# Patient Record
Sex: Female | Born: 1955 | Race: Black or African American | Hispanic: No | Smoking: Current every day smoker
Health system: Southern US, Community
[De-identification: ages and names within clinical notes are randomized; demographics above are authoritative.]

## PROBLEM LIST (undated history)

## (undated) ENCOUNTER — Emergency Department (HOSPITAL_COMMUNITY): Admission: EM | Discharge status: Absent without leave | Payer: No Typology Code available for payment source

## (undated) DIAGNOSIS — I639 Cerebral infarction, unspecified: Secondary | ICD-10-CM

## (undated) DIAGNOSIS — I509 Heart failure, unspecified: Secondary | ICD-10-CM

## (undated) DIAGNOSIS — I219 Acute myocardial infarction, unspecified: Secondary | ICD-10-CM

## (undated) DIAGNOSIS — R011 Cardiac murmur, unspecified: Secondary | ICD-10-CM

## (undated) DIAGNOSIS — I10 Essential (primary) hypertension: Secondary | ICD-10-CM

## (undated) DIAGNOSIS — E785 Hyperlipidemia, unspecified: Secondary | ICD-10-CM

## (undated) HISTORY — DX: Essential (primary) hypertension: I10

## (undated) HISTORY — DX: Acute myocardial infarction, unspecified: I21.9

## (undated) HISTORY — DX: Hyperlipidemia, unspecified: E78.5

## (undated) HISTORY — PX: ABDOMINAL HYSTERECTOMY: SHX81

## (undated) HISTORY — PX: OTHER SURGICAL HISTORY: SHX169

---

## 1995-09-16 HISTORY — PX: OTHER SURGICAL HISTORY: SHX169

## 1998-05-09 ENCOUNTER — Encounter: Admission: RE | Admit: 1998-05-09 | Discharge: 1998-05-09 | Payer: Self-pay | Admitting: Family Medicine

## 1999-04-10 ENCOUNTER — Encounter: Admission: RE | Admit: 1999-04-10 | Discharge: 1999-04-10 | Payer: Self-pay | Admitting: Family Medicine

## 1999-07-17 ENCOUNTER — Encounter: Admission: RE | Admit: 1999-07-17 | Discharge: 1999-07-17 | Payer: Self-pay | Admitting: Family Medicine

## 2000-01-30 ENCOUNTER — Encounter: Admission: RE | Admit: 2000-01-30 | Discharge: 2000-01-30 | Payer: Self-pay | Admitting: Family Medicine

## 2000-06-05 ENCOUNTER — Encounter: Admission: RE | Admit: 2000-06-05 | Discharge: 2000-06-05 | Payer: Self-pay | Admitting: Family Medicine

## 2000-10-23 ENCOUNTER — Encounter: Admission: RE | Admit: 2000-10-23 | Discharge: 2000-10-23 | Payer: Self-pay | Admitting: Family Medicine

## 2001-01-26 ENCOUNTER — Encounter: Admission: RE | Admit: 2001-01-26 | Discharge: 2001-01-26 | Payer: Self-pay | Admitting: Family Medicine

## 2002-05-04 ENCOUNTER — Encounter: Admission: RE | Admit: 2002-05-04 | Discharge: 2002-05-04 | Payer: Self-pay | Admitting: Family Medicine

## 2003-07-07 ENCOUNTER — Encounter: Admission: RE | Admit: 2003-07-07 | Discharge: 2003-07-07 | Payer: Self-pay | Admitting: Family Medicine

## 2003-07-14 ENCOUNTER — Encounter: Admission: RE | Admit: 2003-07-14 | Discharge: 2003-07-14 | Payer: Self-pay | Admitting: Family Medicine

## 2004-01-23 ENCOUNTER — Encounter: Admission: RE | Admit: 2004-01-23 | Discharge: 2004-01-23 | Payer: Self-pay | Admitting: Sports Medicine

## 2004-02-08 ENCOUNTER — Encounter: Admission: RE | Admit: 2004-02-08 | Discharge: 2004-02-08 | Payer: Self-pay | Admitting: Sports Medicine

## 2004-02-15 ENCOUNTER — Encounter: Admission: RE | Admit: 2004-02-15 | Discharge: 2004-02-15 | Payer: Self-pay | Admitting: Sports Medicine

## 2005-09-26 ENCOUNTER — Emergency Department (HOSPITAL_COMMUNITY): Admission: EM | Admit: 2005-09-26 | Discharge: 2005-09-26 | Payer: Self-pay | Admitting: Family Medicine

## 2005-11-12 ENCOUNTER — Ambulatory Visit: Payer: Self-pay | Admitting: Family Medicine

## 2006-11-12 DIAGNOSIS — Z8673 Personal history of transient ischemic attack (TIA), and cerebral infarction without residual deficits: Secondary | ICD-10-CM | POA: Insufficient documentation

## 2006-11-12 DIAGNOSIS — F17200 Nicotine dependence, unspecified, uncomplicated: Secondary | ICD-10-CM

## 2006-11-12 DIAGNOSIS — E785 Hyperlipidemia, unspecified: Secondary | ICD-10-CM

## 2006-11-12 DIAGNOSIS — I6789 Other cerebrovascular disease: Secondary | ICD-10-CM | POA: Insufficient documentation

## 2006-11-12 DIAGNOSIS — F329 Major depressive disorder, single episode, unspecified: Secondary | ICD-10-CM

## 2006-11-19 ENCOUNTER — Telehealth (INDEPENDENT_AMBULATORY_CARE_PROVIDER_SITE_OTHER): Payer: Self-pay | Admitting: *Deleted

## 2006-11-20 ENCOUNTER — Ambulatory Visit: Payer: Self-pay | Admitting: Family Medicine

## 2006-11-20 DIAGNOSIS — I10 Essential (primary) hypertension: Secondary | ICD-10-CM | POA: Insufficient documentation

## 2007-01-14 ENCOUNTER — Encounter: Payer: Self-pay | Admitting: Family Medicine

## 2007-01-14 ENCOUNTER — Ambulatory Visit: Payer: Self-pay | Admitting: Family Medicine

## 2007-01-14 LAB — CONVERTED CEMR LAB
BUN: 11 mg/dL (ref 6–23)
CO2: 25 meq/L (ref 19–32)
Direct LDL: 170 mg/dL — ABNORMAL HIGH
Glucose, Bld: 153 mg/dL — ABNORMAL HIGH (ref 70–99)
Potassium: 3.3 meq/L — ABNORMAL LOW (ref 3.5–5.3)
Sodium: 142 meq/L (ref 135–145)

## 2007-01-15 ENCOUNTER — Encounter: Payer: Self-pay | Admitting: Family Medicine

## 2007-07-20 ENCOUNTER — Ambulatory Visit: Payer: Self-pay | Admitting: Family Medicine

## 2007-07-29 ENCOUNTER — Telehealth (INDEPENDENT_AMBULATORY_CARE_PROVIDER_SITE_OTHER): Payer: Self-pay | Admitting: Family Medicine

## 2008-06-02 ENCOUNTER — Emergency Department (HOSPITAL_COMMUNITY): Admission: EM | Admit: 2008-06-02 | Discharge: 2008-06-02 | Payer: Self-pay | Admitting: Family Medicine

## 2008-06-15 ENCOUNTER — Ambulatory Visit: Payer: Self-pay | Admitting: Family Medicine

## 2008-06-15 DIAGNOSIS — E663 Overweight: Secondary | ICD-10-CM | POA: Insufficient documentation

## 2008-06-19 ENCOUNTER — Telehealth (INDEPENDENT_AMBULATORY_CARE_PROVIDER_SITE_OTHER): Payer: Self-pay | Admitting: *Deleted

## 2008-12-21 ENCOUNTER — Ambulatory Visit: Payer: Self-pay | Admitting: Family Medicine

## 2008-12-21 ENCOUNTER — Encounter (INDEPENDENT_AMBULATORY_CARE_PROVIDER_SITE_OTHER): Payer: Self-pay | Admitting: Family Medicine

## 2008-12-21 LAB — CONVERTED CEMR LAB
Albumin: 4.2 g/dL (ref 3.5–5.2)
BUN: 14 mg/dL (ref 6–23)
CO2: 28 meq/L (ref 19–32)
Calcium: 9.5 mg/dL (ref 8.4–10.5)
Chloride: 102 meq/L (ref 96–112)
Cholesterol: 191 mg/dL (ref 0–200)
Creatinine, Ser: 1.02 mg/dL (ref 0.40–1.20)
Glucose, Bld: 109 mg/dL — ABNORMAL HIGH (ref 70–99)
HDL: 29 mg/dL — ABNORMAL LOW (ref >39)
Potassium: 3.3 meq/L — ABNORMAL LOW (ref 3.5–5.3)
Total CHOL/HDL Ratio: 6.6
Triglycerides: 174 mg/dL — ABNORMAL HIGH (ref <150)

## 2008-12-27 ENCOUNTER — Encounter (INDEPENDENT_AMBULATORY_CARE_PROVIDER_SITE_OTHER): Payer: Self-pay | Admitting: Family Medicine

## 2009-03-26 ENCOUNTER — Encounter: Payer: Self-pay | Admitting: Family Medicine

## 2009-04-30 ENCOUNTER — Telehealth: Payer: Self-pay | Admitting: Family Medicine

## 2009-05-11 ENCOUNTER — Ambulatory Visit: Payer: Self-pay | Admitting: Family Medicine

## 2009-05-11 DIAGNOSIS — E119 Type 2 diabetes mellitus without complications: Secondary | ICD-10-CM

## 2009-05-11 DIAGNOSIS — E1165 Type 2 diabetes mellitus with hyperglycemia: Secondary | ICD-10-CM | POA: Insufficient documentation

## 2009-05-11 LAB — CONVERTED CEMR LAB: Hgb A1c MFr Bld: 7.4 %

## 2009-05-15 ENCOUNTER — Telehealth: Payer: Self-pay | Admitting: *Deleted

## 2009-06-07 ENCOUNTER — Encounter: Payer: Self-pay | Admitting: Family Medicine

## 2009-09-05 ENCOUNTER — Telehealth: Payer: Self-pay | Admitting: *Deleted

## 2009-09-24 ENCOUNTER — Encounter: Payer: Self-pay | Admitting: Family Medicine

## 2009-10-10 ENCOUNTER — Inpatient Hospital Stay (HOSPITAL_COMMUNITY): Admission: EM | Admit: 2009-10-10 | Discharge: 2009-10-14 | Payer: Self-pay | Admitting: Family Medicine

## 2009-10-10 ENCOUNTER — Ambulatory Visit: Payer: Self-pay | Admitting: Family Medicine

## 2009-10-10 ENCOUNTER — Encounter: Payer: Self-pay | Admitting: Family Medicine

## 2009-10-10 ENCOUNTER — Encounter: Payer: Self-pay | Admitting: Emergency Medicine

## 2009-10-12 ENCOUNTER — Encounter: Payer: Self-pay | Admitting: Family Medicine

## 2009-10-16 ENCOUNTER — Telehealth: Payer: Self-pay | Admitting: Family Medicine

## 2009-10-17 ENCOUNTER — Encounter: Payer: Self-pay | Admitting: Family Medicine

## 2009-10-17 ENCOUNTER — Ambulatory Visit: Payer: Self-pay | Admitting: Family Medicine

## 2009-10-26 ENCOUNTER — Encounter: Payer: Self-pay | Admitting: Family Medicine

## 2009-10-30 ENCOUNTER — Ambulatory Visit: Payer: Self-pay | Admitting: Psychology

## 2009-11-01 ENCOUNTER — Telehealth: Payer: Self-pay | Admitting: *Deleted

## 2009-11-06 ENCOUNTER — Ambulatory Visit: Payer: Self-pay | Admitting: Family Medicine

## 2009-11-06 ENCOUNTER — Telehealth: Payer: Self-pay | Admitting: *Deleted

## 2009-11-19 ENCOUNTER — Encounter: Admission: RE | Admit: 2009-11-19 | Discharge: 2009-11-19 | Payer: Self-pay | Admitting: Family Medicine

## 2010-01-29 ENCOUNTER — Telehealth: Payer: Self-pay | Admitting: Family Medicine

## 2010-04-18 ENCOUNTER — Ambulatory Visit: Payer: Self-pay | Admitting: Family Medicine

## 2010-04-18 ENCOUNTER — Encounter: Payer: Self-pay | Admitting: Family Medicine

## 2010-04-18 LAB — CONVERTED CEMR LAB: Hgb A1c MFr Bld: 6.9 %

## 2010-04-20 LAB — CONVERTED CEMR LAB
BUN: 14 mg/dL (ref 6–23)
CO2: 30 meq/L (ref 19–32)
Calcium: 9.4 mg/dL (ref 8.4–10.5)
Chloride: 103 meq/L (ref 96–112)
Creatinine, Ser: 1.21 mg/dL — ABNORMAL HIGH (ref 0.40–1.20)
Glucose, Bld: 130 mg/dL — ABNORMAL HIGH (ref 70–99)
Potassium: 3.4 meq/L — ABNORMAL LOW (ref 3.5–5.3)
Sodium: 142 meq/L (ref 135–145)

## 2010-04-24 ENCOUNTER — Encounter: Payer: Self-pay | Admitting: Family Medicine

## 2010-04-24 ENCOUNTER — Ambulatory Visit: Payer: Self-pay | Admitting: Family Medicine

## 2010-04-24 ENCOUNTER — Telehealth: Payer: Self-pay | Admitting: Family Medicine

## 2010-04-24 LAB — CONVERTED CEMR LAB
BUN: 13 mg/dL (ref 6–23)
CO2: 26 meq/L (ref 19–32)
Calcium: 8.7 mg/dL (ref 8.4–10.5)
Chloride: 109 meq/L (ref 96–112)
Cholesterol: 91 mg/dL (ref 0–200)
Creatinine, Ser: 1.07 mg/dL (ref 0.40–1.20)
Glucose, Bld: 96 mg/dL (ref 70–99)
HDL: 26 mg/dL — ABNORMAL LOW (ref >39)
LDL Cholesterol: 53 mg/dL (ref 0–99)
Potassium: 3.4 meq/L — ABNORMAL LOW (ref 3.5–5.3)
Sodium: 143 meq/L (ref 135–145)
Total CHOL/HDL Ratio: 3.5
Triglycerides: 58 mg/dL (ref <150)
VLDL: 12 mg/dL (ref 0–40)

## 2010-04-28 ENCOUNTER — Encounter: Payer: Self-pay | Admitting: Family Medicine

## 2010-06-18 ENCOUNTER — Encounter: Payer: Self-pay | Admitting: Family Medicine

## 2010-10-09 ENCOUNTER — Telehealth: Payer: Self-pay | Admitting: Family Medicine

## 2010-10-17 NOTE — Assessment & Plan Note (Signed)
Summary: f/up,tcb   Vital Signs:  Patient profile:   55 year old female Weight:      180.1 pounds Temp:     97.9 degrees F oral Pulse rate:   55 / minute BP sitting:   121 / 84  (left arm)  Vitals Entered By: Alphia Kava (May 11, 2009 11:35 AM) CC: f/u BP Is Patient Diabetic? No   Primary Care Provider:  Helane Rima DO  CC:  f/u BP.  History of Present Illness: 55yo with h/o HTN, HLD, and h/o stroke.  1. HTN: Rx lisinopril, diltiazem, and maxzide. BP at goal. Denies CP, SOB, or leg swelling.    2.  H/O CVA: Rx ASA.  3.  HLD: states that she was told to take medication, but no Rx sent to pharmacy.   4.  Overweight: active, but does not dedicate time to exercise.  Daughter tries to encourage her.  5.  Tobacco abuse: 1ppd, not interested in quitting.  6. Health Maintenance: due for mammogram. previous BMPs with elevated glucose, will check A1c.  Habits & Providers  Alcohol-Tobacco-Diet     Tobacco Status: current     Cigarette Packs/Day: 1.0  Current Medications (verified): 1)  Maxzide 75-50 Mg Tabs (Triamterene-Hctz) .... Take 1 Tablet By Mouth Once A Day- Make Appointment With Your Doctor Before Any More Refills 2)  Taztia Xt 240 Mg  Cp24 (Diltiazem Hcl Er Beads) .Marland Kitchen.. 1 Tablet By Mouth Daily 3)  Lisinopril 10 Mg  Tabs (Lisinopril) .Marland Kitchen.. 1 Tab By Mouth Daily.  Please Schedule Appointment With Doctor For Blood Pressure Check. 4)  Anacin 81 Mg Tbec (Aspirin) .Marland Kitchen.. 1 Tablet By Mouth Daily 5)  Lovastatin 20 Mg Tabs (Lovastatin) .... Take 1 Tab By Mouth Daily  Allergies (verified): No Known Drug Allergies  Review of Systems General:  Denies chills, fever, loss of appetite, and malaise. CV:  Complains of fatigue; denies chest pain or discomfort, lightheadness, palpitations, shortness of breath with exertion, and swelling of feet. Resp:  Denies cough, shortness of breath, sputum productive, and wheezing.  Physical Exam  General:  Well-developed,well-nourished,in no  acute distress; alert,appropriate and cooperative throughout examination. Vital signs reviewed. Lungs:  Normal respiratory effort, chest expands symmetrically. Lungs are clear to auscultation, no crackles or wheezes. Heart:  Normal rate and regular rhythm. S1 and S2 normal without gallop, murmur, click, rub or other extra sounds. Pulses:  2 + dp Extremities:  no edema Psych:  Oriented X3, memory intact for recent and remote, normally interactive, and good eye contact.     Impression & Recommendations:  Problem # 1:  HYPERTENSION, BENIGN ESSENTIAL (ICD-401.1) Assessment Unchanged  At goal. Continue current treament. Her updated medication list for this problem includes:    Maxzide 75-50 Mg Tabs (Triamterene-hctz) .Marland Kitchen... Take 1 tablet by mouth once a day- make appointment with your doctor before any more refills    Taztia Xt 240 Mg Cp24 (Diltiazem hcl er beads) .Marland Kitchen... 1 tablet by mouth daily    Lisinopril 10 Mg Tabs (Lisinopril) .Marland Kitchen... 1 tab by mouth daily.  please schedule appointment with doctor for blood pressure check.  Orders: [facility]- Est  Level 4 ([address])  Problem # 2:  HYPERLIPIDEMIA (ICD-272.4) Assessment: Deteriorated  Restart Lovastatin. Recheck 6 months (patient refuses to come back in 3 months). Her updated medication list for this problem includes:    Lovastatin 20 Mg Tabs (Lovastatin) .Marland Kitchen... Take 1 tab by mouth daily  Orders: [facility]- Est  Level 4 ([address])  Problem # 3:  HYPERGLYCEMIA (ICD-790.29) Assessment: New Check A1c. Orders: A1C-[facility] ([address]) [facility]- Est  Level 4 ([address])  Problem # 4:  OVERWEIGHT (ICD-278.02) Assessment: Unchanged  Discussed benefit of exercise, healthy diet, and weight loss in controlling her chronic diseases. Encourage her to try Wii Fit with her family since they already use it at home.   Orders: [facility]- Est  Level 4 ([address])  Problem # 5:  CVA (ICD-436) Assessment: Unchanged  Continue ASA.  Orders: [facility]- Est  Level 4 ([address])  Problem # 6:   Preventive Health Care (ICD-V70.0) Assessment: Unchanged Encouraged Mammogram.  Problem # 7:  TOBACCO DEPENDENCE (ICD-305.1) Assessment: Unchanged Counseled to quit.  Precontemplative. Orders: [facility]- Est  Level 4 ([address])  Complete Medication List: 1)  Maxzide 75-50 Mg Tabs (Triamterene-hctz) .... Take 1 tablet by mouth once a day- make appointment with your doctor before any more refills 2)  Taztia Xt 240 Mg Cp24 (Diltiazem hcl er beads) .Marland Kitchen.. 1 tablet by mouth daily 3)  Lisinopril 10 Mg Tabs (Lisinopril) .Marland Kitchen.. 1 tab by mouth daily.  please schedule appointment with doctor for blood pressure check. 4)  Anacin 81 Mg Tbec (Aspirin) .Marland Kitchen.. 1 tablet by mouth daily 5)  Lovastatin 20 Mg Tabs (Lovastatin) .... Take 1 tab by mouth daily  Patient Instructions: 1)  It was very nice to meet you today! 2)  I will let you know if your A1c is abnormal. 3)  Please start the cholesterol medication that I have sent to your pharmacy. 4)  Get your mammogram! 5)  Follow up in 6 months. We will check your labs again at that time, so please come fasting (make your appoinment early in the morning and do no at breakfast).  Prescriptions: LOVASTATIN 20 MG TABS (LOVASTATIN) Take 1 tab by mouth daily  #90 x 3   Entered and Authorized by:   Helane Rima MD   Signed by:   Helane Rima MD on 05/11/2009   Method used:   Electronically to        [facility]  [phone]* (retail)       [address]       [place], [place]  [address]       Ph: [phone] or [phone]       Fax: [phone]   RxID:   [id number]    Prevention & Chronic Care Immunizations   Influenza vaccine: Fluvax Non-MCR  (06/15/2008)   Influenza vaccine due: Refused  (06/15/2008)    Tetanus booster: 01/18/2004: Done.   Tetanus booster due: 01/17/2014    Pneumococcal vaccine: Not documented  Colorectal Screening   Hemoccult: Not documented    Colonoscopy: Not documented   Colonoscopy due:  Refused  (06/15/2008)  Other Screening   Pap smear: hysterectomy for fibroids  (06/15/2008)   Pap smear due: Not Indicated    Mammogram: Done.  (01/18/2004)   Mammogram due: 01/17/2005   Smoking status: current  (05/11/2009)   Smoking cessation counseling: yes  (11/20/2006)  Lipids   Total Cholesterol: 191  (12/21/2008)   LDL: 127  (12/21/2008)   LDL Direct: 170  (01/14/2007)   HDL: 29  (12/21/2008)   Triglycerides: 174  (12/21/2008)    SGOT (AST): 18  (12/21/2008)   SGPT (ALT): 20  (12/21/2008)   Alkaline phosphatase: 73  (12/21/2008)   Total bilirubin: 0.5  (12/21/2008)    Lipid flowsheet reviewed?: Yes   Progress toward LDL goal: Improved  Hypertension   Last Blood Pressure:  121 / 84  (05/11/2009)   Serum creatinine: 1.02  (12/21/2008)   Serum potassium 3.3  (12/21/2008)    Hypertension flowsheet reviewed?: Yes   Progress toward BP goal: At goal  Self-Management Support :    Patient will work on the following items until the next clinic visit to reach self-care goals:     Eating: drink diet soda or water instead of juice or soda, eat more vegetables, use fresh or frozen vegetables, eat foods that are low in salt, eat baked foods instead of fried foods, eat fruit for snacks and desserts  (05/11/2009)     Activity: take a 30 minute walk every day, take the stairs instead of the elevator, park at the far end of the parking lot  (05/11/2009)    Hypertension self-management support: Written self-care plan  (05/11/2009)   Hypertension self-care plan printed.    Lipid self-management support: Written self-care plan  (05/11/2009)   Lipid self-care plan printed.   Laboratory Results   Blood Tests   Date/Time Received: May 11, 2009 12:02 PM  Date/Time Reported: May 11, 2009 12:18 PM   HGBA1C: 7.4%   (Normal Range: Non-Diabetic - 3-6%   Control Diabetic - 6-8%)  Comments: ...............test performed by......Marland KitchenBonnie A. [place], [place] (ASCP)      Appended  Document: f/up,tcb HgbA1c resulting in new diabetes diagnosis. Patient asked to schedule an office visit to discuss this new diagnosis.

## 2010-10-17 NOTE — Progress Notes (Signed)
Summary: re: meds refil/ts  Phone Note Call from Patient Call back at Home Phone [phone]   Caller: Patient Summary of Call: Pt says she recieved message from  office stating that Dr. Earlene Plater had called in some medication for her.  Pt not aware of anything that she was needing. Initial call taken by: Clydell Hakim,  November 01, 2009 9:54 AM  Follow-up for Phone Call        called pt back and informed, that dr.wallace sent refills to her pharmacy. Follow-up by: Arlyss Repress CMA,,  November 01, 2009 1:55 PM

## 2010-10-17 NOTE — Assessment & Plan Note (Signed)
Summary: Cath Report   [name], Suarez              ACCOUNT NO.:  [id number]      MEDICAL RECORD NO.:  [id number]          PATIENT TYPE:  INP      LOCATION:  2927                         FACILITY:  [facility]      PHYSICIAN:  Nicki Guadalajara, M.D.     DATE OF BIRTH:  Aug 16, 1956      DATE OF PROCEDURE:  10/11/2009   DATE OF DISCHARGE:                               CARDIAC CATHETERIZATION      PROCEDURE:  Cardiac catheterization.      OPERATOR:  Nicki Guadalajara, MD      INDICATIONS:  [name] is a 55 year old African American   female who has a history of hypertension and hyperlipidemia.  The   patient had a remote history of a CVA in 1997 and also history of brain   aneurysm status post repair in 1990.  She has history of hypertension,   tobacco use, and in August 2010 was diagnosed with diabetes mellitus.   She presented to [facility] yesterday with chest pain which had   awakened her from sleep the morning previously.  She was admitted to   [facility].  She was seen for initial consultation cardiac-wise   by Dr. Clarene Duke.  She subsequently has ruled in for non-STEMI with T-wave   inversion inferolaterally and a CPK at 367, MB 38.4, and troponin 6.2.   She was significantly hypokalemic and has required potassium repletion.   She is now referred for cardiac catheterization and possible   intervention.      DESCRIPTION OF PROCEDURE:  After premedication with Valium 1 mg   initially since the patient was sedated from the oral Valium, right   femoral artery was punctured anteriorly and a 5-French sheath was   inserted.  Diagnostic catheterization was done utilizing 5-French   Judkins 4 left and right coronary catheters.  A 5-French pigtail   catheter was used for biplane cine left ventriculography.  With her risk   factors, diabetes, and hypertension, distal aortography was also   performed.      With the demonstration of a totally occluded RCA in the region of  the   crux with very faint visualization distally, a decision was made to   attempt intervention.  It was felt that this was potentially a small   vessel and in light of her history of cerebral aneurysm as well as CVA,   she may not be a candidate for long-term Plavix therapy.  The arterial   sheath was upgraded to a 6-French system.  She was given only 300 mg of   Plavix because of the CVA history and was started on bivalirudin.   Initially, an FR-4 with side hole 6-French catheter was inserted but due   to the anterior takeoff of the RCA, this was changed to a 3DR 6-French   catheter with side holes.  After documentation of therapeutic   anticoagulation, a Prowater wire was advanced down the RCA.  The patient   was also started on IV nitroglycerin due to hypertension.  Initially,   the wire was unable to cross the stenosis with an occlusion on its own.   Consequently, a 2.0 x 12 mm apex balloon was inserted.  This did provide   some support and ultimately the wire was able to cross the total   occlusion and was advanced down a smaller PDA vessel.  Multiple   dilatations were made with this 2-0 balloon.  This did open up the flow   down the PDA, but it was apparent that the RCA was still occluded just   after the PDA takeoff.  The wire was then pulled back into this area and   actually the wire initially seemed to go into a distal continuation   branch.  The 2-0 balloon was then advanced to this site of total   occlusion but this was not able to cross the total occlusion.  Low level   inflations were made just proximal to the occlusion and then it became   apparent that there was a third larger branch in between the PDA and the   continuation branch which was a larger size PDA segment.  The wire was   ultimately able to cross this occluded segment into this larger PDA or   inferior LV branch and dilatation was made at the subtotal stenosis in   this segment.  A second wire was then  advanced and ultimately placed   down the initial PDA vessel.  The 2-0 balloon was then removed and a 2.5   x 20 mm Voyager balloon was inserted with dilatation proximal to the   bifurcation of the distal RCA to cover the long areas of narrowing that   were high grade.  Several long inflations were made at 3, 4, and 5   atmospheres.  It was then apparent that there was still the occluded   segment in the distal RCA.  A third wire was then inserted, this being a   whisper wire which ultimately was able to cross the total occlusion.  A   2-0 balloon was then reinserted but again this was unable to cross the   total occlusion.  This balloon was then removed and a 1.5 x 12 mm   balloon was inserted with dilatation at this site.  This did result in   some antegrade flow down the smaller continuation branch, PLA like   system.  Again, there was some narrowing in the acute margin crux   proximal to the now apparent trifurcation and the 2.5 balloon was then   reinserted for long inflations.  Due to the very long lesion length,   smaller vessel size, and the concern for long-term Plavix in this   patient with prior cerebral aneurysm and CVA, the decision was made not   to stent this segment but just to balloon angioplasty.  On completion of   the procedure, the RCA was opened and the tandem 100% occlusions in the   crux were reduced to less than 40%.  The PDA 100% occlusion was reduced   to 10-20%.  There was very distal narrowing in very small distal portion   of this PDA which was too small to intervene upon.  The larger second   PDA or the inferiorly branch had 99-100% occlusion being reduced to 0%.   The continuation branch site of occlusion still remained narrowed and   was small-caliber but with some antegrade flow now and had residual   narrowing of approximately 80% at the  site of occlusion.  There was   careful attention made not to dissect the vessel.  At the completion of   the  procedure, the patient was pain-free and had stable hemodynamics.   During the procedure, she received numerous doses of intracoronary   nitroglycerin and IV nitroglycerin was titrated up to 40 mcg.      HEMODYNAMIC DATA:  Central aortic pressure was 166/89.  Left ventricular   pressure 166/10.      ANGIOGRAPHIC DATA:  Left main coronary artery was angiographically   normal and bifurcated into an LAD and left circumflex system.      The LAD was lumpy bumpy proximally in its mid segment with narrowing of   30%.  The vessels reached the LV apex and supplied the distal portion of   the inferior wall.      The circumflex was somewhat tortuous and had proximal 30% narrowing   before the OM-1 vessel.  There was 30% narrowing in the proximal   circumflex.      The right coronary artery was totally occluded after an acute marginal   branch.      Biplane cine left ventriculography revealed preserved global   contractility with only a small focal region of mid inferior   hypocontractility on the RAO projection and low-to-mid posterolateral   hypocontractility on the LAO projection.      Distal aortography revealed patent renal arteries without evidence for   aortic aneurysm.  There did appear to be mild dilatation of the iliacs   bilaterally.      Following long complex 3 wire PTCA with ultimate opening of this   diffusely diseased distal RCA into 3 branches, the subtotal and   subsequent total portion in the acute margin and beyond was reduced to   40%.  The vessel then gave rise to a first PDA which was initially 100%   occluded and was reduced to 10%.  The  second larger PDA where it was   100% occluded reduced to 10% and a small continuation branch which was   100% occluded and opened partially with residual narrowing in the small   segment of at least 80%.      IMPRESSION:   1. Preserved global contractility with focal mid inferior and low-to-       mid posterolateral  hypocontractility in this patient status post       non-ST elevation myocardial infarction and inferolateral wall       myocardial infarction.   2. Proximal-to-mid irregularity of the left anterior descending of 30%       and 30% mid left anterior descending stenosis.   3. A 30-40% proximal circumflex and obtuse marginal 1 stenosis.   4. 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 in the       region of the acute margin treated with ultimate percutaneous       transluminal coronary angioplasty alone with simultaneous       ultimately 3 wires inserted and 99-100% occlusions in the right       coronary artery in the region of the acute margin and proximal to       the distal trifurcation from 99-100% to 30-40%, the 100% posterior       descending artery occlusion to 10%, the 100% posterior descending       artery 2 or larger vessel occlusion being reduced from 100% to 10%,       and the continuation  branch 100% occlusion still with residual       narrowing of 80% in a small-caliber vessel.      RECOMMENDATIONS:  Medical therapy with aggressive statin therapy, risk   factor modification, and smoking cessation.                  ______________________________   Nicki Guadalajara, M.D.            TK/MEDQ  D:  10/11/2009  T:  10/12/2009  Job:  [id number]

## 2010-10-17 NOTE — Progress Notes (Signed)
Summary: Medication Change       New/Updated Medications: BUDEPRION XL 300 MG XR24H-TAB (BUPROPION HCL) one by mouth each am Prescriptions: BUDEPRION XL 300 MG XR24H-TAB (BUPROPION HCL) one by mouth each am  #30 x 3   Entered and Authorized by:   Helane Rima DO   Signed by:   Helane Rima DO on 04/24/2010   Method used:   Electronically to        [facility] [address] (retail)       [phone]       [place], [place]  [address]       Ph: [phone]       Fax: [phone]   RxID:   [id number]

## 2010-10-17 NOTE — Letter (Signed)
Summary: Generic Letter  [facility]  [address]   [place], [place] [address]   Phone: [phone]  Fax: [phone]    04/28/2010  LAYTOYA ION [address] [place], [place]  [address]  Dear Ms. Dwiggins,  I am happy to tell you that your recent labs were all normal, with the exception of a low HDL cholesterol. This is your "good" cholesterol and we want that number to be high for heart protection. To raise your good cholesterol, I recommend that you stop smoking. Let me know if you have any questions or concerns.   Sincerely,   Helane Rima DO  Appended Document: Generic Letter mailed

## 2010-10-17 NOTE — Assessment & Plan Note (Signed)
Summary: f/up,tcb   Vital Signs:  Patient profile:   55 year old female Height:      69.5 inches Weight:      175.6 pounds BMI:     25.65 Pulse rate:   70 / minute BP sitting:   140 / 83  (left arm) Cuff size:   regular  Vitals Entered By: Arlyss Repress CMA, (April 18, 2010 10:00 AM) CC: refill all meds, HTN, HLD, DM, tobacco Is Patient Diabetic? No Pain Assessment Patient in pain? no        Primary Care Provider:  Helane Rima DO  CC:  refill all meds, HTN, HLD, DM, and tobacco.  History of Present Illness: 55 year old AAF:  1. HTN: Rx Lisinopril, Triamterene. Denies CP, SOB, N/V/D, LE edema, HA, dizziness.  2. HLD: Rx Crestor. Denies myalgias.  3. DM: New Dx. A1c = 7.4 to 6.9 today. Rx Metformin. Denies GI c/o.  4. Tobacco Abuse: Still smoking 1/2 ppd. Working on quitting with Smoking Cessation Class. Now on Wellbutrin.  5. CAD: recent NSTEMI.  Angioplasty was performed by cardiology. The patient's medications were reviewed and changed by cardiology. The patient is followed by [facility] - Dr. Clarene Duke.  Habits & Providers  Alcohol-Tobacco-Diet     Tobacco Status: current     Tobacco Counseling: to quit use of tobacco products     Cigarette Packs/Day: 0.5  Comments: on meds, but not working  Current Medications (verified): 1)  Lisinopril 20 Mg Tabs (Lisinopril) .Marland Kitchen.. 1 Tablet By Mouth Daily 2)  Aspirin 325 Mg Tabs (Aspirin) .... Take 1 Tab By Mouth Every Day 3)  Plavix 75 Mg Tabs (Clopidogrel Bisulfate) .... Take 1 Tab By Mouth Daily 4)  Nitrostat 0.4 Mg Subl (Nitroglycerin) .... One Sl At First Sign of Chest Pain, May Repeat Every Five Minutes X 2 5)  Crestor 20 Mg Tabs (Rosuvastatin Calcium) .Marland Kitchen.. 1 Tablet By Mouth Daily 6)  Dyrenium 50 Mg Caps (Triamterene) .... One By Mouth Daily 7)  Glucophage Xr 500 Mg Xr24h-Tab (Metformin Hcl) .... 2 By Mouth Daily 8)  Bupropion Hcl 150 Mg Xr12h-Tab (Bupropion Hcl) .... Take 1 Tablet Daily For 3  Days, Then Start Taking 1 Tablet Twice Daily.  Allergies (verified): No Known Drug Allergies  Past History:  Past Medical History: Last updated: 10/17/2009  1. CVA in 1997.   2. History of brain aneurysm status post repair in 1990.   3. Hypertension.   4. Hyperlipidemia.   5. New diagnosis of diabetes mellitus type 2 in August of 2010 with hemoglobin A1c of 7.4.   6. Tobacco abuse.   Social History: Packs/Day:  0.5  Review of Systems      See HPI  Physical Exam  General:  Well-developed,well-nourished,in no acute distress; alert,appropriate and cooperative throughout examination. Vitals reviewed. Lungs:  Normal respiratory effort, chest expands symmetrically. Lungs are clear to auscultation, no crackles or wheezes. Heart:  RRR 2/6 SM. Pulses:  2+ DP. Extremities:  No LE edema or cyanosis.  Psych:  Oriented X3, memory intact for recent and remote, and normally interactive.     Impression & Recommendations:  Problem # 1:  HYPERTENSION, BENIGN ESSENTIAL (ICD-401.1) Assessment Unchanged Amlodipine recently DC's by Cardiology. Will check K, Cr. Her updated medication list for this problem includes:    Lisinopril 20 Mg Tabs (Lisinopril) .Marland Kitchen... 1 tablet by mouth daily    Dyrenium 50 Mg Caps (Triamterene) ..... One by mouth daily  Orders: Basic Met-[facility] [phone])  [facility]- Est  Level 4 ([address])Future Orders: Lipid-[facility] ([id number]) ... 04/25/2011  Problem # 2:  HYPERLIPIDEMIA (ICD-272.4) Assessment: Unchanged  Her updated medication list for this problem includes:    Crestor 20 Mg Tabs (Rosuvastatin calcium) .Marland Kitchen... 1 tablet by mouth daily  Orders: [facility]- Est  Level 4 ([address])Future Orders: Lipid-[facility] ([id number]) ... 04/25/2011  Problem # 3:  DIABETES MELLITUS, TYPE II (ICD-250.00) Assessment: Improved  Her updated medication list for this problem includes:    Lisinopril 20 Mg Tabs (Lisinopril) .Marland Kitchen... 1 tablet by mouth daily    Aspirin 325 Mg Tabs (Aspirin) .Marland Kitchen... Take 1  tab by mouth every day    Glucophage Xr 500 Mg Xr24h-tab (Metformin hcl) .Marland Kitchen... 2 by mouth daily  Orders: A1C-[facility] ([address]) [facility]- Est  Level 4 ([address]86)  Problem # 4:  TOBACCO DEPENDENCE (ICD-305.1) Assessment: Unchanged  Advised to quit.  Orders: [facility]- Est  Level 4 ([address])  Complete Medication List: 1)  Lisinopril 20 Mg Tabs (Lisinopril) .Marland Kitchen.. 1 tablet by mouth daily 2)  Aspirin 325 Mg Tabs (Aspirin) .... Take 1 tab by mouth every day 3)  Plavix 75 Mg Tabs (Clopidogrel bisulfate) .... Take 1 tab by mouth daily 4)  Nitrostat 0.4 Mg Subl (Nitroglycerin) .... One sl at first sign of chest pain, may repeat every five minutes x 2 5)  Crestor 20 Mg Tabs (Rosuvastatin calcium) .Marland Kitchen.. 1 tablet by mouth daily 6)  Dyrenium 50 Mg Caps (Triamterene) .... One by mouth daily 7)  Glucophage Xr 500 Mg Xr24h-tab (Metformin hcl) .... 2 by mouth daily 8)  Bupropion Hcl 150 Mg Xr12h-tab (Bupropion hcl) .... Take 1 tablet daily for 3 days, then start taking 1 tablet twice daily.  Patient Instructions: 1)  It was nice to see today! Prescriptions: BUPROPION HCL 150 MG XR12H-TAB (BUPROPION HCL) Take 1 tablet daily for 3 days, then start taking 1 tablet twice daily.  #60 x 3   Entered and Authorized by:   Helane Rima DO   Signed by:   Helane Rima DO on 04/18/2010   Method used:   Electronically to        [facility] [address] (retail)       [phone]       [place], [place]  [address]       Ph: [phone]       Fax: [phone]   RxID:   [id number] NITROSTAT 0.4 MG SUBL (NITROGLYCERIN) one SL at first sign of chest pain, may repeat every five minutes x 2  #20 x 0   Entered and Authorized by:   Helane Rima DO   Signed by:   Helane Rima DO on 04/18/2010   Method used:   Electronically to        [facility] [address] (retail)       [phone]       [place], [place]  [address]       Ph: [phone]       Fax:  [phone]   RxID:   [id number] PLAVIX 75 MG TABS (CLOPIDOGREL BISULFATE) Take 1 tab by mouth daily  #30 x 1   Entered and Authorized by:   Helane Rima DO   Signed by:   Helane Rima DO on 04/18/2010   Method used:   Electronically to        [facility] [address] (retail)  [address]       [place], [place]  [address]       Ph: [phone]       Fax: [phone]   RxID:   [id number] LISINOPRIL 20 MG TABS (LISINOPRIL) 1 tablet by mouth daily  #30 x 1   Entered and Authorized by:   Helane Rima DO   Signed by:   Helane Rima DO on 04/18/2010   Method used:   Electronically to        [facility] [address] (retail)       [phone]       [place], [place]  [address]       Ph: [phone]       Fax: [phone]   RxID:   [id number] GLUCOPHAGE XR 500 MG XR24H-TAB (METFORMIN HCL) 2 by mouth daily  #60 x 3   Entered and Authorized by:   Helane Rima DO   Signed by:   Helane Rima DO on 04/18/2010   Method used:   Electronically to        [facility] [address] (retail)       [phone]       [place], [place]  [address]       Ph: [phone]       Fax: [phone]   RxID:   [id number] CRESTOR 20 MG TABS (ROSUVASTATIN CALCIUM) 1 tablet by mouth daily  #90 x 3   Entered and Authorized by:   Helane Rima DO   Signed by:   Helane Rima DO on 04/18/2010   Method used:   Electronically to        [facility] [address] (retail)       [phone]       [place], [place]  [address]       Ph: [phone]       Fax: [phone]   RxID:   [id number] DYRENIUM 50 MG CAPS (TRIAMTERENE) one by mouth daily  #30 x 3   Entered and Authorized by:   Helane Rima DO   Signed by:   Helane Rima DO on 04/18/2010   Method used:   Electronically to        [facility] [address] (retail)       [phone]       [place], [place]  [address]       Ph: [phone]       Fax: [phone]   RxID:   [id number]   Laboratory Results   Blood Tests   Date/Time Received: April 18, 2010 10:43 AM  Date/Time Reported: April 18, 2010 11:35 AM   HGBA1C: 6.9%   (Normal Range: Non-Diabetic - 3-6%   Control Diabetic - 6-8%)  Comments: ...............test performed by......Marland KitchenBonnie A. [place], MLS (ASCP)cm      Prevention & Chronic Care Immunizations   Influenza vaccine: Fluvax Non-MCR  (06/15/2008)   Influenza vaccine deferral: Not indicated  (10/28/2009)   Influenza vaccine due: 05/17/2011    Tetanus booster: 01/18/2004: Done.   Tetanus booster due: 01/17/2014    Pneumococcal vaccine: Not documented  Colorectal Screening   Hemoccult: Not documented    Colonoscopy: Not documented   Colonoscopy due: Refused  (06/15/2008)  Other Screening  Pap smear: hysterectomy for fibroids  (06/15/2008)   Pap smear due: Not Indicated    Mammogram: Done.  (01/18/2004)   Mammogram due: 01/17/2005   Smoking status: current  (04/18/2010)   Smoking cessation counseling: yes  (11/20/2006)   Target quit date: 11/14/2009  (11/06/2009)  Diabetes Mellitus   HgbA1C: 6.9  (04/18/2010)    Eye exam: Not documented    Foot exam: Not documented   High risk foot: Not documented   Foot care education: Not documented    Urine microalbumin/creatinine ratio: Not documented    Diabetes flowsheet reviewed?: Yes   Progress toward A1C goal: At goal  Lipids   Total Cholesterol: 191  (12/21/2008)   LDL: 127  (12/21/2008)   LDL Direct: 170  (01/14/2007)   HDL: 29  (12/21/2008)   Triglycerides: 174  (12/21/2008)    SGOT (AST): 18  (12/21/2008)   SGPT (ALT): 20  (12/21/2008)   Alkaline phosphatase: 73  (12/21/2008)   Total bilirubin: 0.5  (12/21/2008)    Lipid flowsheet reviewed?: Yes   Progress toward LDL goal: Unchanged  Hypertension   Last Blood Pressure: 140 /  83  (04/18/2010)   Serum creatinine: 1.02  (12/21/2008)   Serum potassium 3.3  (12/21/2008)    Hypertension flowsheet reviewed?: Yes   Progress toward BP goal: At goal  Self-Management Support :   Personal Goals (by the next clinic visit) :     Personal A1C goal: 6  (10/28/2009)     Personal blood pressure goal: 130/80  (10/28/2009)     Personal LDL goal: 70  (10/28/2009)    Patient will work on the following items until the next clinic visit to reach self-care goals:     Medications and monitoring: take my medicines every day, bring all of my medications to every visit  (04/18/2010)     Eating: drink diet soda or water instead of juice or soda, eat more vegetables, use fresh or frozen vegetables, eat foods that are low in salt, eat baked foods instead of fried foods, eat fruit for snacks and desserts, limit or avoid alcohol  (04/18/2010)     Activity: take a 30 minute walk every day, take the stairs instead of the elevator, park at the far end of the parking lot  (04/18/2010)    Diabetes self-management support: Written self-care plan  (04/18/2010)   Diabetes care plan printed    Hypertension self-management support: Written self-care plan  (04/18/2010)   Hypertension self-care plan printed.    Lipid self-management support: Written self-care plan  (04/18/2010)   Lipid self-care plan printed.

## 2010-10-17 NOTE — Progress Notes (Signed)
Summary: meds prob  Phone Note Call from Patient   Caller: Patient Summary of Call: states she was released from hosp on Sunday and they drugstore is trying to contact us about one of the meds - has a question about it.  [facility] [address]  Initial call taken by: De Nurse,  October 16, 2009 10:38 AM  Follow-up for Phone Call        lm Follow-up by: Golden Circle RN,  October 16, 2009 10:54 AM  Additional Follow-up for Phone Call Additional follow up Details #1::        states she did not get one of her rx because the mg were wrong.  Triamterene. to pcp to re-send in correct dose. Additional Follow-up by: Golden Circle RN,  October 17, 2009 3:21 PM    Additional Follow-up for Phone Call Additional follow up Details #2::    will d/w patient at visit today Follow-up by: Helane Rima DO,  October 21, 2009 9:46 AM

## 2010-10-17 NOTE — Assessment & Plan Note (Signed)
Summary: hospital follow-up   Vital Signs:  Patient profile:   55 year old female Height:      69.5 inches Weight:      178 pounds BMI:     26.00 Temp:     98.3 degrees F oral Pulse rate:   71 / minute BP sitting:   149 / 94  (right arm) Cuff size:   regular  Vitals Entered By: Tessie Fass CMA (October 17, 2009 4:30 PM) CC: Hospital Follow-Up Is Patient Diabetic? Yes Pain Assessment Patient in pain? no        Primary Care Provider:  Helane Rima DO  CC:  Hospital Follow-Up.  History of Present Illness: 55 year old AAF with recent NSTEMI.  Angioplasty was performed by cardiology. The patient's medications were reviewed and changed by cardiology. The patient will be followed by [facility].  1. HTN: Rx Lisinopril, Amlodipine.  2. HLD: Rx Crestor.  3. DM: New Dx. A1c = 7.4. Not on medication.  4. Tobacco Abuse: Still smoking, though down from 1 ppd to 3-4 cigs/day. Working on quitting.  Habits & Providers  Alcohol-Tobacco-Diet     Tobacco Status: current     Cigarette Packs/Day: <0.25  Current Medications (verified): 1)  Lisinopril 20 Mg Tabs (Lisinopril) .Marland Kitchen.. 1 Tablet By Mouth Daily 2)  Aspirin 325 Mg Tabs (Aspirin) .... Take 1 Tab By Mouth Every Day 3)  Amlodipine Besylate 5 Mg  Tabs (Amlodipine Besylate) .Marland Kitchen.. 1 By Mouth Every Day 4)  Plavix 75 Mg Tabs (Clopidogrel Bisulfate) .... Take 1 Tab By Mouth Daily 5)  Nitrostat 0.4 Mg Subl (Nitroglycerin) .... One Sl At First Sign of Chest Pain, May Repeat Every Five Minutes X 2 6)  Crestor 20 Mg Tabs (Rosuvastatin Calcium) .... One By Mouth Daily 7)  Dyrenium 50 Mg Caps (Triamterene) .... One By Mouth Daily - Prescribed By Cardiology  Allergies (verified): No Known Drug Allergies  Past History:  Past medical, surgical, family and social histories (including risk factors) reviewed for relevance to current acute and chronic problems.  Past Medical History:  1. CVA in 1997.   2. History of  brain aneurysm status post repair in 1990.   3. Hypertension.   4. Hyperlipidemia.   5. New diagnosis of diabetes mellitus type 2 in August of 2010 with hemoglobin A1c of 7.4.   6. Tobacco abuse.   Past Surgical History: Hysterectomy 2/2 Dysfunctional Uterine Bleeding 1993 Brain Aneurysm Repair 1990  Heart Catheterization 2010  Family History: Reviewed history from 10/10/2009 and no changes required. Mother - DM2, HTN Father - unknown  Sister - HTN 2 Brothers in good health No known h/o early MI in first degree relatives   Social History: Reviewed history from 10/10/2009 and no changes required. 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  Lives with 30yo daughter and granddaughter.  Divorced. Does not drink alcohol.  No h/o illicit drugs.  Does not exercise regularly - walks occasionally   Review of Systems General:  Denies chills, fever, and malaise. CV:  Denies chest pain or discomfort, palpitations, and swelling of feet. Resp:  Denies shortness of breath. GI:  Denies change in bowel habits. Neuro:  Denies headaches.  Physical Exam  General:  Well-developed,well-nourished,in no acute distress; alert,appropriate and cooperative throughout examination. Vitals reviewed. Lungs:  Normal respiratory effort, chest expands symmetrically. Lungs are clear to auscultation, no crackles or wheezes. Heart:  RRR 2/6 SM. Extremities:  No LE  edema or cyanosis.  Psych:  Oriented X3, memory intact for recent and remote, and normally interactive.     Impression & Recommendations:  Problem # 1:  HYPERTENSION, BENIGN ESSENTIAL (ICD-401.1) Assessment Unchanged  The following medications were removed from the medication list:    Maxzide 75-50 Mg Tabs (Triamterene-hctz) .Marland Kitchen... Take 1 tablet by mouth once a day- make appointment with your doctor before any more refills    Taztia Xt 240 Mg Cp24 (Diltiazem hcl er beads) .Marland Kitchen... 1 tablet by mouth daily Her updated  medication list for this problem includes:    Lisinopril 20 Mg Tabs (Lisinopril) .Marland Kitchen... 1 tablet by mouth daily    Amlodipine Besylate 5 Mg Tabs (Amlodipine besylate) .Marland Kitchen... 1 by mouth every day    Dyrenium 50 Mg Caps (Triamterene) ..... One by mouth daily - prescribed by cardiology  Orders: [facility]- Est  Level 4 ([address])  Problem # 2:  HYPERLIPIDEMIA (ICD-272.4) Assessment: Unchanged  The following medications were removed from the medication list:    Lovastatin 20 Mg Tabs (Lovastatin) .Marland Kitchen... Take 1 tab by mouth daily Her updated medication list for this problem includes:    Crestor 20 Mg Tabs (Rosuvastatin calcium) ..... One by mouth daily  Orders: [facility]- Est  Level 4 ([address])  Problem # 3:  DIABETES MELLITUS, TYPE II (ICD-250.00) Assessment: Unchanged  Her updated medication list for this problem includes:    Lisinopril 20 Mg Tabs (Lisinopril) .Marland Kitchen... 1 tablet by mouth daily    Aspirin 325 Mg Tabs (Aspirin) .Marland Kitchen... Take 1 tab by mouth every day    Glucophage Xr 500 Mg Xr24h-tab (Metformin hcl) ..... One by mouth daily x 2 weeks, then increase to 2 by mouth daily  Orders: [facility]- Est  Level 4 ([address])  Problem # 4:  TOBACCO DEPENDENCE (ICD-305.1) Assessment: Unchanged  Orders: [facility]- Est  Level 4 ([address]214)  Complete Medication List: 1)  Lisinopril 20 Mg Tabs (Lisinopril) .Marland Kitchen.. 1 tablet by mouth daily 2)  Aspirin 325 Mg Tabs (Aspirin) .... Take 1 tab by mouth every day 3)  Amlodipine Besylate 5 Mg Tabs (Amlodipine besylate) .Marland Kitchen.. 1 by mouth every day 4)  Plavix 75 Mg Tabs (Clopidogrel bisulfate) .... Take 1 tab by mouth daily 5)  Nitrostat 0.4 Mg Subl (Nitroglycerin) .... One sl at first sign of chest pain, may repeat every five minutes x 2 6)  Crestor 20 Mg Tabs (Rosuvastatin calcium) .... One by mouth daily 7)  Dyrenium 50 Mg Caps (Triamterene) .... One by mouth daily - prescribed by cardiology 8)  Glucophage Xr 500 Mg Xr24h-tab (Metformin hcl) .... One by mouth daily x 2 weeks, then increase  to 2 by mouth daily  Patient Instructions: 1)  It was nice to see today! Prescriptions: GLUCOPHAGE XR 500 MG XR24H-TAB (METFORMIN HCL) one by mouth daily x 2 weeks, then increase to 2 by mouth daily  #60 x 0   Entered and Authorized by:   Helane Rima DO   Signed by:   Helane Rima DO on 10/28/2009   Method used:   Electronically to        [facility]  [phone]* (retail)       [address]       [place], [place]  [address]       Ph: [phone] or [phone]       Fax: [phone]   RxID:   [id number] CRESTOR 20 MG TABS (ROSUVASTATIN CALCIUM) one by mouth daily  #  90 x 3   Entered and Authorized by:   Helane Rima DO   Signed by:   Helane Rima DO on 10/25/2009   Method used:   Electronically to        [facility]  [phone]* (retail)       [address]       [place], [place]  [address]       Ph: [phone] or [phone]       Fax: [phone]   RxID:   [id number] NITROSTAT 0.4 MG SUBL (NITROGLYCERIN) one SL at first sign of chest pain, may repeat every five minutes x 2  #20 x 0   Entered and Authorized by:   Helane Rima DO   Signed by:   Helane Rima DO on 10/25/2009   Method used:   Electronically to        [facility]  [phone]* (retail)       [address]       [place], [place]  [address]       Ph: [phone] or [phone]       Fax: [phone]   RxID:   [id number] LISINOPRIL 20 MG TABS (LISINOPRIL) 1 tablet by mouth daily  #90 x 3   Entered and Authorized by:   Helane Rima DO   Signed by:   Helane Rima DO on 10/25/2009   Method used:   Electronically to        [facility]  [phone]* (retail)       [address]       [place], [place]  [address]       Ph: [phone] or [phone]       Fax: [phone]   RxID:   [id number] AMLODIPINE BESYLATE 5 MG  TABS (AMLODIPINE BESYLATE) 1 by  mouth every day  #90 x 3   Entered and Authorized by:   Helane Rima DO   Signed by:   Helane Rima DO on 10/25/2009   Method used:   Electronically to        [facility]  [phone]* (retail)       [address]       [place], [place]  [address]       Ph: [phone] or [phone]       Fax: [phone]   RxID:   [id number]   Appended Document: hospital follow-up Please call this patient and let her know that I have her medication list now. I am adding a medication for her diabetes and have already sent it to her pharmacy.  Appended Document: hospital follow-up     Allergies: No Known Drug Allergies   Complete Medication List: 1)  Lisinopril 20 Mg Tabs (Lisinopril) .Marland Kitchen.. 1 tablet by mouth daily 2)  Aspirin 325 Mg Tabs (Aspirin) .... Take 1 tab by mouth every day 3)  Amlodipine Besylate 5 Mg Tabs (Amlodipine besylate) .Marland Kitchen.. 1 by mouth every day 4)  Plavix 75 Mg Tabs (Clopidogrel bisulfate) .... Take 1 tab by mouth daily 5)  Nitrostat 0.4 Mg Subl (Nitroglycerin) .... One sl at first sign of chest pain, may repeat every five minutes x 2 6)  Crestor 20 Mg Tabs (Rosuvastatin calcium) .... One by mouth daily 7)  Dyrenium 50 Mg Caps (Triamterene) .... One by mouth  daily - prescribed by cardiology 8)  Glucophage Xr 500 Mg Xr24h-tab (Metformin hcl) .... One by mouth daily x 2 weeks, then increase to 2 by mouth daily   Prevention & Chronic Care Immunizations   Influenza vaccine: Fluvax Non-MCR  (06/15/2008)   Influenza vaccine deferral: Not indicated  (10/28/2009)   Influenza vaccine due: 05/17/2011    Tetanus booster: 01/18/2004: Done.   Tetanus booster due: 01/17/2014    Pneumococcal vaccine: Not documented  Colorectal Screening   Hemoccult: Not documented    Colonoscopy: Not documented   Colonoscopy due: Refused  (06/15/2008)  Other Screening   Pap smear: hysterectomy for fibroids  (06/15/2008)   Pap smear due: Not Indicated     Mammogram: Done.  (01/18/2004)   Mammogram due: 01/17/2005   Smoking status: current  (10/17/2009)   Smoking cessation counseling: yes  (11/20/2006)  Diabetes Mellitus   HgbA1C: 7.4  (05/11/2009)    Eye exam: Not documented    Foot exam: Not documented   High risk foot: Not documented   Foot care education: Not documented    Urine microalbumin/creatinine ratio: Not documented    Diabetes flowsheet reviewed?: Yes   Progress toward A1C goal: Unchanged  Lipids   Total Cholesterol: 191  (12/21/2008)   LDL: 127  (12/21/2008)   LDL Direct: 170  (01/14/2007)   HDL: 29  (12/21/2008)   Triglycerides: 174  (12/21/2008)    SGOT (AST): 18  (12/21/2008)   SGPT (ALT): 20  (12/21/2008)   Alkaline phosphatase: 73  (12/21/2008)   Total bilirubin: 0.5  (12/21/2008)    Lipid flowsheet reviewed?: Yes   Progress toward LDL goal: Unchanged  Hypertension   Last Blood Pressure: 149 / 94  (10/17/2009)   Serum creatinine: 1.02  (12/21/2008)   Serum potassium 3.3  (12/21/2008)    Hypertension flowsheet reviewed?: Yes   Progress toward BP goal: Deteriorated  Self-Management Support :   Personal Goals (by the next clinic visit) :     Personal A1C goal: 6  (10/28/2009)     Personal blood pressure goal: 130/80  (10/28/2009)     Personal LDL goal: 70  (10/28/2009)    Patient will work on the following items until the next clinic visit to reach self-care goals:     Medications and monitoring: take my medicines every day  (10/28/2009)     Eating: drink diet soda or water instead of juice or soda, eat more vegetables, use fresh or frozen vegetables, eat foods that are low in salt, eat baked foods instead of fried foods, eat fruit for snacks and desserts, limit or avoid alcohol  (10/28/2009)     Activity: take a 30 minute walk every day, take the stairs instead of the elevator, park at the far end of the parking lot  (10/28/2009)    Diabetes self-management support: Written self-care plan   (10/28/2009)   Diabetes care plan printed    Hypertension self-management support: Written self-care plan  (10/28/2009)   Hypertension self-care plan printed.    Lipid self-management support: Written self-care plan  (10/28/2009)   Lipid self-care plan printed.  Appended Document: hospital follow-up called pt and lmvm to check with her pharmacy.

## 2010-10-17 NOTE — Assessment & Plan Note (Signed)
[facility]   Visit Type:  Hospital H+P Primary Care Provider:  Helane Rima DO  CC:  Chest Pain .  History of Present Illness: 65 F PMH sig. for HTN, HLD, p/w substernal heavy chest pain waking started this AM at 4:00, waking her from sleep. Pain resolved at around 5:30 AM, after SL NTG and NTG paste at [facility]. Pain was worse with lying down, better with sitting up and walking around; not affected by meals or by palpation. Has never had pain like this before. Initially felt weak in bilateral arms, now resolved. Reports mild nausea on presentation to [facility], now resolved. Denies numbness, tingling, radiation of pain, diaphoresis, dyspnea, LE edema, fever/chills, cough, emesis, presyncope, palpitations, dyspepsia, dysuria, abdominal pain. Smokes one pack per day since high school, does not exercise. No family history of early MI.   AT [facility] rec'd - NTG 0.4 mg SL x 1, NTG paste, KCl 40 meq x 1, ASA 324 mg x 1   Current Medications (verified): 1)  Maxzide 75-50 Mg Tabs (Triamterene-Hctz) .... Take 1 Tablet By Mouth Once A Day- Make Appointment With Your Doctor Before Any More Refills 2)  Taztia Xt 240 Mg  Cp24 (Diltiazem Hcl Er Beads) .Marland Kitchen.. 1 Tablet By Mouth Daily 3)  Lisinopril 10 Mg  Tabs (Lisinopril) .Marland Kitchen.. 1 Tab By Mouth Daily.  Please Schedule Appointment With Doctor For Blood Pressure Check. 4)  Anacin 81 Mg Tbec (Aspirin) .Marland Kitchen.. 1 Tablet By Mouth Daily 5)  Lovastatin 20 Mg Tabs (Lovastatin) .... 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  Lives with  30yo daughter and granddaughter.  Divorced.  Does not drink alcohol.  No h/o illicit drugs.  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  Lives with 30yo daughter and granddaughter.  Divorced.  Does not drink alcohol.  No h/o illicit drugs.  Does not exercise regularly - walks occasionally   Review of Systems       as per HPI Also negative for weight loss, LE edema, headache, syncope, melena, hematochezia, constipation, diarrhea, incontinence     Physical Exam  General:  overweight, appears older than stated age, NAD, sitting comfortably w/o pain in bed     VITALS:  BP 162/97, HR 54, RR 20, T 98.0, O2 96% on RA    Head:  NCAT  Eyes:  PERRL, EOM Nose:  no external deformity, no rhinorrhea  Mouth:  slightly dry mucus membranes, no posterior pharyngeal erythema or exudate  Neck:  no JVD, no lymphadenopathy , no carotid bruits  Lungs:  Normal respiratory effort, chest expands symmetrically. Lungs are clear  to auscultation, no crackles or wheezes. Heart:  Bradycardia to low 50's but regular rhythm. Grade  2/6 systolic ejection murmur(previously documented outpatient)  best at RUSB , does not radiate to carotids. NO JVD or carotid bruits. Abdomen:  soft, non tender, non distended, +BS, no organomegaly  Msk:  5/5 strength bilateral upper and lower extremities  Pulses:  2+ radials, regular but slow  Extremities:  no LE edema or cyanosis  Neurologic:  alert & oriented X3, cranial nerves II-XII intact, strength normal in all extremities, and  sensation intact to light touch.   Skin:  no rash, good turgor  Psych:  Oriented X3, memory intact for recent and remote, and normally interactive.   Additional Exam:  BMET: 140 / 2.7 / 101 / 31 / 11 / 0.91 < 196  CBC: 4.2 > 13.9 / 41.0 < 118  CXR: Cardiomegaly and mild pulmonary vascular congestion. Also w/ hyperaeration.  EKG: Sinus brady (rate 53), normal PR interval, normal axes, prolonged QT but w/ normal QTc, flipped T waves in diffuse leads, no ST elevation or depression   Impression & Recommendations:  Problem # 1:  Chest pain Substernal, relieved by NTG (but not by rest), non-exertional. Patient with multiple risk factors for CAD - DM2, HTN, HLD, tobacco. TIMI score = 2 (8% risk all-cause mortality, new or recurrent MI, or severe recurrent ischemia requiring urgent revascularization). EKG w/ sinus brady, diffuse T wave inversion - diffuse ischemia. Will repeat EKG now, if pain recurs. Initial CE negative approximately 1 hour post event. Will cycle CE x 2 sets 8 hrs apart. Will hold off on B-Blocker with bradycardia.  Will give morphine, NTG SL for chest pain. Will call cardiology at this time regarding risk stratification inpatient vs outpatient. Would consider Myoview.   Problem # 2:  DIABETES MELLITUS, TYPE II (ICD-250.00) A1C = 7.4 in 05/11/09. Meets ADA criteria for DM diagnosis. Will start SSI at thits time. Patient does not seem to be aware of this diagnosis. Will discuss with patient. Carb modified diet.   Her updated medication list for this problem includes:    Lisinopril 10 Mg Tabs (Lisinopril) .Marland Kitchen... 1 tab by mouth daily.      Anacin 81 Mg Tbec (Aspirin) .Marland Kitchen... 1 tablet by mouth daily  Labs Reviewed: Creat: 1.02 (12/21/2008)    Reviewed HgBA1c results: 7.4 (05/11/2009)  Problem # 3:  TOBACCO DEPENDENCE (ICD-305.1) Smoking cessation consult, although patient precontemplative at this time.   Problem # 4:  HYPERLIPIDEMIA (ICD-272.4) Will increase statin dose to 40 mg by  mouth qday. Check CMET, lipid panel. LFTs, lipid panel reviewed below.  Her updated medication list for this problem includes:    Lovastatin 20 Mg Tabs (Lovastatin) .Marland Kitchen... Take 1 tab by mouth daily  Labs Reviewed: SGOT: 18 (12/21/2008)   SGPT: 20 (12/21/2008)   HDL:29 (12/21/2008)  LDL:127 (12/21/2008)  Chol:191 (12/21/2008)  Trig:174 (12/21/2008)  Problem # 5:  CVA (ICD-436) Remote history of CVA. Will continue ASA, BP control at this time. Stable   Problem # 6:  HYPERTENSION, BENIGN ESSENTIAL (ICD-401.1)  Continue Maxzide. Hold off on beta-blocker for now given bradycardia. Hold lisinopril w/ potential for cath. Hold diltiazem with bradycardia. Hydralazine IV as needed.    Prior BP: 121/84 (05/11/2009)  Labs Reviewed: K+: 3.3 (12/21/2008) Creat: : 1.02 (12/21/2008)   Chol: 191 (12/21/2008)   HDL: 29 (12/21/2008)   LDL: 127 (12/21/2008)   TG: 174 (12/21/2008)  Problem # 7:  FEN/GI  Assessment: New KVO IV w/  NS. Replete K. BMET for AM. Heart healthy carb modified diet.   Problem # 8:  Prophylaxis Heparin 5000 units SQ three times a day for DVT PPX.   Problem # 9:  Disposition Pending workup and improvement of problem #1. Outpatient vs inpatient w/u. Appreciate cards consult.   Complete Medication List: 1)  Maxzide 75-50 Mg Tabs (Triamterene-hctz) .... Take 1 tablet by mouth once a day- make appointment with your doctor before any more refills 2)  Taztia Xt 240 Mg Cp24 (Diltiazem hcl er beads) .Marland Kitchen.. 1 tablet by mouth daily 3)  Lisinopril 10 Mg Tabs (Lisinopril) .Marland Kitchen.. 1 tab by mouth daily.  please schedule appointment with doctor for blood pressure check. 4)  Anacin 81 Mg Tbec (Aspirin) .Marland Kitchen.. 1 tablet by mouth daily 5)  Lovastatin 20 Mg Tabs (Lovastatin) .... Take 1 tab by mouth daily  Appended Document: [facility] Dictation Report [phone]

## 2010-10-17 NOTE — Progress Notes (Signed)
Summary: refill  Phone Note Refill Request Call back at [phone] Message from:  Patient  Refills Requested: Medication #1:  LISINOPRIL 20 MG TABS 1 tablet by mouth daily  Medication #2:  PLAVIX 75 MG TABS Take 1 tab by mouth daily [facility] [address]  Initial call taken by: De Nurse,  Jan 29, 2010 12:32 PM    Prescriptions: PLAVIX 75 MG TABS (CLOPIDOGREL BISULFATE) Take 1 tab by mouth daily  #30 x 1   Entered and Authorized by:   Helane Rima DO   Signed by:   Helane Rima DO on 01/30/2010   Method used:   Electronically to        [facility] [address] (retail)       [phone]       [place], [place]  [address]       Ph: [phone]       Fax: [phone]   RxID:   [id number] LISINOPRIL 20 MG TABS (LISINOPRIL) 1 tablet by mouth daily  #30 x 1   Entered and Authorized by:   Helane Rima DO   Signed by:   Helane Rima DO on 01/30/2010   Method used:   Electronically to        [facility] [address] (retail)       [phone]       [place], [place]  [address]       Ph: [phone]       Fax: [phone]   RxID:   [id number]

## 2010-10-17 NOTE — Letter (Signed)
Summary: Mammogram Reminder  [facility]  [address]   [place], [place] [address]   Phone: [phone]  Fax: [phone]    03/26/2009  Amber Suarez [address] [place], [place]  [address]  Dear [name],  Our records show that you are due for your yearly mammogram. Don't wait! Mammography is an important tool in the early detection of breast cancer. Please call to schedule this important screening exam today!  I am here to help you stay healthy! Let me know if you have any questions.  Sincerely,   Helane Rima DO

## 2010-10-17 NOTE — Assessment & Plan Note (Signed)
Summary: Smoking Cessation Group Class - Rx   Primary Care Provider:  Helane Rima DO   History of Present Illness: Amber Suarez presents today for smoking cessation support group for assistance in quit process.   Patient arrives for group tobacco cessation class.  Patient reports readines to quit of: 10 out of 10 (Scale = zero not ready  to 10 completely ready).   Patient reports quitting: 1time previously.  Longest reported duration of absence from tobacco was: 1 week  Patient started smoking at age of: 74  . Currently smoking:  20 cigs/ packs per day of: Doral Ultra Light (brand).  Patient reports smoking: 5-30  minutes after awakening.  Denies other smoking friends and family.   Habits & Providers  Alcohol-Tobacco-Diet     Tobacco Status: current     Tobacco Counseling: to quit use of tobacco products     Cigarette Packs/Day: 1     Year Started: 1971     Pack years: 40  Allergies: No Known Drug Allergies  Social History: Packs/Day:  1   Impression & Recommendations:  Problem # 1:  TOBACCO DEPENDENCE (ICD-305.1) Assessment Unchanged  Moderate Nicotine Abuse of:40 years duration in a patient who is: excellent candidate for success b/c of: health motivators and encouragement from family/friends.   Initiated bupropion. Patient denies seizure history.  Counseled on purpose, proper use, and potential adverse effects, including insomnia and dry mouth.  Pt will also begin using nicotine lozenges for breakthrough cravings.  She has set her quit date for 11/14/09, one week after starting buproprion.  F/U Rx Clinic Visit: 1 month for March Group class   Total time with patient in face-to-face counseling: 60  minutes.  Patient seen with: Elaina Pattee, PharmD Resident, Eda Keys, PharmD Resident, Geoffry Paradise, PharmD Candidate  Orders: Smoking Cessation Class [phone])  Complete Medication List: 1)  Lisinopril 20 Mg Tabs (Lisinopril) .Marland Kitchen.. 1 tablet by mouth daily 2)  Aspirin  325 Mg Tabs (Aspirin) .... Take 1 tab by mouth every day 3)  Amlodipine Besylate 5 Mg Tabs (Amlodipine besylate) .Marland Kitchen.. 1 by mouth every day 4)  Plavix 75 Mg Tabs (Clopidogrel bisulfate) .... Take 1 tab by mouth daily 5)  Nitrostat 0.4 Mg Subl (Nitroglycerin) .... One sl at first sign of chest pain, may repeat every five minutes x 2 6)  Crestor 20 Mg Tabs (Rosuvastatin calcium) .... One by mouth daily 7)  Dyrenium 50 Mg Caps (Triamterene) .... One by mouth daily - prescribed by cardiology 8)  Glucophage Xr 500 Mg Xr24h-tab (Metformin hcl) .... One by mouth daily x 2 weeks, then increase to 2 by mouth daily 9)  Bupropion Hcl 150 Mg Xr12h-tab (Bupropion hcl) .... Take 1 tablet daily for 3 days, then start taking 1 tablet twice daily. Prescriptions: BUPROPION HCL 150 MG XR12H-TAB (BUPROPION HCL) Take 1 tablet daily for 3 days, then start taking 1 tablet twice daily.  #60 x 3   Entered by:   Christian Mate D   Authorized by:   Helane Rima DO   Signed by:   Madelon Lips Pharm D on 11/06/2009   Method used:   Electronically to        [facility] [address] (retail)       [phone]       [place], [place]  [address]       Ph: [phone]       Fax: [phone]  RxID:   [id number]   Tobacco Counseling   Currently uses tobacco.    Cigarettes      Year started smoking cigarettes:      1973     Number of packs per day smoked:      1     Years smoked:            40     Packs per year:          365     Pack Years:            40   Readiness to Quit:     Very Ready Cessation Stage:     Action Target Quit Date:     11/14/2009  Quitting Barriers:      -  stress     -  fear of weight gain     -  can smoke at work     -  feels addicted  Quitting Motivators:      -  poor health     -  HTN     -  cerebrovascular disease     -  coronary disease     -  children     -  cost     -  family pressure     -  healthier lifestyle  Previous Quit  Attempts   Previously Tried to quit:     Yes # of Previous quit attempts:     1 Longest Successful Quit Period:   1 week  Quit Methods Tried:      -  cold [place]     -  nicotine patch  Reason for restarting:      -  feels addicted  Comments: to quit use of tobacco products  Smoking Cessation Plan   Readiness:     Very Ready Cessation Stage:   Action Target Quit Date:   11/14/2009  Counseled on:      -  environment change     -  support network     -  routine/lifestyle  change     -  nicotine withdrawal symptoms     -  medication use/compliance  New Medications: BUPROPION HCL 150 MG XR12H-TAB (BUPROPION HCL) Take 1 tablet daily for 3 days, then start taking 1 tablet twice daily.  Comments: Amber Suarez is a very good candidate for successful quitting and has many motivators.  She has set a quit date of 11/14/09, one week after starting buproprion.  Pt has selected bupropion as her primary quit agent.  Have provided instructions for use of this medication.  Pt will also purchase nicotine lozenges for breakthrough cravings.    Medications Added this Update:  BUPROPION HCL 150 MG XR12H-TAB (BUPROPION HCL) Take 1 tablet daily for 3 days, then start taking 1 tablet twice daily.  Orders Added this Update:  Smoking Cessation Class [J4782]

## 2010-10-17 NOTE — Progress Notes (Signed)
Summary: refill  Phone Note Refill Request Call back at Home Phone [phone] Message from:  Patient  Refills Requested: Medication #1:  TAZTIA XT 240 MG  CP24 1 tablet by mouth daily Initial call taken by: De Nurse,  April 30, 2009 2:52 PM    Prescriptions: TAZTIA XT 240 MG  CP24 (DILTIAZEM HCL ER BEADS) 1 tablet by mouth daily  #31 Capsule x 6   Entered and Authorized by:   Helane Rima MD   Signed by:   Helane Rima MD on 04/30/2009   Method used:   Electronically to        [facility]  [phone]* (retail)       [address]       [place], [place]  [address]       Ph: [phone] or [phone]       Fax: [phone]   RxID:   [id number]

## 2010-10-17 NOTE — Progress Notes (Signed)
Summary: called pt/re: meds/TS  Phone Note Call from Patient Call back at Home Phone [phone]   Caller: Patient Summary of Call: Pt went to pharmacy and there was no meds there for her.  [facility] [address]. Initial call taken by: Clydell Hakim,  November 06, 2009 9:43 AM  Follow-up for Phone Call        [facility] [address]. Follow-up by: Arlyss Repress CMA,,  November 06, 2009 9:44 AM  Additional Follow-up for Phone Call Additional follow up Details #1::        [facility] and [facility] [address]  Additional Follow-up by: Arlyss Repress CMA,,  November 06, 2009 2:41 PM

## 2010-10-17 NOTE — Assessment & Plan Note (Signed)
Summary: CKD Update - Removed ARF Dx   Allergies: No Known Drug Allergies   Complete Medication List: 1)  Lisinopril 20 Mg Tabs (Lisinopril) .Marland Kitchen.. 1 tablet by mouth daily 2)  Aspirin 325 Mg Tabs (Aspirin) .... Take 1 tab by mouth every day 3)  Plavix 75 Mg Tabs (Clopidogrel bisulfate) .... Take 1 tab by mouth daily 4)  Nitrostat 0.4 Mg Subl (Nitroglycerin) .... One sl at first sign of chest pain, may repeat every five minutes x 2 5)  Crestor 20 Mg Tabs (Rosuvastatin calcium) .Marland Kitchen.. 1 tablet by mouth daily 6)  Dyrenium 50 Mg Caps (Triamterene) .... One by mouth daily 7)  Glucophage Xr 500 Mg Xr24h-tab (Metformin hcl) .... 2 by mouth daily 8)  Budeprion Xl 300 Mg Xr24h-tab (Bupropion hcl) .... One by mouth each am

## 2010-10-17 NOTE — Letter (Signed)
Summary: A1c Letter  [facility]  [address]   [place], [place] [address]   Phone: [phone]  Fax: [phone]    09/24/2009  Amber Suarez [address] [place], [place]  [address]  Dear [name],  It is important that you make an appointment in the next few weeks so that we can discuss your recent lab results. Please let me know if you have any questions.   Sincerely,   Helane Rima DO

## 2010-10-17 NOTE — Progress Notes (Signed)
Summary: refill  Phone Note Refill Request Call back at Home Phone [phone] Message from:  Patient  Refills Requested: Medication #1:  DYRENIUM 50 MG CAPS one by mouth daily Initial call taken by: De Nurse,  October 09, 2010 9:16 AM  Follow-up for Phone Call       Follow-up by: Helane Rima DO,  October 09, 2010 9:18 AM    Prescriptions: DYRENIUM 50 MG CAPS (TRIAMTERENE) one by mouth daily  #30 x 6   Entered and Authorized by:   Helane Rima DO   Signed by:   Helane Rima DO on 10/09/2010   Method used:   Electronically to        [facility] [address] (retail)       [phone]       [place], [place]  [address]       Ph: [phone]       Fax: [phone]   RxID:   [id number]

## 2010-10-17 NOTE — Consult Note (Signed)
Summary: [facility]  [facility]   Imported By: Clydell Hakim 11/07/2009 16:08:34  _____________________________________________________________________  External Attachment:    Type:   Image     Comment:   External Document  Appended Document: Medication Update    Clinical Lists Changes  Medications: Removed medication of AMLODIPINE BESYLATE 5 MG  TABS (AMLODIPINE BESYLATE) 1 by mouth every day Changed medication from DYRENIUM 50 MG CAPS (TRIAMTERENE) one by mouth daily - prescribed by cardiology to MAXZIDE 75-50 MG TABS (TRIAMTERENE-HCTZ) one by mouth daily Changed medication from LISINOPRIL 20 MG TABS (LISINOPRIL) 1 tablet by mouth daily to LISINOPRIL 20 MG TABS (LISINOPRIL) 1 tablet by mouth daily Changed medication from CRESTOR 20 MG TABS (ROSUVASTATIN CALCIUM) one by mouth daily to LOVASTATIN 20 MG TABS (LOVASTATIN) Take 1 tab by mouth daily - Signed Rx of LOVASTATIN 20 MG TABS (LOVASTATIN) Take 1 tab by mouth daily;  #90 x 3;  Signed;  Entered by: Helane Rima DO;  Authorized by: Helane Rima DO;  Method used: Historical    Prescriptions: LOVASTATIN 20 MG TABS (LOVASTATIN) Take 1 tab by mouth daily  #90 x 3   Entered and Authorized by:   Helane Rima DO   Signed by:   Helane Rima DO on 11/07/2009   Method used:   Historical   RxID:   [id number]

## 2010-10-17 NOTE — Assessment & Plan Note (Signed)
Summary: Smoking Cessation Class   Primary Care Provider:  Helane Rima DO   History of Present Illness: Amber Suarez reports she smokes about 1 pack every 1.5 days.  She has been doing this about 30 years.  She had a stroke about 15 years ago and a heart attack a month ago.  She is motivated to quit largely because of these health issues.  She reports one quit attempt after her stroke.  She used the patch and ripped it off after a very short time (perhaps less than a day).  She reports her major reasons for smoking include habit and boredom.  She eventually stumbles on stress relief.  She is most concerned about irritability and carvings for junk food when she stops smoking.  She believes she needs a pharmacologic aid to help her be successful.  She heard from two people to steer clear of Chantix so this might be a tough sell if it were thought to be a good option.  No smokers in the home.  Family members are supportive.  Many smokers at work.  She has a plan to walk instead of go on a smoke break while at work.  She has a support person there as well.  She has a jar to put her smoke money in and plans to shop with it when she is successful.    Allergies: No Known Drug Allergies   Impression & Recommendations:  Problem # 1:  TOBACCO DEPENDENCE (ICD-305.1)  Discussed cognitive techniques such as changing her thought, "I have to have a cigarette" to "I want a cigarette."  Gave her the smoking diary as a way to combat the habit of smoking a cigarette without thinking about it.  Discussed change in self-identity as well and how long it will be "hard."    On a scale of one to 10 she is at an 8 in terms of confidence that she can quit.  She was a one six months ago.  She is very interested in pharmacologic options.  Close follow-up will be helpful too.  I gave her the 1-800-quitnow resource.  She plans to follow up with the smoke cessation class next week.  Orders: Smoking Cessation Class  (E4540)  Complete Medication List: 1)  Lisinopril 20 Mg Tabs (Lisinopril) .Marland Kitchen.. 1 tablet by mouth daily 2)  Aspirin 325 Mg Tabs (Aspirin) .... Take 1 tab by mouth every day 3)  Amlodipine Besylate 5 Mg Tabs (Amlodipine besylate) .Marland Kitchen.. 1 by mouth every day 4)  Plavix 75 Mg Tabs (Clopidogrel bisulfate) .... Take 1 tab by mouth daily 5)  Nitrostat 0.4 Mg Subl (Nitroglycerin) .... One sl at first sign of chest pain, may repeat every five minutes x 2 6)  Crestor 20 Mg Tabs (Rosuvastatin calcium) .... One by mouth daily 7)  Dyrenium 50 Mg Caps (Triamterene) .... One by mouth daily - prescribed by cardiology 8)  Glucophage Xr 500 Mg Xr24h-tab (Metformin hcl) .... One by mouth daily x 2 weeks, then increase to 2 by mouth daily

## 2010-11-26 ENCOUNTER — Ambulatory Visit (INDEPENDENT_AMBULATORY_CARE_PROVIDER_SITE_OTHER): Payer: Private Health Insurance - Indemnity | Admitting: Family Medicine

## 2010-11-26 ENCOUNTER — Encounter: Payer: Self-pay | Admitting: Family Medicine

## 2010-11-26 DIAGNOSIS — F17200 Nicotine dependence, unspecified, uncomplicated: Secondary | ICD-10-CM

## 2010-11-26 DIAGNOSIS — G4700 Insomnia, unspecified: Secondary | ICD-10-CM

## 2010-11-26 DIAGNOSIS — E119 Type 2 diabetes mellitus without complications: Secondary | ICD-10-CM

## 2010-11-26 DIAGNOSIS — E785 Hyperlipidemia, unspecified: Secondary | ICD-10-CM

## 2010-11-26 DIAGNOSIS — I10 Essential (primary) hypertension: Secondary | ICD-10-CM

## 2010-11-26 LAB — CONVERTED CEMR LAB
ALT: 13 units/L (ref 0–35)
Albumin: 4.5 g/dL (ref 3.5–5.2)
CO2: 29 meq/L (ref 19–32)
Calcium: 9.3 mg/dL (ref 8.4–10.5)
Chloride: 101 meq/L (ref 96–112)
Cholesterol: 111 mg/dL (ref 0–200)
Glucose, Bld: 126 mg/dL — ABNORMAL HIGH (ref 70–99)
Potassium: 3.2 meq/L — ABNORMAL LOW (ref 3.5–5.3)
Sodium: 143 meq/L (ref 135–145)
Total Bilirubin: 0.4 mg/dL (ref 0.3–1.2)
Total Protein: 8 g/dL (ref 6.0–8.3)
Triglycerides: 95 mg/dL (ref <150)
VLDL: 19 mg/dL (ref 0–40)

## 2010-11-26 LAB — LIPID PANEL
HDL: 26 mg/dL — ABNORMAL LOW (ref >39)
LDL Cholesterol: 66 mg/dL (ref 0–99)
Total CHOL/HDL Ratio: 4.3 Ratio
Triglycerides: 95 mg/dL (ref <150)

## 2010-11-26 LAB — COMPREHENSIVE METABOLIC PANEL WITH GFR
ALT: 13 U/L (ref 0–35)
AST: 12 U/L (ref 0–37)
Albumin: 4.5 g/dL (ref 3.5–5.2)
Alkaline Phosphatase: 71 U/L (ref 39–117)
BUN: 14 mg/dL (ref 6–23)
CO2: 29 meq/L (ref 19–32)
Calcium: 9.3 mg/dL (ref 8.4–10.5)
Chloride: 101 meq/L (ref 96–112)
Creat: 1.35 mg/dL — ABNORMAL HIGH (ref 0.40–1.20)
Glucose, Bld: 126 mg/dL — ABNORMAL HIGH (ref 70–99)
Potassium: 3.2 meq/L — ABNORMAL LOW (ref 3.5–5.3)
Sodium: 143 meq/L (ref 135–145)
Total Bilirubin: 0.4 mg/dL (ref 0.3–1.2)
Total Protein: 8 g/dL (ref 6.0–8.3)

## 2010-11-26 MED ORDER — TRAZODONE HCL 50 MG PO TABS: 50.0000 mg | ORAL_TABLET | Freq: Every day | ORAL | Status: AC

## 2010-11-26 MED ORDER — VARENICLINE TARTRATE 0.5 MG PO TABS: 0.5000 mg | ORAL_TABLET | Freq: Two times a day (BID) | ORAL | Status: AC

## 2010-11-26 NOTE — Assessment & Plan Note (Signed)
Start Chantix

## 2010-11-26 NOTE — Patient Instructions (Signed)
It was nice to see you today!  I am checking labs.  I am prescribing CHANTIX to help you to STOP SMOKING.  I am prescribing Trazodone for your Insomnia.

## 2010-11-26 NOTE — Assessment & Plan Note (Signed)
Discussed proper sleep hygiene. Rx Trazodone.

## 2010-11-26 NOTE — Assessment & Plan Note (Signed)
Recheck CMP, FLP.

## 2010-11-26 NOTE — Assessment & Plan Note (Signed)
A1c today.  

## 2010-11-26 NOTE — Progress Notes (Signed)
  Subjective:    Patient ID: Amber Suarez, female    DOB: 10-14-1955, 55 y.o.   MRN: [id number]  HPI  1. HTN: Rx Lisinopril, Triamterene. Denies CP, SOB, N/V/D, LE edema, HA, dizziness.  2. HLD: Rx Crestor. Denies myalgias. Due for CMP, FLP. Fasting today.  3. DM: Due for A1c. Previously controlled. Rx Metformin. Denies GI c/o.  4. Tobacco Abuse: Still smoking 1/2 ppd. Ready to quit - requests Chantix.  5. CAD: s/p NSTEMI.  Angioplasty was performed by cardiology. The patient is followed by [facility] - Dr. Clarene Duke, saw him last month.  6. Insomnia: x 1 year. Easy to go to sleep, but waked at 3:00 or so every night. Not waking due to pain or racing thoughts. Urinating x 1. TV stays on all night. No daytime naps. No snoring or waking suddenly. No night sweats. This is starting to affect her ability to work.  PMHX  1. CVA in 1997.   2. History of brain aneurysm status post repair in 1990.   3. Hypertension.   4. Hyperlipidemia.   5. New diagnosis of diabetes mellitus type 2 in August of 2010 with hemoglobin A1c of 7.4.   6. Tobacco abuse.   Review of Systems See HPI.    Objective:   Physical Exam General:  Well-developed,well-nourished,in no acute distress; alert,appropriate and cooperative throughout examination. Vitals reviewed. Lungs:  Normal respiratory effort, chest expands symmetrically. Lungs are clear to auscultation, no crackles or wheezes. Heart:  RRR 2/6 SM. Pulses:  2+ DP. Extremities:  No LE edema or cyanosis.  Psych:  Oriented X3, memory intact for recent and remote, and normally interactive.      Assessment & Plan:

## 2010-11-26 NOTE — Assessment & Plan Note (Signed)
Stable.  Continue current treatment

## 2010-12-01 LAB — CBC
HCT: 35.3 % — ABNORMAL LOW (ref 36.0–46.0)
HCT: 39.1 % (ref 36.0–46.0)
HCT: 39.4 % (ref 36.0–46.0)
HCT: 40.6 % (ref 36.0–46.0)
Hemoglobin: 11.8 g/dL — ABNORMAL LOW (ref 12.0–15.0)
Hemoglobin: 13.3 g/dL (ref 12.0–15.0)
MCHC: 33.6 g/dL (ref 30.0–36.0)
MCHC: 34 g/dL (ref 30.0–36.0)
MCV: 88.3 fL (ref 78.0–100.0)
MCV: 88.9 fL (ref 78.0–100.0)
Platelets: 113 10*3/uL — ABNORMAL LOW (ref 150–400)
Platelets: 91 10*3/uL — ABNORMAL LOW (ref 150–400)
RDW: 13.6 % (ref 11.5–15.5)
RDW: 14.1 % (ref 11.5–15.5)
WBC: 6.5 10*3/uL (ref 4.0–10.5)

## 2010-12-01 LAB — POTASSIUM
Potassium: 3.3 mEq/L — ABNORMAL LOW (ref 3.5–5.1)
Potassium: 3.6 mEq/L (ref 3.5–5.1)

## 2010-12-01 LAB — COMPREHENSIVE METABOLIC PANEL
Alkaline Phosphatase: 62 U/L (ref 39–117)
BUN: 12 mg/dL (ref 6–23)
Chloride: 104 mEq/L (ref 96–112)
GFR calc non Af Amer: >60 mL/min (ref >60)
Glucose, Bld: 135 mg/dL — ABNORMAL HIGH (ref 70–99)
Potassium: 2.9 mEq/L — ABNORMAL LOW (ref 3.5–5.1)
Total Bilirubin: 0.8 mg/dL (ref 0.3–1.2)

## 2010-12-01 LAB — BASIC METABOLIC PANEL
BUN: 10 mg/dL (ref 6–23)
BUN: 10 mg/dL (ref 6–23)
BUN: 11 mg/dL (ref 6–23)
CO2: 23 mEq/L (ref 19–32)
CO2: 24 mEq/L (ref 19–32)
Chloride: 105 mEq/L (ref 96–112)
Chloride: 107 mEq/L (ref 96–112)
Chloride: 108 mEq/L (ref 96–112)
Creatinine, Ser: 0.97 mg/dL (ref 0.4–1.2)
Glucose, Bld: 119 mg/dL — ABNORMAL HIGH (ref 70–99)
Potassium: 3.2 mEq/L — ABNORMAL LOW (ref 3.5–5.1)
Potassium: 3.6 mEq/L (ref 3.5–5.1)
Potassium: 3.7 mEq/L (ref 3.5–5.1)

## 2010-12-01 LAB — HEPARIN LEVEL (UNFRACTIONATED)
Heparin Unfractionated: 0.53 IU/mL (ref 0.30–0.70)
Heparin Unfractionated: <0.1 IU/mL — ABNORMAL LOW (ref 0.30–0.70)

## 2010-12-01 LAB — CARDIAC PANEL(CRET KIN+CKTOT+MB+TROPI)
CK, MB: 38.4 ng/mL (ref 0.3–4.0)
Relative Index: 6.9 — ABNORMAL HIGH (ref 0.0–2.5)

## 2010-12-01 LAB — GLUCOSE, CAPILLARY
Glucose-Capillary: 116 mg/dL — ABNORMAL HIGH (ref 70–99)
Glucose-Capillary: 124 mg/dL — ABNORMAL HIGH (ref 70–99)
Glucose-Capillary: 133 mg/dL — ABNORMAL HIGH (ref 70–99)
Glucose-Capillary: 152 mg/dL — ABNORMAL HIGH (ref 70–99)
Glucose-Capillary: 89 mg/dL (ref 70–99)

## 2010-12-01 LAB — MAGNESIUM: Magnesium: 2 mg/dL (ref 1.5–2.5)

## 2010-12-01 LAB — LIPID PANEL
Cholesterol: 149 mg/dL (ref 0–200)
Total CHOL/HDL Ratio: 4.8 RATIO

## 2010-12-01 LAB — HEMOGLOBIN A1C: Mean Plasma Glucose: 157 mg/dL

## 2010-12-01 LAB — PHOSPHORUS: Phosphorus: 2.8 mg/dL (ref 2.3–4.6)

## 2010-12-01 LAB — TROPONIN I: Troponin I: 2.33 ng/mL (ref 0.00–0.06)

## 2010-12-02 LAB — BASIC METABOLIC PANEL
BUN: 11 mg/dL (ref 6–23)
Chloride: 101 mEq/L (ref 96–112)
Creatinine, Ser: 0.91 mg/dL (ref 0.4–1.2)
Glucose, Bld: 196 mg/dL — ABNORMAL HIGH (ref 70–99)

## 2010-12-02 LAB — CBC
HCT: 41 % (ref 36.0–46.0)
MCV: 86.7 fL (ref 78.0–100.0)
Platelets: 118 10*3/uL — ABNORMAL LOW (ref 150–400)
RDW: 13.8 % (ref 11.5–15.5)
WBC: 4.2 10*3/uL (ref 4.0–10.5)

## 2010-12-02 LAB — POCT CARDIAC MARKERS: Troponin i, poc: <0.05 ng/mL (ref 0.00–0.09)

## 2010-12-02 LAB — DIFFERENTIAL
Basophils Relative: 1 % (ref 0–1)
Eosinophils Absolute: 0.1 10*3/uL (ref 0.0–0.7)
Eosinophils Relative: 2 % (ref 0–5)
Lymphs Abs: 1.8 10*3/uL (ref 0.7–4.0)
Monocytes Absolute: 0.5 10*3/uL (ref 0.1–1.0)
Monocytes Relative: 12 % (ref 3–12)
Neutrophils Relative %: 42 % — ABNORMAL LOW (ref 43–77)

## 2010-12-02 LAB — CK TOTAL AND CKMB (NOT AT ARMC): Total CK: 110 U/L (ref 7–177)

## 2011-04-01 ENCOUNTER — Other Ambulatory Visit: Payer: Self-pay | Admitting: Family Medicine

## 2011-04-01 DIAGNOSIS — I10 Essential (primary) hypertension: Secondary | ICD-10-CM

## 2011-04-01 MED ORDER — LISINOPRIL 20 MG PO TABS: 20.0000 mg | ORAL_TABLET | Freq: Every day | ORAL | Status: DC

## 2011-04-22 ENCOUNTER — Other Ambulatory Visit: Payer: Self-pay | Admitting: Family Medicine

## 2011-04-22 MED ORDER — TRIAMTERENE-HCTZ 75-50 MG PO TABS: 1.0000 | ORAL_TABLET | Freq: Every day | ORAL | Status: DC

## 2011-06-13 ENCOUNTER — Other Ambulatory Visit: Payer: Self-pay | Admitting: Family Medicine

## 2011-06-13 MED ORDER — ROSUVASTATIN CALCIUM 20 MG PO TABS: 20.0000 mg | ORAL_TABLET | Freq: Every day | ORAL | Status: DC

## 2011-06-13 MED ORDER — CLOPIDOGREL BISULFATE 75 MG PO TABS: 75.0000 mg | ORAL_TABLET | Freq: Every day | ORAL | Status: DC

## 2011-06-18 ENCOUNTER — Ambulatory Visit: Payer: Private Health Insurance - Indemnity | Admitting: Family Medicine

## 2011-06-26 ENCOUNTER — Ambulatory Visit (INDEPENDENT_AMBULATORY_CARE_PROVIDER_SITE_OTHER): Payer: Private Health Insurance - Indemnity | Admitting: Family Medicine

## 2011-06-26 ENCOUNTER — Encounter: Payer: Self-pay | Admitting: Family Medicine

## 2011-06-26 VITALS — BP 146/90 | Temp 98.3°F | Ht 69.5 in | Wt 176.0 lb

## 2011-06-26 DIAGNOSIS — F17200 Nicotine dependence, unspecified, uncomplicated: Secondary | ICD-10-CM

## 2011-06-26 DIAGNOSIS — E119 Type 2 diabetes mellitus without complications: Secondary | ICD-10-CM

## 2011-06-26 DIAGNOSIS — E785 Hyperlipidemia, unspecified: Secondary | ICD-10-CM

## 2011-06-26 DIAGNOSIS — I10 Essential (primary) hypertension: Secondary | ICD-10-CM

## 2011-06-26 DIAGNOSIS — Z23 Encounter for immunization: Secondary | ICD-10-CM

## 2011-06-26 MED ORDER — TRIAMTERENE-HCTZ 75-50 MG PO TABS: 1.0000 | ORAL_TABLET | Freq: Every day | ORAL | Status: DC

## 2011-06-26 MED ORDER — CLOPIDOGREL BISULFATE 75 MG PO TABS: 75.0000 mg | ORAL_TABLET | Freq: Every day | ORAL | Status: DC

## 2011-06-26 MED ORDER — LISINOPRIL 20 MG PO TABS: 20.0000 mg | ORAL_TABLET | Freq: Every day | ORAL | Status: DC

## 2011-06-26 MED ORDER — ROSUVASTATIN CALCIUM 20 MG PO TABS: 20.0000 mg | ORAL_TABLET | Freq: Every day | ORAL | Status: DC

## 2011-06-26 MED ORDER — METFORMIN HCL ER 500 MG PO TB24: 1000.0000 mg | ORAL_TABLET | Freq: Every day | ORAL | Status: DC

## 2011-06-26 NOTE — Assessment & Plan Note (Signed)
Fasting lipids in November

## 2011-06-26 NOTE — Progress Notes (Signed)
Here for follow up: 1) HTN: Checks BP at home usually 140s/80s. No CP, Palpitations Edema or syncope.  2) DM: Not checking. He is taking thousand milligrams of metformin a day.  3) Smoking: Tried to quit in the past. Has use chantex and wellbutrin and had quit class. Motivated to quit 7/10 confidence 2/10. No one smoking at home.   PMH reviewed.  ROS as above otherwise neg Medications reviewed.  Exam:  BP 146/90  Temp(Src) 98.3 F (36.8 C) (Oral)  Ht 5' 9.5" (1.765 m)  Wt 176 lb (79.833 kg)  BMI 25.62 kg/m2 Gen: Well NAD HEENT: EOMI,  MMM Lungs: CTABL Nl WOB Heart: RRR no MRG Abd: NABS, NT, ND Exts: Non edematous BL  LE, warm and well perfused.

## 2011-06-26 NOTE — Patient Instructions (Signed)
Thank you for coming in today. Come back in November for a fasting lab draw and blood pressure recheck. I will call in different medicine if we need to.  Think about quitting smoking. Would in the new year be a good time to quit? Come see me in 3 months.  Get your mammogram done. Call the breast center for an appointment.  You dont need paps. You should have a colonoscopy done every 10 years after 55 years old. You are 6 years late.  Ask you friends which doctor they liked and call that office for a screening colonoscopy.

## 2011-06-26 NOTE — Assessment & Plan Note (Signed)
A1c at goal essentially asymptomatic. We'll continue current plan and recheck A1c in 3 months

## 2011-06-26 NOTE — Assessment & Plan Note (Signed)
Blood pressure elevated in clinic today,  but her blood pressure is controlled at home. Plan to continue current medications and come back in early November for nurse visit blood pressure check. If still elevated will increase lisinopril or add amlodipine. Check basic metabolic panel and

## 2011-06-26 NOTE — Assessment & Plan Note (Signed)
Discussed tobacco cessation. Patient will set a quit date for the new year and come in for a dedicated quit visit then.

## 2011-07-11 ENCOUNTER — Telehealth: Payer: Self-pay | Admitting: Family Medicine

## 2011-07-11 NOTE — Telephone Encounter (Signed)
Ms. Brisbin is calling because the Rx's for Lisinopril and Plavix didn't make it to her Pharmacy.  She uses [facility] [address].

## 2011-07-11 NOTE — Telephone Encounter (Signed)
Left message for patient that her meds are at the pharmacy and she can pick them up any time.Busick, Rodena Medin

## 2011-07-25 ENCOUNTER — Other Ambulatory Visit (INDEPENDENT_AMBULATORY_CARE_PROVIDER_SITE_OTHER): Payer: Private Health Insurance - Indemnity

## 2011-07-25 DIAGNOSIS — E785 Hyperlipidemia, unspecified: Secondary | ICD-10-CM

## 2011-07-25 DIAGNOSIS — I10 Essential (primary) hypertension: Secondary | ICD-10-CM

## 2011-07-25 LAB — LIPID PANEL
Cholesterol: 124 mg/dL (ref 0–200)
HDL: 28 mg/dL — ABNORMAL LOW (ref >39)
LDL Cholesterol: 80 mg/dL (ref 0–99)
Triglycerides: 80 mg/dL (ref <150)
VLDL: 16 mg/dL (ref 0–40)

## 2011-07-25 NOTE — Progress Notes (Signed)
cmp and flp done today Amber Suarez 

## 2011-07-26 LAB — COMPLETE METABOLIC PANEL WITH GFR
ALT: 16 U/L (ref 0–35)
AST: 16 U/L (ref 0–37)
BUN: 12 mg/dL (ref 6–23)
Calcium: 8.9 mg/dL (ref 8.4–10.5)
Chloride: 104 mEq/L (ref 96–112)
Creat: 1.06 mg/dL (ref 0.50–1.10)
GFR, Est African American: 68 mL/min — ABNORMAL LOW (ref >89)
Total Bilirubin: 0.7 mg/dL (ref 0.3–1.2)

## 2012-01-28 ENCOUNTER — Encounter (HOSPITAL_COMMUNITY): Payer: Self-pay

## 2012-01-28 ENCOUNTER — Encounter (HOSPITAL_COMMUNITY)
Admission: EM | Disposition: A | Discharge status: Home / Self Care | Payer: Self-pay | Source: Ambulatory Visit | Attending: Cardiology

## 2012-01-28 ENCOUNTER — Inpatient Hospital Stay (HOSPITAL_COMMUNITY)
Admission: EM | Admit: 2012-01-28 | Discharge: 2012-01-29 | DRG: 287 | Disposition: A | Discharge status: Home / Self Care | Payer: Private Health Insurance - Indemnity | Source: Ambulatory Visit | Attending: Cardiology | Admitting: Cardiology

## 2012-01-28 DIAGNOSIS — E119 Type 2 diabetes mellitus without complications: Secondary | ICD-10-CM | POA: Insufficient documentation

## 2012-01-28 DIAGNOSIS — E785 Hyperlipidemia, unspecified: Secondary | ICD-10-CM | POA: Insufficient documentation

## 2012-01-28 DIAGNOSIS — I200 Unstable angina: Secondary | ICD-10-CM | POA: Diagnosis present

## 2012-01-28 DIAGNOSIS — Z79899 Other long term (current) drug therapy: Secondary | ICD-10-CM

## 2012-01-28 DIAGNOSIS — I10 Essential (primary) hypertension: Secondary | ICD-10-CM | POA: Insufficient documentation

## 2012-01-28 DIAGNOSIS — I6789 Other cerebrovascular disease: Secondary | ICD-10-CM | POA: Insufficient documentation

## 2012-01-28 DIAGNOSIS — Z7982 Long term (current) use of aspirin: Secondary | ICD-10-CM

## 2012-01-28 DIAGNOSIS — E1165 Type 2 diabetes mellitus with hyperglycemia: Secondary | ICD-10-CM | POA: Insufficient documentation

## 2012-01-28 DIAGNOSIS — Z6825 Body mass index (BMI) 25.0-25.9, adult: Secondary | ICD-10-CM

## 2012-01-28 DIAGNOSIS — F17200 Nicotine dependence, unspecified, uncomplicated: Secondary | ICD-10-CM | POA: Insufficient documentation

## 2012-01-28 DIAGNOSIS — F3289 Other specified depressive episodes: Secondary | ICD-10-CM | POA: Insufficient documentation

## 2012-01-28 DIAGNOSIS — F329 Major depressive disorder, single episode, unspecified: Secondary | ICD-10-CM | POA: Insufficient documentation

## 2012-01-28 DIAGNOSIS — E663 Overweight: Secondary | ICD-10-CM | POA: Insufficient documentation

## 2012-01-28 DIAGNOSIS — Z8673 Personal history of transient ischemic attack (TIA), and cerebral infarction without residual deficits: Secondary | ICD-10-CM | POA: Insufficient documentation

## 2012-01-28 DIAGNOSIS — I252 Old myocardial infarction: Secondary | ICD-10-CM

## 2012-01-28 DIAGNOSIS — I2510 Atherosclerotic heart disease of native coronary artery without angina pectoris: Principal | ICD-10-CM | POA: Diagnosis present

## 2012-01-28 DIAGNOSIS — E669 Obesity, unspecified: Secondary | ICD-10-CM | POA: Diagnosis present

## 2012-01-28 DIAGNOSIS — Z9861 Coronary angioplasty status: Secondary | ICD-10-CM

## 2012-01-28 HISTORY — DX: Cerebral infarction, unspecified: I63.9

## 2012-01-28 HISTORY — DX: Cardiac murmur, unspecified: R01.1

## 2012-01-28 HISTORY — PX: LEFT HEART CATHETERIZATION WITH CORONARY ANGIOGRAM: SHX5451

## 2012-01-28 LAB — CARDIAC PANEL(CRET KIN+CKTOT+MB+TROPI)
CK, MB: 2 ng/mL (ref 0.3–4.0)
CK, MB: 1.6 ng/mL (ref 0.3–4.0)
Relative Index: 1.4 (ref 0.0–2.5)
Total CK: 148 U/L (ref 7–177)
Total CK: 122 U/L (ref 7–177)
Troponin I: <0.3 ng/mL (ref <0.30)

## 2012-01-28 LAB — CBC
Hemoglobin: 13.6 g/dL (ref 12.0–15.0)
MCV: 83.1 fL (ref 78.0–100.0)
Platelets: 157 10*3/uL (ref 150–400)
RBC: 4.67 MIL/uL (ref 3.87–5.11)
WBC: 6 10*3/uL (ref 4.0–10.5)

## 2012-01-28 LAB — BASIC METABOLIC PANEL
CO2: 31 mEq/L (ref 19–32)
Chloride: 100 mEq/L (ref 96–112)
Glucose, Bld: 116 mg/dL — ABNORMAL HIGH (ref 70–99)
Sodium: 140 mEq/L (ref 135–145)

## 2012-01-28 LAB — GLUCOSE, CAPILLARY: Glucose-Capillary: 133 mg/dL — ABNORMAL HIGH (ref 70–99)

## 2012-01-28 SURGERY — LEFT HEART CATHETERIZATION WITH CORONARY ANGIOGRAM
Anesthesia: LOCAL

## 2012-01-28 MED ORDER — SODIUM CHLORIDE 0.9 % IV SOLN
1.0000 mL/kg/h | INTRAVENOUS | Status: AC
  Administered 2012-01-28: 1 mL/kg/h via INTRAVENOUS

## 2012-01-28 MED ORDER — LIDOCAINE HCL (PF) 1 % IJ SOLN
INTRAMUSCULAR | Status: AC
  Filled 2012-01-28: qty 30

## 2012-01-28 MED ORDER — ONDANSETRON HCL 4 MG/2ML IJ SOLN: 4.0000 mg | Freq: Four times a day (QID) | INTRAMUSCULAR | Status: DC | PRN

## 2012-01-28 MED ORDER — ATORVASTATIN CALCIUM 40 MG PO TABS
40.0000 mg | ORAL_TABLET | Freq: Every day | ORAL | Status: DC
  Administered 2012-01-28: 40 mg via ORAL
  Filled 2012-01-28 (×3): qty 1

## 2012-01-28 MED ORDER — MIDAZOLAM HCL 2 MG/2ML IJ SOLN
INTRAMUSCULAR | Status: AC
  Filled 2012-01-28: qty 2

## 2012-01-28 MED ORDER — LISINOPRIL 20 MG PO TABS
20.0000 mg | ORAL_TABLET | Freq: Every day | ORAL | Status: DC
  Administered 2012-01-29: 20 mg via ORAL
  Filled 2012-01-28: qty 1

## 2012-01-28 MED ORDER — SODIUM CHLORIDE 0.9 % IJ SOLN: 3.0000 mL | Freq: Two times a day (BID) | INTRAMUSCULAR | Status: DC

## 2012-01-28 MED ORDER — SODIUM CHLORIDE 0.9 % IJ SOLN: 3.0000 mL | INTRAMUSCULAR | Status: DC | PRN

## 2012-01-28 MED ORDER — TRIAMTERENE-HCTZ 75-50 MG PO TABS
1.0000 | ORAL_TABLET | Freq: Every day | ORAL | Status: DC
  Administered 2012-01-29: 1 via ORAL
  Filled 2012-01-28: qty 1

## 2012-01-28 MED ORDER — ALPRAZOLAM 0.25 MG PO TABS: 0.2500 mg | ORAL_TABLET | Freq: Two times a day (BID) | ORAL | Status: DC | PRN

## 2012-01-28 MED ORDER — POTASSIUM CHLORIDE 20 MEQ/15ML (10%) PO LIQD
ORAL | Status: AC
  Filled 2012-01-28: qty 30

## 2012-01-28 MED ORDER — ACETAMINOPHEN 325 MG PO TABS: 650.0000 mg | ORAL_TABLET | ORAL | Status: DC | PRN

## 2012-01-28 MED ORDER — POTASSIUM CHLORIDE CRYS ER 20 MEQ PO TBCR
40.0000 meq | EXTENDED_RELEASE_TABLET | Freq: Every day | ORAL | Status: DC
  Administered 2012-01-29: 40 meq via ORAL
  Filled 2012-01-28: qty 2

## 2012-01-28 MED ORDER — HEPARIN (PORCINE) IN NACL 100-0.45 UNIT/ML-% IJ SOLN
1000.0000 [IU]/h | INTRAMUSCULAR | Status: DC
  Administered 2012-01-28: 1000 [IU]/h via INTRAVENOUS
  Filled 2012-01-28: qty 250

## 2012-01-28 MED ORDER — HEPARIN (PORCINE) IN NACL 2-0.9 UNIT/ML-% IJ SOLN
INTRAMUSCULAR | Status: AC
  Filled 2012-01-28: qty 2000

## 2012-01-28 MED ORDER — HEPARIN BOLUS VIA INFUSION: 4000.0000 [IU] | Freq: Once | INTRAVENOUS | Status: DC

## 2012-01-28 MED ORDER — ASPIRIN 325 MG PO TABS
325.0000 mg | ORAL_TABLET | Freq: Every day | ORAL | Status: DC
  Administered 2012-01-29: 325 mg via ORAL
  Filled 2012-01-28: qty 1

## 2012-01-28 MED ORDER — NITROGLYCERIN 0.2 MG/ML ON CALL CATH LAB
INTRAVENOUS | Status: AC
  Filled 2012-01-28: qty 1

## 2012-01-28 MED ORDER — NITROGLYCERIN 0.4 MG SL SUBL: 0.4000 mg | SUBLINGUAL_TABLET | SUBLINGUAL | Status: DC | PRN

## 2012-01-28 MED ORDER — SODIUM CHLORIDE 0.9 % IV SOLN: 250.0000 mL | INTRAVENOUS | Status: DC

## 2012-01-28 MED ORDER — SODIUM CHLORIDE 0.9 % IV SOLN
INTRAVENOUS | Status: DC
  Administered 2012-01-28: 100 mL/h via INTRAVENOUS

## 2012-01-28 MED ORDER — CLOPIDOGREL BISULFATE 75 MG PO TABS: 75.0000 mg | ORAL_TABLET | Freq: Every day | ORAL | Status: DC

## 2012-01-28 MED ORDER — FENTANYL CITRATE 0.05 MG/ML IJ SOLN
INTRAMUSCULAR | Status: AC
  Filled 2012-01-28: qty 2

## 2012-01-28 NOTE — ED Notes (Signed)
Pt with cp since this am, sts that took 2 ntg 5 mins apart and did help the pain seen dr little and sent here.

## 2012-01-28 NOTE — Progress Notes (Signed)
ANTICOAGULATION CONSULT NOTE - Initial Consult  Pharmacy Consult for heparin Indication: chest pain/ACS  Allergies  Allergen Reactions  . Morphine And Related Itching and Other (See Comments)    burning    Patient Measurements: Height: 5\' 9"  (175.3 cm) Weight: 174 lb (78.926 kg) IBW/kg (Calculated) : 66.2  Heparin Dosing Weight: 74kg  Vital Signs: Temp: 98 F (36.7 C) (05/15 1242) Temp src: Oral (05/15 1242) BP: 105/84 mmHg (05/15 1330) Pulse Rate: 55  (05/15 1330)  Labs:  Basename 01/28/12 1329  HGB 13.6  HCT 38.8  PLT 157  APTT --  LABPROT --  INR --  HEPARINUNFRC --  CREATININE --  CKTOTAL --  CKMB --  TROPONINI --    Estimated Creatinine Clearance: 61.2 ml/min (by C-G formula based on Cr of 1.06).   Medical History: Past Medical History  Diagnosis Date  . Hypertension   . Hyperlipidemia   . Diabetes mellitus   . Myocardial infarction    Assessment: 56 year old female presented to office with chest pain transferred to [facility] for evaluation. Patient has an extensive past medical history for CAD, HTN, DM, CVA and ongoing tobacco use. CE's pending. Will start IV heparin. Goal of Therapy:  Heparin level 0.3-0.7 units/ml Monitor platelets by anticoagulation protocol: Yes   Plan:  Heparin IV bolus of 4000 units x1 Heparin IV drip at 1000 units/hr 6 hour HL then daily with CBC  Severiano Gilbert 01/28/2012,2:12 PM

## 2012-01-28 NOTE — ED Notes (Signed)
Patient undressed and in a gown. Cardiac monitor, pulse oximetry, and blood pressure cuff on. 

## 2012-01-28 NOTE — ED Notes (Signed)
Huey Bienenstock [facility] for [facility] cardiology came to see the patient

## 2012-01-28 NOTE — H&P (Addendum)
Amber Suarez is an 56 y.o. female.   Chief Complaint: Chest pain HPI:   The patient is a 56 yo obese AA female with a history of CAD and had an MI in January 2011 with angioplasty to the RCA, which was a small vessel.  She also had 30% proximal and mid LAD disease and 30-40% proximal circ and OM 1 stenosis.  She continues to smoke.  Her history also includes HTN, HLD, DM2, CVA, depression.  Her EF was 55% by cath.  She presents from the office with chest pain which started this morning while she was at work.  It was severe and she rated it at a 10/10.  She had complete resolution with two SL nitroglycerin.  She reports NO associated SOB, diaphoresis, N, V, radiation to arm, neck back or jaw.  She denies congestion, HA, sore throat, orthopnea, PND, LEE, abdominal pain, dysuria, hematuria, melena, hematochezia.  EKG shows new TWI in V4-5.   II, III, aVF are old and were seen on 10/2010 EKG as well as Jan 2011.    Past Medical History  Diagnosis Date  . Hypertension   . Hyperlipidemia   . Diabetes mellitus   . Myocardial infarction     Past Surgical History  Procedure Date  . Abdominal hysterectomy   . Angoiplasty     Family History  Problem Relation Age of Onset  . Diabetes Mother   . Hypertension Mother   . Hypertension Father    Social History:  reports that she has been smoking Cigarettes.  She has a 40 pack-year smoking history. She has never used smokeless tobacco. She reports that she does not drink alcohol or use illicit drugs.  Allergies:  Allergies  Allergen Reactions  . Morphine And Related Itching and Other (See Comments)    burning     (Not in a hospital admission)  No results found for this or any previous visit (from the past 48 hour(s)). No results found.  Review of Systems  Constitutional: Negative for fever and diaphoresis.  HENT: Negative for congestion and sore throat.   Eyes: Negative for blurred vision and double vision.  Respiratory: Negative  for cough, shortness of breath and wheezing.   Cardiovascular: Positive for chest pain. Negative for palpitations, orthopnea, leg swelling and PND.  Gastrointestinal: Positive for heartburn (The patient thought she was having indigestion.). Negative for nausea, vomiting, abdominal pain, diarrhea, constipation, blood in stool and melena.  Genitourinary: Negative for dysuria and hematuria.  Musculoskeletal: Negative for myalgias.  Neurological: Negative for dizziness and headaches.    Blood pressure 134/81, pulse 51, temperature 98 F (36.7 C), temperature source Oral, resp. rate 18, SpO2 98.00%. Physical Exam  Constitutional: She is oriented to person, place, and time. She appears well-developed and well-nourished. No distress.  HENT:  Head: Normocephalic and atraumatic.  Mouth/Throat: No oropharyngeal exudate.  Eyes: EOM are normal. Pupils are equal, round, and reactive to light. No scleral icterus.  Neck: Normal range of motion. Neck supple.  Cardiovascular: Regular rhythm.  Bradycardia present.   No murmur heard. Pulses:      Radial pulses are 2+ on the right side, and 2+ on the left side.       Dorsalis pedis pulses are 2+ on the right side, and 2+ on the left side.       No Carotid or femoral bruits.  Respiratory: Effort normal and breath sounds normal. She has no wheezes. She has no rales.  GI: Soft. Bowel sounds  are normal. She exhibits no distension. There is no tenderness.  Musculoskeletal: She exhibits no edema.  Lymphadenopathy:    She has no cervical adenopathy.  Neurological: She is alert and oriented to person, place, and time. She exhibits normal muscle tone.  Skin: Skin is warm and dry.  Psychiatric: She has a normal mood and affect.     Assessment/Plan Patient Active Hospital Problem List: Unstable angina (01/28/2012)  DIABETES MELLITUS, TYPE II (05/11/2009) HYPERLIPIDEMIA (11/12/2006) Overweight (06/15/2008) TOBACCO DEPENDENCE (11/12/2006) DEPRESSIVE DISORDER, NOS  (11/12/2006) HYPERTENSION, BENIGN ESSENTIAL (11/20/2006) CVA (11/12/2006) Bradycardia  Plan:  CBC, BMET, Cardiac enzymes.  IV heparin.  I will start IV nitro if pain returns.  No beta blocker due to bradycardia.  Rate 48BPM.  Left heart cath today.   HAGER, BRYAN 01/28/2012, 1:31 PM  ATTENDING ATTESTATION:  I have seen and examined the patient along with Wilburt Finlay, PA.  I have reviewed the chart, notes and new data.  I agree with Bryan's note.  Brief Description: 56 y/o woman with prior MI - PTCA to small RCA with mild disease in Left system.  Was in USOH until this AM - sitting @ desk @ work & developed SSCP (that she describes as "kind-of like heartburn, no where near as severe as MI -- yet to Mr. Leron Croak described 10/10 pain).  Lasted ~10 min - she took 2 SL NTG.  Was seen by Dr. Clarene Duke at [facility] OP clinic, who felt her Sx were c/w USA/ACS given TWI (mild ST depressions ~<0.59mm) in inferolateral leads.  He referred her to the [facility] ER with the plan to proceed with urgent cardiac catheterization today. She is currently pain free, just "sleepy."  In NAD.  Exam is essentially benign, with the exception of Sinu Bradycardia --- agree with hold BB (will need lower dose on discharge).  CARDIAC CATH CONSENT.  Performing MD:  Marykay Lex, M.D., M.S.  Procedure:  Left Heart Catheterization with Coronary Angiography and possible Percutaneous Coronary Intervention (balloon/stent)  The procedure with Risks/Benefits/Alternatives and Indications was reviewed with the patient and daughter.  All questions were answered.    Risks / Complications include, but not limited to: Death, MI, CVA/TIA, VF/VT (with defibrillation), Bradycardia (need for temporary pacer placement), contrast induced nephropathy, bleeding / bruising / hematoma / pseudoaneurysm, vascular or coronary injury (with possible emergent CT or Vascular Surgery), adverse medication reactions, infection.    The patient and family voice  understanding and agree to proceed.      Post-Cath, will arrange for smoking cessation counseling.   Marykay Lex, M.D., M.S. [facility] [address] [place], [place]  [address]  [phone]  01/28/2012 2:28 PM

## 2012-01-28 NOTE — CV Procedure (Signed)
[facility]     CARDIAC CATHETERIZATION REPORT  Amber Suarez   MRN: [id number] 04/13/1956   ADMIT DATE:  01/28/2012  Performing Cardiologist: Marykay Lex Primary Physician: Clementeen Graham, MD, MD Primary Cardiologist:  Julieanne Manson, MD  Procedures Performed:  Left Heart Catheterization via 5 Fr Right Common Femoral Artery access  Left Ventriculography, (RAO) 12.5 ml/sec for 25 ml total contrast  Native Coronary Angiography  Indication(s): Resting Chest Pain -- Concerning for Unstable Angina  Prior NSTEMI with distal RCA occlusion -- PTCA only (in multiple locations) due to small vessel size  History: 55 y.o. obese AA female with a history of CAD and had an MI in January 2011 with angioplasty to the RCA, which was a small vessel. She also had 30% proximal and mid LAD disease and 30-40% proximal circ and OM 1 stenosis. She continues to smoke. Her history also includes HTN, HLD, DM2, CVA, depression. Her EF was 55% by cath.  She presented from the [facility] office with chest pain which started this morning while she was at work. It was severe and she rated it at a 10/10. She had complete resolution with two SL nitroglycerin. She reports NO associated SOB, diaphoresis, N, V, radiation to arm, neck back or jaw. She denies congestion, HA, sore throat, orthopnea, PND, LEE, abdominal pain, dysuria, hematuria, melena, hematochezia.   Dr. Clarene Duke recommended coming to ER for cardiac catheterization. EKG shows new TWI in V4-5. II, III, aVF are old and were seen on 10/2010 EKG as well as Jan 2011.   Consent: The procedure with Risks/Benefits/Alternatives and Indications was reviewed with the patient and family.  All questions were answered.    Risks / Complications include, but not limited to: Death, MI, CVA/TIA, VF/VT (with defibrillation), Bradycardia (need for temporary pacer placement), contrast induced nephropathy, bleeding / bruising / hematoma / pseudoaneurysm,  vascular or coronary injury (with possible emergent CT or Vascular Surgery), adverse medication reactions, infection.    The patient and family voice understanding and agree to proceed.    Consent for signed by MD and patient with RN witness -- placed on chart.  Procedure: The patient was brought to the 2nd Floor [facility] in the fasting state and prepped and draped in the usual sterile fashion for Right groin access due to abnormal Allen's test with Plethysmograhy. Sterile technique was used including antiseptics, cap, gloves, gown, hand hygiene, mask and sheet.  Skin prep: Chlorhexidine;  Time Out: Verified patient identification, verified procedure, site/side was marked, verified correct patient position, special equipment/implants available, medications/allergies/relevent history reviewed, required imaging and test results available.  Performed  The right femoral head was identified using tactile and fluoroscopic technique.  The right groin was anesthetized with 1% subcutaneous Lidocaine.  The right Common Femoral Artery was accessed using the Modified Seldinger Technique with placement of a antimicrobial bonded/coated single lumen (5 Fr) sheath.  The sheath was aspirated and flushed.    A 5 Fr JL Catheter was advanced of over a Standard J wire into the ascending Aorta.  The catheter was used to engage the Left Coronary Artery, and multiple cineangiographic views were performed.   Next a 36Fr JR4, followed by a Williams Right catheter were unsuccessful engaging the RCA.  Finally, a 36F AR1 Catheter was used to engage the Right Coronary Artery, and multiple cineangiographic views of the Right Coronary Artery system were performed.  This catheter was then exchanged over the Standard J wire for an angled  Pigtail catheter that was advanced across the Aortic Valve.  LV hemodynamics were measured and Left Ventriculography was performed.  LV hemodynamics were then  re-sampled, and the catheter was pulled back across the Aortic Valve for measurement of "pull-back" gradient.  The catheter and wire were removed completely out of the body.  The patient was transferred to the holding area where the sheath was removed,after ACT checked, with manual pressure held for hemostasis.    The patient was transported to the PACU holding area in a hemodynamically stable, chest pain free condition.   The patient  was stable before, during and following the procedure.   Patient did tolerate procedure well. There were not complications.  EBL: < 10 ml  Medications:  Premedication: 40 mEq Potassium  Sedation:  1 mg IV Versed, 25 IV mcg Fentanyl  Contrast:  110 ml Omnipaque  Hemodynamics:  Central Aortic Pressure / Mean Aortic Pressure: 142/81 mmHg; 106 mmHg  LV Pressure / LV End diastolic Pressure:  137/8 mmHg; 16 mmHg  Left Ventriculography:  EF:  55-60%  Wall Motion: no obvious regional WMA  Coronary Angiographic Data: Left Main:  Large caliber, bifurcates into LAD & Left Circumflex  Left Anterior Descending (LAD):  Large caliber vessel that has multiple Septal perforators and small diagonal branches, it reaches around the apex to the infero-apex; there is a a sequential "step-down" of 2 calcified ~20% lesions in the proximal to mid vessel.  The vessel tapers smoothly to a small caliber distal vessel without any other significant luminal irregularities.   In the mid-vessel, there is a small to moderate caliber "major" diagonal.  Circumflex (LCx):  Large caliber, tortuous vessel that gives off the AV-Groove branch before terminating in a large OM branch with 2 mid branches (the first of which bifurcates) before bifurcating terminally into two branches that almost reach the inferolateral-apex.  Minimal luminal irregularities with proximal ~10-20% irregularities. Right Coronary Artery: High anterior takeoff (used AR1 to engage);  Small caliber (~2.0 mm proximal to  distal) vessel; prominent proximal atrial/SA-nodal branch & mid RV-marginal branch.  Takes a C shaped bend at the genu; mild luminal irregularities until the bifurcation into the Small RPAV and RPDA where there is an ectatic / aneurysmal dilation.    posterior descending artery: Very small caliber, < ~23mm vessel that reaches ~1/2 way down the inferior interventricular groove; diffusely diseased.  Posterior Atrio-Ventricular - posterior lateral branch:  Small, irregular vessel with at least 2 focal ectatic lesions surrounded by moderate lesions.  This vessel is more larger and covers a greater distribution that the RPDA.  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  Preserved LVEF with normal LVEDP.  Consider non-anginal pain vs. Small vessel / microvascular disease.  Plan:  Monitor overnight post cath  Ambulate in Am, and anticipate discharge with continued medical therapy.  Hold BB due to bradycardia.  The case and results was discussed with the patient and family. The case and results was not discussed with the patient's PCP. The case and results was discussed with the patient's Cardiologist.  Time Spend Directly with Patient:  45 minutes  Myelle Poteat W, M.D., M.S. [facility] [address] [place], [place]  [address]  [phone]  01/28/2012 4:28 PM

## 2012-01-29 LAB — CBC
MCV: 83.7 fL (ref 78.0–100.0)
Platelets: 147 10*3/uL — ABNORMAL LOW (ref 150–400)
RBC: 4.48 MIL/uL (ref 3.87–5.11)
RDW: 13.1 % (ref 11.5–15.5)
WBC: 5.3 10*3/uL (ref 4.0–10.5)

## 2012-01-29 LAB — CARDIAC PANEL(CRET KIN+CKTOT+MB+TROPI)
CK, MB: 1.6 ng/mL (ref 0.3–4.0)
Troponin I: <0.3 ng/mL (ref <0.30)

## 2012-01-29 LAB — BASIC METABOLIC PANEL
BUN: 13 mg/dL (ref 6–23)
Chloride: 100 mEq/L (ref 96–112)
Glucose, Bld: 241 mg/dL — ABNORMAL HIGH (ref 70–99)
Potassium: 2.9 mEq/L — ABNORMAL LOW (ref 3.5–5.1)

## 2012-01-29 LAB — HEMOGLOBIN A1C: Hgb A1c MFr Bld: 8.9 % — ABNORMAL HIGH (ref <5.7)

## 2012-01-29 MED ORDER — METFORMIN HCL ER 500 MG PO TB24: 1000.0000 mg | ORAL_TABLET | Freq: Every day | ORAL | Status: DC

## 2012-01-29 MED ORDER — ISOSORBIDE MONONITRATE ER 30 MG PO TB24
30.0000 mg | ORAL_TABLET | Freq: Every day | ORAL | Status: DC
  Administered 2012-01-29: 30 mg via ORAL
  Filled 2012-01-29: qty 1

## 2012-01-29 MED ORDER — POTASSIUM CHLORIDE ER 10 MEQ PO TBCR: 10.0000 meq | EXTENDED_RELEASE_TABLET | Freq: Every day | ORAL | Status: DC

## 2012-01-29 MED ORDER — POTASSIUM CHLORIDE CRYS ER 20 MEQ PO TBCR
40.0000 meq | EXTENDED_RELEASE_TABLET | Freq: Once | ORAL | Status: AC
  Administered 2012-01-29: 40 meq via ORAL
  Filled 2012-01-29: qty 2

## 2012-01-29 MED ORDER — ISOSORBIDE MONONITRATE ER 30 MG PO TB24: 30.0000 mg | ORAL_TABLET | Freq: Every day | ORAL | Status: DC

## 2012-01-29 NOTE — Progress Notes (Signed)
[facility]  DAILY PROGRESS NOTE   Subjective:  No events overnight. Feels "great" this morning. Wants to go home.  Objective:  Temp:  [98 F (36.7 C)-99.5 F (37.5 C)] 98 F (36.7 C) (05/16 0500) Pulse Rate:  [49-123] 62  (05/16 0500) Resp:  [18] 18  (05/16 0500) BP: (101-143)/(52-88) 134/80 mmHg (05/16 0500) SpO2:  [93 %-100 %] 96 % (05/16 0500) Weight:  [78.926 kg (174 lb)] 78.926 kg (174 lb) (05/15 1330) Weight change:   Intake/Output from previous day: 05/15 0701 - 05/16 0700 In: 240 [P.O.:240] Out: 300 [Urine:300]  Intake/Output from this shift:    Medications: Current Facility-Administered Medications  Medication Dose Route Frequency Provider Last Rate Last Dose  . 0.9 %  sodium chloride infusion  1 mL/kg/hr Intravenous Continuous Marykay Lex, MD 78.9 mL/hr at 01/28/12 1730 1 mL/kg/hr at 01/28/12 1730  . 0.9 %  sodium chloride infusion  250 mL Intravenous Continuous Marykay Lex, MD      . acetaminophen (TYLENOL) tablet 650 mg  650 mg Oral Q4H PRN Wilburt Finlay, PA      . acetaminophen (TYLENOL) tablet 650 mg  650 mg Oral Q4H PRN Marykay Lex, MD      . ALPRAZolam Prudy Feeler) tablet 0.25 mg  0.25 mg Oral BID PRN Marykay Lex, MD      . aspirin tablet 325 mg  325 mg Oral Daily Wilburt Finlay, [place]      . atorvastatin (LIPITOR) tablet 40 mg  40 mg Oral q1800 Wilburt Finlay, PA   40 mg at 01/28/12 2118  . fentaNYL (SUBLIMAZE) 0.05 MG/ML injection           . heparin 2-0.9 UNIT/ML-% infusion           . lidocaine (XYLOCAINE) 1 % injection           . lisinopril (PRINIVIL,ZESTRIL) tablet 20 mg  20 mg Oral Daily Wilburt Finlay, PA      . midazolam (VERSED) 2 MG/2ML injection           . nitroGLYCERIN (NITROSTAT) SL tablet 0.4 mg  0.4 mg Sublingual Q5 Min x 3 PRN Wilburt Finlay, PA      . nitroGLYCERIN (NTG ON-CALL) 0.2 mg/mL injection           . ondansetron (ZOFRAN) injection 4 mg  4 mg Intravenous Q6H PRN Marykay Lex, MD      . potassium  chloride SA (K-DUR,KLOR-CON) CR tablet 40 mEq  40 mEq Oral Daily Marykay Lex, MD      . sodium chloride 0.9 % injection 3 mL  3 mL Intravenous Q12H Marykay Lex, MD      . sodium chloride 0.9 % injection 3 mL  3 mL Intravenous PRN Marykay Lex, MD      . triamterene-hydrochlorothiazide (MAXZIDE) 75-50 MG per tablet 1 tablet  1 tablet Oral Daily Wilburt Finlay, [place]      . DISCONTD: 0.9 %  sodium chloride infusion   Intravenous Continuous Wilburt Finlay, PA 100 mL/hr at 01/28/12 1435 100 mL/hr at 01/28/12 1435  . DISCONTD: clopidogrel (PLAVIX) tablet 75 mg  75 mg Oral Daily Wilburt Finlay, [place]      . DISCONTD: heparin ADULT infusion 100 units/mL (25000 units/250 mL)  1,000 Units/hr Intravenous Continuous Severiano Gilbert, PHARMD 10 mL/hr at 01/28/12 1434 1,000 Units/hr at 01/28/12 1434  . DISCONTD: heparin bolus via infusion 4,000 Units  4,000 Units Intravenous Once  Severiano Gilbert, PHARMD      . DISCONTD: ondansetron [facility]) injection 4 mg  4 mg Intravenous Q6H PRN Wilburt Finlay, PA        Physical Exam: General appearance: alert and no distress Neck: no adenopathy, no carotid bruit, no JVD, supple, symmetrical, trachea midline and thyroid not enlarged, symmetric, no tenderness/mass/nodules Lungs: clear to auscultation bilaterally Heart: regular rate and rhythm, S1, S2 normal, no murmur, click, rub or gallop Abdomen: soft, non-tender; bowel sounds normal; no masses,  no organomegaly Extremities: extremities normal, atraumatic, no cyanosis or edema and groin without hematoma or bruit or ecchymosis Pulses: 2+ and symmetric Skin: Skin color, texture, turgor normal. No rashes or lesions Neurologic: Grossly normal  Lab Results: Results for orders placed during the hospital encounter of 01/28/12 (from the past 48 hour(s))  CBC     Status: Normal   Collection Time   01/28/12  1:29 PM      Component Value Range Comment   WBC 6.0  4.0 - 10.5 (K/uL)    RBC 4.67  3.87 - 5.11 (MIL/uL)    Hemoglobin  13.6  12.0 - 15.0 (g/dL)    HCT 16.1  09.6 - 04.5 (%)    MCV 83.1  78.0 - 100.0 (fL)    MCH 29.1  26.0 - 34.0 (pg)    MCHC 35.1  30.0 - 36.0 (g/dL)    RDW 40.9  81.1 - 91.4 (%)    Platelets 157  150 - 400 (K/uL)   BASIC METABOLIC PANEL     Status: Abnormal   Collection Time   01/28/12  1:29 PM      Component Value Range Comment   Sodium 140  135 - 145 (mEq/L)    Potassium 3.0 (*) 3.5 - 5.1 (mEq/L)    Chloride 100  96 - 112 (mEq/L)    CO2 31  19 - 32 (mEq/L)    Glucose, Bld 116 (*) 70 - 99 (mg/dL)    BUN 15  6 - 23 (mg/dL)    Creatinine, Ser 7.82  0.50 - 1.10 (mg/dL)    Calcium 9.4  8.4 - 10.5 (mg/dL)    GFR calc non Af Amer 56 (*) >90 (mL/min)    GFR calc Af Amer 65 (*) >90 (mL/min)   CARDIAC PANEL(CRET KIN+CKTOT+MB+TROPI)     Status: Normal   Collection Time   01/28/12  1:30 PM      Component Value Range Comment   Total CK 148  7 - 177 (U/L)    CK, MB 2.0  0.3 - 4.0 (ng/mL)    Troponin I <0.30  <0.30 (ng/mL)    Relative Index 1.4  0.0 - 2.5    POCT ACTIVATED CLOTTING TIME     Status: Normal   Collection Time   01/28/12  4:18 PM      Component Value Range Comment   Activated Clotting Time 144     GLUCOSE, CAPILLARY     Status: Abnormal   Collection Time   01/28/12  4:50 PM      Component Value Range Comment   Glucose-Capillary 133 (*) 70 - 99 (mg/dL)   TSH     Status: Normal   Collection Time   01/28/12  6:11 PM      Component Value Range Comment   TSH 1.871  0.350 - 4.500 (uIU/mL)   HEMOGLOBIN A1C     Status: Abnormal   Collection Time   01/28/12  6:11 PM  Component Value Range Comment   Hemoglobin A1C 8.9 (*) <5.7 (%)    Mean Plasma Glucose 209 (*) <117 (mg/dL)   CARDIAC PANEL(CRET KIN+CKTOT+MB+TROPI)     Status: Normal   Collection Time   01/28/12  8:59 PM      Component Value Range Comment   Total CK 122  7 - 177 (U/L)    CK, MB 1.6  0.3 - 4.0 (ng/mL)    Troponin I <0.30  <0.30 (ng/mL)    Relative Index 1.3  0.0 - 2.5    CBC     Status: Abnormal    Collection Time   01/29/12  6:14 AM      Component Value Range Comment   WBC 5.3  4.0 - 10.5 (K/uL)    RBC 4.48  3.87 - 5.11 (MIL/uL)    Hemoglobin 12.8  12.0 - 15.0 (g/dL)    HCT 64.4  03.4 - 74.2 (%)    MCV 83.7  78.0 - 100.0 (fL)    MCH 28.6  26.0 - 34.0 (pg)    MCHC 34.1  30.0 - 36.0 (g/dL)    RDW 59.5  63.8 - 75.6 (%)    Platelets 147 (*) 150 - 400 (K/uL)   CARDIAC PANEL(CRET KIN+CKTOT+MB+TROPI)     Status: Normal   Collection Time   01/29/12  6:14 AM      Component Value Range Comment   Total CK 108  7 - 177 (U/L)    CK, MB 1.6  0.3 - 4.0 (ng/mL)    Troponin I <0.30  <0.30 (ng/mL)    Relative Index 1.5  0.0 - 2.5      Imaging: No results found.  Assessment:  1. Principal Problem: 2.  *Unstable angina 3. Active Problems: 4.  DIABETES MELLITUS, TYPE II 5.  HYPERLIPIDEMIA 6.  Overweight 7.  TOBACCO DEPENDENCE 8.  DEPRESSIVE DISORDER, NOS 9.  HYPERTENSION, BENIGN ESSENTIAL 10.  CVA 11.   Plan:  1. Unrevealing cardiac catheterization yesterday without evidence for obstructive CAD. She does have multiple cardiac risk factors and prior RCA angioplasty. Pain was relieved by nitroglycerin - question spasm of the distal RCA? There were new EKG changes which have resolved. Ruled out for ACS by negative cardiac markers. Would follow-up on am BMP - replete K+ if needed, was 3.0 yesterday. Ok for discharge home today. Follow-up with Dr. Herbie Baltimore as an outpatient. Imdur 30 mg daily added to regimen. She is not on a B-blocker due to bradycardia.  Time Spent Directly with Patient:  30 minutes  Length of Stay:  LOS: 1 day   Chrystie Nose, MD, [employer] Attending Cardiologist [facility]  Lonney Revak C 01/29/2012, 8:50 AM

## 2012-01-29 NOTE — Care Management Note (Signed)
    Page 1 of 1   01/29/2012     9:58:30 AM   CARE MANAGEMENT NOTE 01/29/2012  Patient:  Amber Suarez, Amber Suarez   Account Number:  [id number]  Date Initiated:  01/29/2012  Documentation initiated by:  GRAVES-BIGELOW,Rodgerick Gilliand  Subjective/Objective Assessment:   Pt admitted with cp. S/p cardiac cath.     Action/Plan:   Plan for d/c when medically stable.   Anticipated DC Date:  01/30/2012   Anticipated DC Plan:  HOME/SELF CARE      DC Planning Services  CM consult      Choice offered to / List presented to:             Status of service:  Completed, signed off Medicare Important Message given?   (If response is "NO", the following Medicare IM given date fields will be blank) Date Medicare IM given:   Date Additional Medicare IM given:    Discharge Disposition:  HOME/SELF CARE  Per UR Regulation:    If discussed at Long Length of Stay Meetings, dates discussed:    Comments:  01-29-12 0958 Tomi Bamberger, RN,BSN [phone] CM will continue to monitor for disposition needs.

## 2012-01-29 NOTE — Consult Note (Signed)
Pt smokes 1 ppd and is in action stage eager to quit. She is interested in using patches. Recommended 21 mg patch to start with. Discussed patch use instructions and how to taper. Pt voices understanding. Referred to 1-800 quit now for f/u and support. Discussed oral fixation substitutes, second hand smoke and in home smoking policy. Reviewed and gave pt Written education/contact information.

## 2012-01-29 NOTE — Discharge Summary (Signed)
Physician Discharge Summary  Patient ID: Amber Suarez MRN: [id number] DOB/AGE: [birth year] 58 y.o.  Admit date: 01/28/2012 Discharge date: 01/29/2012  Admission Diagnoses:  Chest Pain with EKG changes.  Discharge Diagnoses:  Principal Problem:  *Unstable angina Active Problems:  DIABETES MELLITUS, TYPE II  HYPERLIPIDEMIA  Overweight  TOBACCO DEPENDENCE  DEPRESSIVE DISORDER, NOS  HYPERTENSION, BENIGN ESSENTIAL  CVA BRADYCARDIA HYPOKALEMIA  Discharged Condition: stable  Hospital Course:   The patient is a 56 yo obese AA female with a history of CAD and had an MI in January 2011 with angioplasty to the RCA, which was a small vessel. She also had 30% proximal and mid LAD disease and 30-40% proximal circ and OM 1 stenosis. She continues to smoke. Her history also includes HTN, HLD, DM2, CVA, depression. Her EF was 55% by cath.   She presents from the office with chest pain which started this morning while she was at work. It was severe and she rated it at a 10/10. She had complete resolution with two SL nitroglycerin. She reports NO associated SOB, diaphoresis, N, V, radiation to arm, neck back or jaw. She denies congestion, HA, sore throat, orthopnea, PND, LEE, abdominal pain, dysuria, hematuria, melena, hematochezia.   EKG showed new TWI in V4-5. II, III, aVF are old and were seen on 10/2010 EKG as well as Jan 2011.  The patient was started on IV heparin and taken to the cath lab.  Cath revealed Non-obstructive CAD with diffuse small vessel ectasia (borderline aneurysmal dilation) of the distal RCA system previously treated with PTCA.  Preserved LVEF with normal LVEDP.  Consider non-anginal pain vs. Small vessel / microvascular disease.  Cardiac markers were negative times three.  The patient was not started on a beta blocker due to bradycardia.  A1C was elevated at 8.9.  She will need OP followup at her PCP.   Hypokalemia was repleted.  The pateint will be started on daily potassium .   imdur 30mg  daily was also added.  The patient was seen by Dr. Rennis Golden and felt to be stable for DC home.   Consults: None  Significant Diagnostic Studies:  Procedures Performed:  Left Heart Catheterization via 5 Fr Right Common Femoral Artery access  Left Ventriculography, (RAO) 12.5 ml/sec for 25 ml total contrast  Native Coronary Angiography Indication(s): Resting Chest Pain -- Concerning for Unstable Angina  Prior NSTEMI with distal RCA occlusion -- PTCA only (in multiple locations) due to small vessel size History: 56 y.o. obese AA female with a history of CAD and had an MI in January 2011 with angioplasty to the RCA, which was a small vessel. She also had 30% proximal and mid LAD disease and 30-40% proximal circ and OM 1 stenosis. She continues to smoke. Her history also includes HTN, HLD, DM2, CVA, depression. Her EF was 55% by cath.  She presented from the [facility] office with chest pain which started this morning while she was at work. It was severe and she rated it at a 10/10. She had complete resolution with two SL nitroglycerin. She reports NO associated SOB, diaphoresis, N, V, radiation to arm, neck back or jaw. She denies congestion, HA, sore throat, orthopnea, PND, LEE, abdominal pain, dysuria, hematuria, melena, hematochezia. Dr. Clarene Duke recommended coming to ER for cardiac catheterization.  EKG shows new TWI in V4-5. II, III, aVF are old and were seen on 10/2010 EKG as well as Jan 2011.  Consent: The procedure with Risks/Benefits/Alternatives and Indications was reviewed with the patient  and family. All questions were answered.  Risks / Complications include, but not limited to: Death, MI, CVA/TIA, VF/VT (with defibrillation), Bradycardia (need for temporary pacer placement), contrast induced nephropathy, bleeding / bruising / hematoma / pseudoaneurysm, vascular or coronary injury (with possible emergent CT or Vascular Surgery), adverse medication reactions, infection.  The patient and  family voice understanding and agree to proceed.  Consent for signed by MD and patient with RN witness -- placed on chart.  Procedure: The patient was brought to the 2nd Floor [place] [facility] in the fasting state and prepped and draped in the usual sterile fashion for Right groin access due to abnormal Allen's test with Plethysmograhy. Sterile technique was used including antiseptics, cap, gloves, gown, hand hygiene, mask and sheet.  Skin prep: Chlorhexidine;  Time Out: Verified patient identification, verified procedure, site/side was marked, verified correct patient position, special equipment/implants available, medications/allergies/relevent history reviewed, required imaging and test results available. Performed  The right femoral head was identified using tactile and fluoroscopic technique. The right groin was anesthetized with 1% subcutaneous Lidocaine. The right Common Femoral Artery was accessed using the Modified Seldinger Technique with placement of a antimicrobial bonded/coated single lumen (5 Fr) sheath. The sheath was aspirated and flushed.  A 5 Fr JL Catheter was advanced of over a Standard J wire into the ascending Aorta. The catheter was used to engage the Left Coronary Artery, and multiple cineangiographic views were performed.  Next a 52Fr JR4, followed by a Williams Right catheter were unsuccessful engaging the RCA. Finally, a 52F AR1 Catheter was used to engage the Right Coronary Artery, and multiple cineangiographic views of the Right Coronary Artery system were performed.  This catheter was then exchanged over the Standard J wire for an angled Pigtail catheter that was advanced across the Aortic Valve. LV hemodynamics were measured and Left Ventriculography was performed. LV hemodynamics were then re-sampled, and the catheter was pulled back across the Aortic Valve for measurement of "pull-back" gradient. The catheter and wire were removed completely out of the  body.  The patient was transferred to the holding area where the sheath was removed,after ACT checked, with manual pressure held for hemostasis.  The patient was transported to the PACU holding area in a hemodynamically stable, chest pain free condition.  The patient was stable before, during and following the procedure.  Patient did tolerate procedure well.  There were not complications.  EBL: < 10 ml  Medications:  Premedication: 40 mEq Potassium  Sedation: 1 mg IV Versed, 25 IV mcg Fentanyl  Contrast: 110 ml Omnipaque  Hemodynamics:  Central Aortic Pressure / Mean Aortic Pressure: 142/81 mmHg; 106 mmHg  LV Pressure / LV End diastolic Pressure: 137/8 mmHg; 16 mmHg  Left Ventriculography:  EF: 55-60%  Wall Motion: no obvious regional WMA  Coronary Angiographic Data:  Left Main: Large caliber, bifurcates into LAD & Left Circumflex  Left Anterior Descending (LAD): Large caliber vessel that has multiple Septal perforators and small diagonal branches, it reaches around the apex to the infero-apex; there is a a sequential "step-down" of 2 calcified ~20% lesions in the proximal to mid vessel. The vessel tapers smoothly to a small caliber distal vessel without any other significant luminal irregularities.  In the mid-vessel, there is a small to moderate caliber "major" diagonal.  Circumflex (LCx): Large caliber, tortuous vessel that gives off the AV-Groove branch before terminating in a large OM branch with 2 mid branches (the first of which bifurcates) before bifurcating terminally into  two branches that almost reach the inferolateral-apex.  Minimal luminal irregularities with proximal ~10-20% irregularities. Right Coronary Artery: High anterior takeoff (used AR1 to engage);  Small caliber (~2.0 mm proximal to distal) vessel; prominent proximal atrial/SA-nodal branch & mid RV-marginal branch. Takes a C shaped bend at the genu; mild luminal irregularities until the bifurcation into the Small RPAV  and RPDA where there is an ectatic / aneurysmal dilation.  posterior descending artery: Very small caliber, < ~78mm vessel that reaches ~1/2 way down the inferior interventricular groove; diffusely diseased.  Posterior Atrio-Ventricular - posterior lateral branch: Small, irregular vessel with at least 2 focal ectatic lesions surrounded by moderate lesions. This vessel is more larger and covers a greater distribution that the RPDA. 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  Preserved LVEF with normal LVEDP.  Consider non-anginal pain vs. Small vessel / microvascular disease. Plan:  Monitor overnight post cath  Ambulate in Am, and anticipate discharge with continued medical therapy.  Hold BB due to bradycardia. The case and results was discussed with the patient and family.  The case and results was not discussed with the patient's PCP.  The case and results was discussed with the patient's Cardiologist.  Time Spend Directly with Patient:  45 minutes  HARDING,DAVID W, M.D., M.S.  [facility]  [address]  [place], [place] [address]  [phone]  Treatments: Potassium, IV heparin, Nitro, Imdur  Discharge Exam: Blood pressure 134/80, pulse 62, temperature 98 F (36.7 C), temperature source Oral, resp. rate 18, height 5\' 9"  (1.753 m), weight 78.926 kg (174 lb), SpO2 96.00%.   Disposition:  DC to home.  Discharge Orders    Future Orders Please Complete By Expires   Diet - low sodium heart healthy      Increase activity slowly      Discharge instructions      Comments:   No lifting greater than a gallon of milk for three days.     Medication List  As of 01/29/2012 10:48 AM   TAKE these medications         aspirin 325 MG tablet   Take 325 mg by mouth daily.      clopidogrel 75 MG tablet   Commonly known as: PLAVIX   Take 1  tablet (75 mg total) by mouth daily.      isosorbide mononitrate 30 MG 24 hr tablet   Commonly known as: IMDUR   Take 1 tablet (30 mg total) by mouth daily.      lisinopril 20 MG tablet   Commonly known as: PRINIVIL,ZESTRIL   Take 1 tablet (20 mg total) by mouth daily.      metFORMIN 500 MG 24 hr tablet   Commonly known as: GLUCOPHAGE-XR   Take 2 tablets (1,000 mg total) by mouth daily.      nitroGLYCERIN 0.4 MG SL tablet   Commonly known as: NITROSTAT   Place 0.4 mg under the tongue every 5 (five) minutes as needed. Call 911 if no improvement after 3rd tab      potassium chloride 10 MEQ tablet   Commonly known as: K-DUR   Take 1 tablet (10 mEq total) by mouth daily.      rosuvastatin 20 MG tablet   Commonly known as: CRESTOR   Take 1 tablet (20 mg total) by mouth daily.      triamterene-hydrochlorothiazide 75-50 MG per tablet  Commonly known as: MAXZIDE   Take 1 tablet by mouth daily.             SignedWilburt Finlay 01/29/2012, 10:48 AM

## 2012-01-29 NOTE — Progress Notes (Signed)
UR Completed Aseel Truxillo Graves-Bigelow, RN,BSN [phone]  

## 2012-02-04 NOTE — Discharge Summary (Signed)
[place] C. Tiesha Marich, MD, FACC Attending Cardiologist [facility]  

## 2012-03-21 ENCOUNTER — Ambulatory Visit: Payer: Managed Care, Other (non HMO)

## 2012-03-21 ENCOUNTER — Ambulatory Visit (INDEPENDENT_AMBULATORY_CARE_PROVIDER_SITE_OTHER): Payer: Managed Care, Other (non HMO) | Admitting: Emergency Medicine

## 2012-03-21 VITALS — BP 154/94 | HR 89 | Temp 98.0°F | Resp 16 | Ht 69.0 in | Wt 172.6 lb

## 2012-03-21 DIAGNOSIS — M542 Cervicalgia: Secondary | ICD-10-CM

## 2012-03-21 DIAGNOSIS — M5412 Radiculopathy, cervical region: Secondary | ICD-10-CM

## 2012-03-21 MED ORDER — NAPROXEN SODIUM 550 MG PO TABS: 550.0000 mg | ORAL_TABLET | Freq: Two times a day (BID) | ORAL | Status: AC

## 2012-03-21 MED ORDER — CYCLOBENZAPRINE HCL 10 MG PO TABS: 10.0000 mg | ORAL_TABLET | Freq: Three times a day (TID) | ORAL | Status: AC | PRN

## 2012-03-21 NOTE — Progress Notes (Signed)
Patient Name: Amber Suarez Date of Birth: [birth year] Medical Record Number: [id number] Gender: female Date of Encounter: 03/21/2012  Chief Complaint: Arm Pain   History of Present Illness:  Amber Suarez is a 56 y.o. very pleasant female patient who presents with the following:  Chronic neck issues fell backwards in her work chair a week ago and having pain in her neck on left side and into left arm to below elbow.  Burning pain.  No numbness or tingling or weakness.  Denies dropping things.  No other neurological complaint.  Patient Active Problem List  Diagnosis  . DIABETES MELLITUS, TYPE II  . HYPERLIPIDEMIA  . Overweight  . TOBACCO DEPENDENCE  . DEPRESSIVE DISORDER, NOS  . HYPERTENSION, BENIGN ESSENTIAL  . CVA  . Insomnia  . Unstable angina   Past Medical History  Diagnosis Date  . Hypertension   . Hyperlipidemia   . Myocardial infarction   . Heart murmur   . Stroke   . Diabetes mellitus    Past Surgical History  Procedure Date  . Abdominal hysterectomy   . Angoiplasty    History  Substance Use Topics  . Smoking status: Current Everyday Smoker -- 1.0 packs/day for 40 years    Types: Cigarettes  . Smokeless tobacco: Never Used  . Alcohol Use: No   Family History  Problem Relation Age of Onset  . Diabetes Mother   . Hypertension Mother   . Hypertension Father    Allergies  Allergen Reactions  . Morphine And Related Itching and Other (See Comments)    burning    Medication list has been reviewed and updated.  Current Outpatient Prescriptions on File Prior to Visit  Medication Sig Dispense Refill  . aspirin 325 MG tablet Take 325 mg by mouth daily.        . clopidogrel (PLAVIX) 75 MG tablet Take 1 tablet (75 mg total) by mouth daily.  30 tablet  12  . lisinopril (PRINIVIL,ZESTRIL) 20 MG tablet Take 1 tablet (20 mg total) by mouth daily.  30 tablet  12  . metFORMIN (GLUCOPHAGE-XR) 500 MG 24 hr tablet Take 2 tablets (1,000 mg total) by mouth  daily.  60 tablet  12  . nitroGLYCERIN (NITROSTAT) 0.4 MG SL tablet Place 0.4 mg under the tongue every 5 (five) minutes as needed. Call 911 if no improvement after 3rd tab       . potassium chloride (K-DUR) 10 MEQ tablet Take 1 tablet (10 mEq total) by mouth daily.  30 tablet  11  . rosuvastatin (CRESTOR) 20 MG tablet Take 1 tablet (20 mg total) by mouth daily.  30 tablet  12  . triamterene-hydrochlorothiazide (MAXZIDE) 75-50 MG per tablet Take 1 tablet by mouth daily.  30 tablet  12  . isosorbide mononitrate (IMDUR) 30 MG 24 hr tablet Take 1 tablet (30 mg total) by mouth daily.  30 tablet  5    Review of Systems:  As per HPI, otherwise negative.    Physical Examination: Filed Vitals:   03/21/12 1417  BP: 154/94  Pulse: 89  Temp: 98 F (36.7 C)  Resp: 16   Filed Vitals:   03/21/12 1417  Height: 5\' 9"  (1.753 m)  Weight: 172 lb 9.6 oz (78.291 kg)   Body mass index is 25.49 kg/(m^2). Ideal Body Weight: Weight in (lb) to have BMI = 25: 168.9   GEN: WDWN, NAD, Non-toxic, A & O x 3 HEENT: Atraumatic, Normocephalic. Neck supple. No masses, No LAD. Ears  and Nose: No external deformity. CV: RRR, No M/G/R. No JVD. No thrill. No extra heart sounds. PULM: CTA B, no wheezes, crackles, rhonchi. No retractions. No resp. distress. No accessory muscle use. ABD: S, NT, ND, +BS. No rebound. No HSM. EXTR: No c/c/e NEURO Normal gait. Some decrease in extension left elbow.  Equal grips. PSYCH: Normally interactive. Conversant. Not depressed or anxious appearing.  Calm demeanor.  Neck;  Tender trapezius   EKG / Labs / Xrays: None available at time of encounter  Assessment and Plan: Cspine MRI Anaprox Flexeril Follow up as needed  Carmelina Dane, MD  [facility] reading (PRIMARY) by  Dr. Dareen Piano  Loss of lordotic curve.  osteoarthritis.

## 2012-03-21 NOTE — Addendum Note (Signed)
Addended by: Carmelina Dane on: 03/21/2012 03:46 PM   Modules accepted: Orders

## 2012-03-26 ENCOUNTER — Other Ambulatory Visit (HOSPITAL_COMMUNITY): Payer: Private Health Insurance - Indemnity

## 2012-05-09 ENCOUNTER — Encounter: Payer: Self-pay | Admitting: Family Medicine

## 2012-05-09 ENCOUNTER — Ambulatory Visit (INDEPENDENT_AMBULATORY_CARE_PROVIDER_SITE_OTHER): Payer: Managed Care, Other (non HMO) | Admitting: Family Medicine

## 2012-05-09 VITALS — BP 152/88 | HR 78 | Temp 98.5°F | Resp 16 | Ht 69.5 in | Wt 169.2 lb

## 2012-05-09 DIAGNOSIS — K121 Other forms of stomatitis: Secondary | ICD-10-CM

## 2012-05-09 DIAGNOSIS — K1370 Unspecified lesions of oral mucosa: Secondary | ICD-10-CM

## 2012-05-09 DIAGNOSIS — K1379 Other lesions of oral mucosa: Secondary | ICD-10-CM

## 2012-05-09 DIAGNOSIS — K1230 Oral mucositis (ulcerative), unspecified: Secondary | ICD-10-CM

## 2012-05-09 DIAGNOSIS — E119 Type 2 diabetes mellitus without complications: Secondary | ICD-10-CM

## 2012-05-09 MED ORDER — AMOXICILLIN 500 MG PO CAPS: 500.0000 mg | ORAL_CAPSULE | Freq: Three times a day (TID) | ORAL | Status: AC

## 2012-05-09 NOTE — Progress Notes (Signed)
Subjective:    Patient ID: Amber Suarez, female    DOB: 13-Feb-1956, 56 y.o.   MRN: [id number]  HPI This 56 y.o. female presents for evaluation of swelling of roof of mouth.  Started 48 hours ago.  Dental office closed.  Mild pain with swelling.  No fever/chills/sweats.  No change in toothpaste.  Unable to put partials in mouth due to swelling.  No tongue swelling.  Similar symptoms one year ago; dentist diagnosed with gum disease and recommended all teeth retraction; prescribed antibiotic/Amoxicillin and pain medication with improvement in 48 hours.  Feels exactly the same as one year ago.  No gum swelling.  No throat swelling.  No shortness of breath.  No new foods.  No seafood in past several days.  No allergies to food; +allergic to morphine (itching).  Dentist at [facility].  Has not checked sugars recently; +DMII.   Review of Systems  Constitutional: Negative for fever, chills, diaphoresis and fatigue.  HENT: Positive for mouth sores and dental problem. Negative for ear pain, nosebleeds, congestion, sore throat, facial swelling, rhinorrhea, trouble swallowing, neck pain, neck stiffness, voice change, postnasal drip and ear discharge.   Respiratory: Negative for cough, choking, shortness of breath, wheezing and stridor.   Neurological: Negative for dizziness, facial asymmetry, light-headedness, numbness and headaches.    Past Medical History  Diagnosis Date  . Hypertension   . Hyperlipidemia   . Myocardial infarction   . Heart murmur   . Stroke   . Diabetes mellitus     Past Surgical History  Procedure Date  . Abdominal hysterectomy   . Angoiplasty     Prior to Admission medications   Medication Sig Start Date End Date Taking? Authorizing Provider  aspirin 325 MG tablet Take 325 mg by mouth daily.     Yes Historical Provider, MD  clopidogrel (PLAVIX) 75 MG tablet Take 1 tablet (75 mg total) by mouth daily. 06/26/11  Yes Rodolph Bong, MD  isosorbide mononitrate (IMDUR) 30  MG 24 hr tablet Take 1 tablet (30 mg total) by mouth daily. 01/29/12 01/28/13 Yes Wilburt Finlay, PA  lisinopril (PRINIVIL,ZESTRIL) 20 MG tablet Take 1 tablet (20 mg total) by mouth daily. 06/26/11  Yes Rodolph Bong, MD  metFORMIN (GLUCOPHAGE-XR) 500 MG 24 hr tablet Take 2 tablets (1,000 mg total) by mouth daily. 01/29/12  Yes Wilburt Finlay, PA  naproxen sodium (ANAPROX DS) 550 MG tablet Take 1 tablet (550 mg total) by mouth 2 (two) times daily with a meal. 03/21/12 03/21/13 Yes Phillips Odor, MD  nitroGLYCERIN (NITROSTAT) 0.4 MG SL tablet Place 0.4 mg under the tongue every 5 (five) minutes as needed. Call 911 if no improvement after 3rd tab    Yes Historical Provider, MD  potassium chloride (K-DUR) 10 MEQ tablet Take 1 tablet (10 mEq total) by mouth daily. 01/29/12 01/28/13 Yes Wilburt Finlay, PA  rosuvastatin (CRESTOR) 20 MG tablet Take 1 tablet (20 mg total) by mouth daily. 06/26/11  Yes Rodolph Bong, MD  triamterene-hydrochlorothiazide (MAXZIDE) 75-50 MG per tablet Take 1 tablet by mouth daily. 06/26/11 06/25/12 Yes Rodolph Bong, MD  amoxicillin (AMOXIL) 500 MG capsule Take 1 capsule (500 mg total) by mouth 3 (three) times daily. 05/09/12 05/19/12  Ethelda Chick, MD    Allergies  Allergen Reactions  . Morphine And Related Itching and Other (See Comments)    burning    History   Social History  . Marital Status: Divorced    Spouse Name: N/A  Number of Children: N/A  . Years of Education: N/A   Occupational History  . Not on file.   Social History Main Topics  . Smoking status: Current Everyday Smoker -- 1.0 packs/day for 40 years    Types: Cigarettes  . Smokeless tobacco: Never Used  . Alcohol Use: No  . Drug Use: No  . Sexually Active: Not Currently    Birth Control/ Protection: Surgical   Other Topics Concern  . Not on file   Social History Narrative  . No narrative on file    Family History  Problem Relation Age of Onset  . Diabetes Mother   . Hypertension Mother   .  Hypertension Father       Objective:   Physical Exam  Constitutional: She is oriented to person, place, and time. She appears well-developed and well-nourished. No distress.  HENT:  Head: Normocephalic and atraumatic.  Nose: Nose normal.  Mouth/Throat: Oropharynx is clear and moist.       POOR DENTITION THROUGHOUT; SOFT PALATE WITH MILD DIFFUSE EDEMA/SWELLING WITH 1 CM LOCALIZED SWELLING VERSUS MASS.  NO TONGUE SWELLING; NO OROPHARYNX SWELLING OR ERYTHEMA OR EXUDATE.  NO TONGUE EXUDATE.  NO BUCCAL EXUDATE OR FILM.  NO GUMLINE SWELLING.  Neck: No JVD present. No tracheal deviation present. No thyromegaly present.  Cardiovascular: Normal rate, regular rhythm, normal heart sounds and intact distal pulses.   Pulmonary/Chest: Effort normal and breath sounds normal. No stridor.  Lymphadenopathy:    She has cervical adenopathy.  Neurological: She is alert and oriented to person, place, and time. No cranial nerve deficit. She exhibits normal muscle tone.  Skin: Skin is warm and dry. She is not diaphoretic.  Psychiatric: She has a normal mood and affect. Her behavior is normal. Judgment and thought content normal.   Results for orders placed in visit on 05/09/12  GLUCOSE, POCT (MANUAL RESULT ENTRY)      Component Value Range   POC Glucose 111 (*) 70 - 99 mg/dl      Assessment & Plan:   1. Soft palate edema    2. Stomatitis  amoxicillin (AMOXIL) 500 MG capsule  3. Diabetes mellitus  POCT glucose (manual entry)    1.  Soft palate swelling: New.  Similar to previous episode one year ago. 2.  Stomatitis/Gingivitis: New.  Recurrent.  Rx for Amoxicillin provided; salt water gargles; call dentist for evaluation. 3.  DMII: stable; monitor sugars closely with acute infection.  Glucose stable at 111.

## 2012-05-12 NOTE — Progress Notes (Signed)
Reviewed and agree.

## 2012-06-16 ENCOUNTER — Encounter: Payer: Self-pay | Admitting: Family Medicine

## 2012-07-05 ENCOUNTER — Other Ambulatory Visit: Payer: Self-pay | Admitting: *Deleted

## 2012-07-08 MED ORDER — ROSUVASTATIN CALCIUM 20 MG PO TABS: 20.0000 mg | ORAL_TABLET | Freq: Every day | ORAL | Status: DC

## 2012-07-12 ENCOUNTER — Other Ambulatory Visit: Payer: Self-pay | Admitting: *Deleted

## 2012-07-14 ENCOUNTER — Encounter: Payer: Self-pay | Admitting: Family Medicine

## 2012-07-14 ENCOUNTER — Ambulatory Visit (INDEPENDENT_AMBULATORY_CARE_PROVIDER_SITE_OTHER): Payer: Private Health Insurance - Indemnity | Admitting: Family Medicine

## 2012-07-14 VITALS — BP 164/102 | HR 54 | Ht 69.0 in | Wt 169.0 lb

## 2012-07-14 DIAGNOSIS — E663 Overweight: Secondary | ICD-10-CM

## 2012-07-14 DIAGNOSIS — Z23 Encounter for immunization: Secondary | ICD-10-CM

## 2012-07-14 DIAGNOSIS — I10 Essential (primary) hypertension: Secondary | ICD-10-CM

## 2012-07-14 DIAGNOSIS — E119 Type 2 diabetes mellitus without complications: Secondary | ICD-10-CM

## 2012-07-14 LAB — POCT GLYCOSYLATED HEMOGLOBIN (HGB A1C): Hemoglobin A1C: 7.1

## 2012-07-14 MED ORDER — ISOSORBIDE MONONITRATE ER 30 MG PO TB24: 30.0000 mg | ORAL_TABLET | Freq: Every day | ORAL | Status: DC

## 2012-07-14 MED ORDER — LISINOPRIL 20 MG PO TABS: 20.0000 mg | ORAL_TABLET | Freq: Every day | ORAL | Status: DC

## 2012-07-14 MED ORDER — ROSUVASTATIN CALCIUM 20 MG PO TABS: 20.0000 mg | ORAL_TABLET | Freq: Every day | ORAL | Status: DC

## 2012-07-14 MED ORDER — CLOPIDOGREL BISULFATE 75 MG PO TABS: 75.0000 mg | ORAL_TABLET | Freq: Every day | ORAL | Status: DC

## 2012-07-14 MED ORDER — TRIAMTERENE-HCTZ 75-50 MG PO TABS: 1.0000 | ORAL_TABLET | Freq: Every day | ORAL | Status: DC

## 2012-07-14 NOTE — Patient Instructions (Addendum)
Thank you for coming in today, it was good to see you Your A1c is 7.1 today which is better than previously, continue your current medications Restart your blood pressure medications, follow up in 2 weeks for a nurse visit to recheck your blood pressure Return in 6 months.

## 2012-07-15 LAB — COMPREHENSIVE METABOLIC PANEL
ALT: 8 U/L (ref 0–35)
CO2: 30 mEq/L (ref 19–32)
Calcium: 8.9 mg/dL (ref 8.4–10.5)
Chloride: 104 mEq/L (ref 96–112)
Creat: 0.94 mg/dL (ref 0.50–1.10)
Sodium: 140 mEq/L (ref 135–145)
Total Protein: 7.5 g/dL (ref 6.0–8.3)

## 2012-07-15 LAB — LDL CHOLESTEROL, DIRECT: Direct LDL: 133 mg/dL — ABNORMAL HIGH

## 2012-07-18 NOTE — Assessment & Plan Note (Signed)
Encouraged continued weight loss with lifestyle changes and exercise. Her BMI is at a healthier number.

## 2012-07-18 NOTE — Assessment & Plan Note (Signed)
Diabetees better controlled with A1C 7.1, goal of <7.0.  She has had some weight loss, and I encouraged lifestyle changes and exercise.  Continue current medication.

## 2012-07-18 NOTE — Assessment & Plan Note (Signed)
BP quite elevated today, however she is out of multiple medications.  Will have her restart this and return in 2 weeks for nurse visit for BP recheck.

## 2012-07-18 NOTE — Progress Notes (Signed)
  Subjective:    Patient ID: Amber Suarez, female    DOB: [birth year], 56 y.o.   MRN: [id number]  HPI  1. DM: CHRONIC DIABETES  Disease Monitoring  Blood Sugar Ranges: Does not check at home  Polyuria: no   Visual problems: no   Medication Compliance: yes  Medication Side Effects  Hypoglycemia: no   Preventitive Health Care  Eye Exam: Thinks it was in January of this year  Foot Exam: Today  Diet pattern: Generally healthy, tries to minimalize CHO's.  Exercise: Tries to get 5000 steps per day, uses pedometer to check this.   2. HTN: CHRONIC HYPERTENSION  Disease Monitoring  Blood pressure range: Does not check at home  Chest pain: no   Dyspnea: no   Claudication: no   Medication compliance: yes, however out of multiple medications currently.  Also followed by cardiology for unstable angina  Medication Side Effects  Lightheadedness: no   Urinary frequency: no   Edema: no   Impotence: n/a   Preventitive Healthcare:  Salt Restriction: Tried to limit sodium intake but does not really pay attention of specific foods.   3. Overweight:  Walking frequently.  Trying to make dietary changes and more consistent meals.  She thinks she has lost some weight over the past few months.     Review of Systems Per HPI    Objective:   Physical Exam  Constitutional: She appears well-nourished. No distress.  HENT:  Head: Normocephalic and atraumatic.  Neck: Neck supple.  Cardiovascular: Normal rate, regular rhythm and normal heart sounds.   Pulmonary/Chest: Effort normal and breath sounds normal.  Musculoskeletal: She exhibits no edema.          Assessment & Plan:

## 2012-10-06 ENCOUNTER — Other Ambulatory Visit: Payer: Self-pay | Admitting: Family Medicine

## 2012-10-06 MED ORDER — CLOPIDOGREL BISULFATE 75 MG PO TABS: 75.0000 mg | ORAL_TABLET | Freq: Every day | ORAL | Status: DC

## 2012-12-21 ENCOUNTER — Other Ambulatory Visit: Payer: Self-pay | Admitting: Family Medicine

## 2012-12-21 MED ORDER — ROSUVASTATIN CALCIUM 20 MG PO TABS: 20.0000 mg | ORAL_TABLET | Freq: Every day | ORAL | Status: DC

## 2012-12-27 ENCOUNTER — Ambulatory Visit: Payer: Private Health Insurance - Indemnity | Admitting: Family Medicine

## 2012-12-30 ENCOUNTER — Encounter: Payer: Self-pay | Admitting: Family Medicine

## 2012-12-30 ENCOUNTER — Ambulatory Visit (INDEPENDENT_AMBULATORY_CARE_PROVIDER_SITE_OTHER): Payer: Private Health Insurance - Indemnity | Admitting: Family Medicine

## 2012-12-30 VITALS — BP 135/86 | HR 61 | Ht 69.0 in | Wt 170.0 lb

## 2012-12-30 DIAGNOSIS — I10 Essential (primary) hypertension: Secondary | ICD-10-CM

## 2012-12-30 DIAGNOSIS — E119 Type 2 diabetes mellitus without complications: Secondary | ICD-10-CM

## 2012-12-30 MED ORDER — ROSUVASTATIN CALCIUM 20 MG PO TABS: 20.0000 mg | ORAL_TABLET | Freq: Every day | ORAL | Status: DC

## 2012-12-30 NOTE — Patient Instructions (Addendum)
Thank you for coming in today, it was good to see you Your blood pressure and diabetes are well controlled today. Follow up with your cardiologist as scheduled Follow up at our practice in 3 months.

## 2013-01-06 NOTE — Assessment & Plan Note (Signed)
Well controlled, no changes to medication. Follow up in three months.

## 2013-01-06 NOTE — Progress Notes (Signed)
  Subjective:    Patient ID: Amber Suarez, female    DOB: [birth year], 57 y.o.   MRN: [id number]  Diabetes  Hypertension    1. CHRONIC HYPERTENSION  Disease Monitoring  Blood pressure range: No self monitoring at home  Chest pain: no   Dyspnea: no   Claudication: no   Medication compliance: yes  Medication Side Effects  Lightheadedness: no   Urinary frequency: no   Edema: no   Preventitive Healthcare:  Exercise: yes, occasional walking   Diet Pattern: Generally healthy  Followed at [facility] for angina and HTN as well.   2. CHRONIC DIABETES  Disease Monitoring  Blood Sugar Ranges: Does not typically check   Polyuria: no   Visual problems: no   Medication Compliance: yes  Medication Side Effects  Hypoglycemia: no   Preventitive Health Care  Eye Exam: Upcoming soon   Review of Systems Per HPI    Objective:   Physical Exam  Constitutional:  Obese female, nad   HENT:  Head: Normocephalic and atraumatic.  Eyes: No scleral icterus.  Neck: Neck supple.  Cardiovascular: Normal rate and regular rhythm.   Pulmonary/Chest: Effort normal and breath sounds normal.  Musculoskeletal: She exhibits no edema.          Assessment & Plan:

## 2013-01-06 NOTE — Assessment & Plan Note (Signed)
Well controlled.  NO changes to medication.  Plans to follow up with her cardiologist.

## 2013-03-18 ENCOUNTER — Other Ambulatory Visit: Payer: Self-pay | Admitting: Family Medicine

## 2013-03-24 ENCOUNTER — Other Ambulatory Visit: Payer: Self-pay

## 2013-04-23 ENCOUNTER — Other Ambulatory Visit: Payer: Self-pay | Admitting: Family Medicine

## 2013-06-23 ENCOUNTER — Telehealth: Payer: Self-pay | Admitting: Family Medicine

## 2013-06-23 DIAGNOSIS — I10 Essential (primary) hypertension: Secondary | ICD-10-CM

## 2013-06-23 MED ORDER — ROSUVASTATIN CALCIUM 20 MG PO TABS: 20.0000 mg | ORAL_TABLET | Freq: Every day | ORAL | Status: DC

## 2013-06-23 MED ORDER — LISINOPRIL 20 MG PO TABS: 20.0000 mg | ORAL_TABLET | Freq: Every day | ORAL | Status: DC

## 2013-06-23 MED ORDER — TRIAMTERENE-HCTZ 75-50 MG PO TABS: 1.0000 | ORAL_TABLET | Freq: Every day | ORAL | Status: DC

## 2013-06-23 MED ORDER — CLOPIDOGREL BISULFATE 75 MG PO TABS: 75.0000 mg | ORAL_TABLET | Freq: Every day | ORAL | Status: DC

## 2013-06-23 NOTE — Telephone Encounter (Signed)
Pharmacy:[facility] on [address] Pt needs refills on the following:plavix, lisinopril, K-dur,  Crestor, maxzide

## 2013-06-23 NOTE — Telephone Encounter (Signed)
Refilled Plavix, lisinopril, Crestor, Maxzide. Pt's K-Dur looks like it was last filled in May and she has not been seen since then. Would prefer not to re-prescribe K-Dur this without an office visit -- unsure if this or any other meds are managed by cardiology. Pt can follow up at her convenience. Thanks! --CMS

## 2013-10-27 ENCOUNTER — Encounter: Payer: Self-pay | Admitting: Family Medicine

## 2013-10-27 ENCOUNTER — Ambulatory Visit (INDEPENDENT_AMBULATORY_CARE_PROVIDER_SITE_OTHER): Payer: Private Health Insurance - Indemnity | Admitting: Family Medicine

## 2013-10-27 VITALS — BP 143/95 | HR 85 | Ht 69.0 in | Wt 167.0 lb

## 2013-10-27 DIAGNOSIS — R5383 Other fatigue: Secondary | ICD-10-CM

## 2013-10-27 DIAGNOSIS — I6789 Other cerebrovascular disease: Secondary | ICD-10-CM

## 2013-10-27 DIAGNOSIS — R5381 Other malaise: Secondary | ICD-10-CM | POA: Insufficient documentation

## 2013-10-27 DIAGNOSIS — I252 Old myocardial infarction: Secondary | ICD-10-CM | POA: Insufficient documentation

## 2013-10-27 DIAGNOSIS — I200 Unstable angina: Secondary | ICD-10-CM

## 2013-10-27 DIAGNOSIS — F3289 Other specified depressive episodes: Secondary | ICD-10-CM

## 2013-10-27 DIAGNOSIS — E119 Type 2 diabetes mellitus without complications: Secondary | ICD-10-CM

## 2013-10-27 DIAGNOSIS — E785 Hyperlipidemia, unspecified: Secondary | ICD-10-CM

## 2013-10-27 DIAGNOSIS — I10 Essential (primary) hypertension: Secondary | ICD-10-CM

## 2013-10-27 DIAGNOSIS — G4700 Insomnia, unspecified: Secondary | ICD-10-CM

## 2013-10-27 DIAGNOSIS — F17200 Nicotine dependence, unspecified, uncomplicated: Secondary | ICD-10-CM

## 2013-10-27 DIAGNOSIS — Z23 Encounter for immunization: Secondary | ICD-10-CM

## 2013-10-27 DIAGNOSIS — F329 Major depressive disorder, single episode, unspecified: Secondary | ICD-10-CM

## 2013-10-27 LAB — CBC
HCT: 42.7 % (ref 36.0–46.0)
Hemoglobin: 14.9 g/dL (ref 12.0–15.0)
MCH: 29 pg (ref 26.0–34.0)
MCHC: 34.9 g/dL (ref 30.0–36.0)
MCV: 83.1 fL (ref 78.0–100.0)
PLATELETS: 159 10*3/uL (ref 150–400)
RBC: 5.14 MIL/uL — ABNORMAL HIGH (ref 3.87–5.11)
RDW: 14.5 % (ref 11.5–15.5)
WBC: 6.3 10*3/uL (ref 4.0–10.5)

## 2013-10-27 LAB — LIPID PANEL
CHOLESTEROL: 105 mg/dL (ref 0–200)
HDL: 24 mg/dL — ABNORMAL LOW (ref >39)
LDL Cholesterol: 50 mg/dL (ref 0–99)
TRIGLYCERIDES: 156 mg/dL — AB (ref <150)
Total CHOL/HDL Ratio: 4.4 Ratio
VLDL: 31 mg/dL (ref 0–40)

## 2013-10-27 LAB — COMPREHENSIVE METABOLIC PANEL
ALBUMIN: 4 g/dL (ref 3.5–5.2)
ALT: 12 U/L (ref 0–35)
AST: 12 U/L (ref 0–37)
Alkaline Phosphatase: 70 U/L (ref 39–117)
BUN: 12 mg/dL (ref 6–23)
CALCIUM: 9.3 mg/dL (ref 8.4–10.5)
CHLORIDE: 99 meq/L (ref 96–112)
CO2: 31 meq/L (ref 19–32)
CREATININE: 0.95 mg/dL (ref 0.50–1.10)
Glucose, Bld: 192 mg/dL — ABNORMAL HIGH (ref 70–99)
POTASSIUM: 3 meq/L — AB (ref 3.5–5.3)
SODIUM: 139 meq/L (ref 135–145)
TOTAL PROTEIN: 7.3 g/dL (ref 6.0–8.3)
Total Bilirubin: 0.6 mg/dL (ref 0.2–1.2)

## 2013-10-27 LAB — POCT GLYCOSYLATED HEMOGLOBIN (HGB A1C): HEMOGLOBIN A1C: 8.3

## 2013-10-27 LAB — TSH: TSH: 1.181 u[IU]/mL (ref 0.350–4.500)

## 2013-10-27 MED ORDER — METFORMIN HCL ER 500 MG PO TB24: 1000.0000 mg | ORAL_TABLET | Freq: Every day | ORAL | Status: DC

## 2013-10-27 MED ORDER — TRIAMTERENE-HCTZ 75-50 MG PO TABS: 1.0000 | ORAL_TABLET | Freq: Every day | ORAL | Status: DC

## 2013-10-27 MED ORDER — ISOSORBIDE MONONITRATE ER 30 MG PO TB24: 30.0000 mg | ORAL_TABLET | Freq: Every day | ORAL | Status: DC

## 2013-10-27 MED ORDER — LISINOPRIL 20 MG PO TABS: 20.0000 mg | ORAL_TABLET | Freq: Every day | ORAL | Status: DC

## 2013-10-27 MED ORDER — ROSUVASTATIN CALCIUM 20 MG PO TABS: 20.0000 mg | ORAL_TABLET | Freq: Every day | ORAL | Status: DC

## 2013-10-27 MED ORDER — CLOPIDOGREL BISULFATE 75 MG PO TABS: 75.0000 mg | ORAL_TABLET | Freq: Every day | ORAL | Status: DC

## 2013-10-27 NOTE — Assessment & Plan Note (Signed)
Smoking 1 pack per day, and has tried to quit in the past (on her own, with counseling, and with Wellbutrin and Chantix). Interested in quitting but she is not hopeful that she can. Provided 1-800-QUIT-NOW number and will address at next follow-up. Would benefit greatly from cessation.

## 2013-10-27 NOTE — Assessment & Plan Note (Signed)
Long-standing, unsure if related to chronic illnesses (HTN, DM which are poorly controlled, reported hx of CVA and MI) or undiagnosed / untreated hypothyroidism or anemia. Chart review lists depression as a diagnosis, but pt denies current symptoms and is on no medications. Has tried trazodone in the past without help. Plan to check CBC and TSH checked today, will f/u as needed. Otherwise recommended good sleep hygiene and asked pt to return for dedicated visit for this if needed.

## 2013-10-27 NOTE — Assessment & Plan Note (Signed)
A: Pt has not been seen almost 1 year and reports compliance with medications, though not clear if last refills would have lasted until now. Not checking CBG's at home, though denies hypo- or hyperglycemic episodes. A1c just over 8.  P: Refilled metformin, today, as well as ACE, statin. Continue ASA as well. Blood work drawn today (see separate problem list notes), will follow-up as needed. Next A1c in 3 months.

## 2013-10-27 NOTE — Assessment & Plan Note (Signed)
Per pt history. Currently on ASA and statin as well as treatment for DM and HTN (poorly controlled, currently), and still smoking, but is without obvious residual deficit and no reported new deficit. Consider increase statin, but otherwise f/u as needed.

## 2013-10-27 NOTE — Assessment & Plan Note (Signed)
Reported hx of MI, not on beta-blocker, and with poorly controlled HTN, questionably controlled DM, and still smoking. No current complaints. Rx for Imdur refilled today. Monitor as needed.

## 2013-10-27 NOTE — Progress Notes (Signed)
   Subjective:    Patient ID: Amber Suarez, female    DOB: 10/22/1955, 58 y.o.   MRN: [id number]  HPI: Pt presents to clinic for general follow-up. She is out of all her medications.  DM - Does not check sugar at home, does not have meter - reports compliance with metformin, denies difficulty taking it or side effects - denies specific hypo- or hyperglycemic episode type symptoms  HTN - reports compliance Maxide and lisinopril - checks BP at home infrequently, usually runs 140's / 90's - denies chest pain, LE swelling, SOB, changes in vision - has been on K-Dur in the past but has not taken in several months  HLD - reports compliance with Crestor; believes she has been intolerant of Lipitor and Zocor in the past - denies body aches / muscle aches  Hx of stroke / MI - reports taking daily aspirin and medications as above - has some residual left hand weakness from stroke - denies new weakness / paresthesias, chest pain, or other new symptoms - is not on a beta-blocker and is not sure if she ever has been or if she is intolerant  Pt is a current smoker. She is interested in quitting but has tried in the past without success, both on her own and with assistance. Has taken both Wellbutrin and Chantix in the past. In addition to the above documentation, pt's PMH, surgical history, FH, and SH all reviewed and updated where appropriate in the EMR. I have also reviewed and updated the pt's allergies and current medications as appropriate.  Review of Systems: As above. She does admit to some long-standing insomnia. Otherwise, full 12-system ROS was reviewed and all negative. PHQ-2 negative.     Objective:   Physical Exam BP 143/95  Pulse 85  Ht 5\' 9"  (1.753 m)  Wt 167 lb (75.751 kg)  BMI 24.65 kg/m2 Gen: well-appearing adult female in NAD HEENT: [place]/AT, sclerae/conjunctivae clear, no lid lag, EOMI, PERRLA   MMM, posterior oropharynx clear, no cervical lymphadenopathy  neck supple  with full ROM, no masses appreciated; thyroid not enlarged  Cardio: RRR, with soft blowing systolic murmur; distal pulses intact/symmetric Pulm: CTAB, no wheezes, normal WOB  Abd: soft, nondistended, BS+, no HSM Ext: warm/well-perfused, no cyanosis/clubbing/edema MSK: strength 5/5 in all four extremities, no frank joint deformity/effusion  normal ROM to all four extremities with no point muscle/bony tenderness in spine Neuro/Psych: alert/oriented, sensation grossly intact; normal gait/balance  mood euthymic with congruent affect Diabetic foot exam: See relevant section of EPIC. Grossly normal sensation, few callouses but no ulcers / skin breakdown.     Assessment & Plan:  See individual problem list notes.

## 2013-10-27 NOTE — Assessment & Plan Note (Signed)
Per history; no current complaints and PHQ-2 negative, and not currently on any medications. Plan to monitor at regular follow-up and consider starting medications vs referral to therapy, as needed.

## 2013-10-27 NOTE — Assessment & Plan Note (Signed)
Elevated today, 140's / 90's, and pt reports compliance with lisinopril and Maxzide, though unsure if previous prescriptions would have lasted until now. Asymptomatic. Labs drawn today (suggests degree of CKD, ?related to HTN and DM), Rx's refilled today. Will monitor at follow-up.

## 2013-10-27 NOTE — Patient Instructions (Signed)
Thank you for coming in, today!  I will refill your medicines, today. I will check your liver, kidneys, and cholesterol, today. I will also check your thyroid. I will call you if you need to do anything about your labs, or I will write you a letter, if everything looks okay.  For your smoking, you can call 1-800-QUIT-NOW completely free for help cutting back and stopping. We will give you information on getting your mammogram and colonoscopy. We will give you a pneumonia shot today. Come back to see me in 1-2 months.  Please feel free to call with any questions or concerns at any time, at [phone]. --Dr. Casper HarrisonStreet

## 2013-10-27 NOTE — Assessment & Plan Note (Signed)
Possibly related to long-standing insomnia; see that problem list note. Otherwise will check CBC and TSH, today and follow-up as needed.

## 2013-10-27 NOTE — Assessment & Plan Note (Signed)
Reports compliance with Crestor, but uncertain if last Rx's would have lasted until now (last visit was just shy of 1 year ago). No body aches / muscle aches. Liver function normal, though LDL >130. Refilled Crestor today, though could consider increasing to 40 mg especially given hx of stroke and MI. Next lipid panel and CMP in 6-12 months.

## 2013-11-02 ENCOUNTER — Encounter: Payer: Self-pay | Admitting: Family Medicine

## 2014-02-01 ENCOUNTER — Telehealth: Payer: Self-pay | Admitting: Family Medicine

## 2014-02-01 ENCOUNTER — Ambulatory Visit (INDEPENDENT_AMBULATORY_CARE_PROVIDER_SITE_OTHER): Payer: Self-pay | Admitting: Family Medicine

## 2014-02-01 ENCOUNTER — Encounter: Payer: Self-pay | Admitting: Family Medicine

## 2014-02-01 ENCOUNTER — Ambulatory Visit (HOSPITAL_COMMUNITY)
Admission: RE | Admit: 2014-02-01 | Discharge: 2014-02-01 | Disposition: A | Discharge status: Home / Self Care | Payer: No Typology Code available for payment source | Source: Ambulatory Visit | Attending: Family Medicine | Admitting: Family Medicine

## 2014-02-01 VITALS — BP 155/98 | HR 93 | Temp 98.1°F | Wt 165.0 lb

## 2014-02-01 DIAGNOSIS — J329 Chronic sinusitis, unspecified: Secondary | ICD-10-CM

## 2014-02-01 DIAGNOSIS — Z87891 Personal history of nicotine dependence: Secondary | ICD-10-CM | POA: Insufficient documentation

## 2014-02-01 DIAGNOSIS — J019 Acute sinusitis, unspecified: Secondary | ICD-10-CM | POA: Insufficient documentation

## 2014-02-01 MED ORDER — AZITHROMYCIN 250 MG PO TABS: ORAL_TABLET | ORAL | Status: DC

## 2014-02-01 MED ORDER — POTASSIUM CHLORIDE ER 10 MEQ PO TBCR: 10.0000 meq | EXTENDED_RELEASE_TABLET | Freq: Every day | ORAL | Status: DC

## 2014-02-01 NOTE — Assessment & Plan Note (Addendum)
Patient with what appears to be acute sinusitis today. Of concern for diminished breath sounds right greater than left today. Will get a chest x-ray to rule out pneumonia. Started on azithromycin taper today encouraged her to take daily antihistamine.  Refilled potassium prescription, instructed to take 2 today and tomorrow, then one daily thereafter. This could be possible reasoning of chronic fatigue.  He should followup in 2 weeks or sooner if needed

## 2014-02-01 NOTE — Telephone Encounter (Signed)
Please cal Willis and inform her that her cxr is normal. Thanks

## 2014-02-01 NOTE — Progress Notes (Signed)
   Subjective:    Patient ID: Amber Suarez, female    DOB: 11/15/1955, 58 y.o.   MRN: [id number]  HPI Amber Suarez is a 58 y.o. AAF presents to clinic with complaints of cough for 3 weeks.   Cough: Patient states that she's had a cough for the last 3 weeks it is sometimes productive with a light colored mucus. She states she feels that her cough is worse at night. She denies fever, chills, night sweats, nausea, vomiting, bloody nose or diarrhea. She endorses congestion, facial pressure in eyes and fatigue.  She states she has not taken anything over-the-counter because she is scared to take anyover the counter occasions. She is not on a antihistamine.  She states her Appetite is decreased d/t lack of taste. Drinks plenty of fluids. Smokes daily. Of note she's had low potassium on recent lab work and is not on supplementation.  Review of Systems Negative, with the exception of above mentioned in HPI  Objective:   Physical Exam BP 155/98  Pulse 93  Temp(Src) 98.1 F (36.7 C) (Oral)  Wt 165 lb (74.844 kg)  SpO2 95% Gen: Pleasant African American female. No acute distress nontoxic in appearance. HEENT: AT. [place]. Bilateral TM visualized and normal in appearance. Bilateral eyes without injections or icterus. Positive facial tenderness over maxillary sinuses bilaterally. MMM. Bilateral nares with erythema and swelling. Throat without erythema or exudates.  Neck: No notable lymphadenopathy CV: RRR  Chest: Clear to auscultation left lung. Right lung with diminished breath sounds throughout no wheezing or crackling noted.

## 2014-02-01 NOTE — Patient Instructions (Signed)

## 2014-02-08 ENCOUNTER — Telehealth: Payer: Self-pay | Admitting: Family Medicine

## 2014-02-08 MED ORDER — BENZONATATE 100 MG PO CAPS: 100.0000 mg | ORAL_CAPSULE | Freq: Two times a day (BID) | ORAL | Status: DC | PRN

## 2014-02-08 NOTE — Telephone Encounter (Signed)
Forward to Dr Casper Harrison who is patient's PCP, she was seen by Dr Claiborne Billings for this problem on 02/01/14 but she is currently unavailable.Amber Suarez

## 2014-02-08 NOTE — Telephone Encounter (Signed)
Called patient and read message from Dr Casper Harrison to her.Amber Suarez

## 2014-02-08 NOTE — Telephone Encounter (Signed)
Pt can try over-the-counter medications for cough (Robitussin or things containing dextromethorphan, etc). If she is still having cough and over-the-counter medications are not helping, she can try Tessalon perles (I will send this Rx in, but it may be expensive). Her lisinopril may be contributing, and if she is still smoking this may also be contributing. If over-the-counter meds don't help or if she can't afford the Tessalon, she should come back in to be checked out; she can schedule follow up with me to discuss her blood pressure medicines and potentially stopping the lisinopril, as well. Thanks! --CMS

## 2014-02-08 NOTE — Telephone Encounter (Signed)
Would like some cough medicine Please advise

## 2014-05-29 ENCOUNTER — Other Ambulatory Visit: Payer: Self-pay | Admitting: *Deleted

## 2014-05-29 ENCOUNTER — Encounter: Payer: Self-pay | Admitting: *Deleted

## 2014-05-29 DIAGNOSIS — I200 Unstable angina: Secondary | ICD-10-CM

## 2014-05-29 MED ORDER — ISOSORBIDE MONONITRATE ER 30 MG PO TB24: 30.0000 mg | ORAL_TABLET | Freq: Every day | ORAL | Status: DC

## 2014-05-29 NOTE — Progress Notes (Signed)
Prior Authorization received from [employer] for Crestor 20 mg.  PA form placed in provider box for completion. Clovis Pu, RN

## 2014-05-30 NOTE — Progress Notes (Signed)
Reviewed pt's chart and attempted to contact pt; phone number on Rx from [facility] [phone] was not answered (?busy signal, unusual-sounding beeping) and pt's mother listed as her emergency contact did not have another number to contact her. Based on chart review, it seems pt has been on lovastatin at various doses with inadequate response / lowering of cholesterol and may have been intolerant to other statins prior to that (?pravastatin). Completed PA form and returned to Darcella Cheshire, RN. Thanks. --CMS

## 2014-05-30 NOTE — Progress Notes (Signed)
PA faxed to [facility] for review. Drago Hammonds L, RN  

## 2014-05-31 NOTE — Progress Notes (Signed)
PA approved through [facility] 05/31/2014 to 05/31/2017.  Authorization number [id number].  [facility] pharmacy aware of approval.  Clovis Pu, RN

## 2014-06-25 ENCOUNTER — Other Ambulatory Visit: Payer: Self-pay | Admitting: Family Medicine

## 2014-08-03 ENCOUNTER — Other Ambulatory Visit: Payer: Self-pay | Admitting: Family Medicine

## 2014-08-24 ENCOUNTER — Encounter (HOSPITAL_COMMUNITY): Payer: Self-pay | Admitting: Cardiology

## 2014-11-04 ENCOUNTER — Other Ambulatory Visit: Payer: Self-pay | Admitting: Family Medicine

## 2015-01-15 DIAGNOSIS — Z0271 Encounter for disability determination: Secondary | ICD-10-CM

## 2015-03-05 ENCOUNTER — Other Ambulatory Visit: Payer: Self-pay | Admitting: Family Medicine

## 2015-03-09 ENCOUNTER — Encounter: Payer: Self-pay | Admitting: *Deleted

## 2015-03-27 ENCOUNTER — Encounter (HOSPITAL_COMMUNITY): Payer: Self-pay

## 2015-03-27 ENCOUNTER — Emergency Department (HOSPITAL_COMMUNITY)
Admission: EM | Admit: 2015-03-27 | Discharge: 2015-03-27 | Disposition: A | Discharge status: Home / Self Care | Payer: No Typology Code available for payment source | Attending: Emergency Medicine | Admitting: Emergency Medicine

## 2015-03-27 DIAGNOSIS — K0381 Cracked tooth: Secondary | ICD-10-CM | POA: Insufficient documentation

## 2015-03-27 DIAGNOSIS — R011 Cardiac murmur, unspecified: Secondary | ICD-10-CM | POA: Insufficient documentation

## 2015-03-27 DIAGNOSIS — E785 Hyperlipidemia, unspecified: Secondary | ICD-10-CM | POA: Insufficient documentation

## 2015-03-27 DIAGNOSIS — Z9889 Other specified postprocedural states: Secondary | ICD-10-CM | POA: Insufficient documentation

## 2015-03-27 DIAGNOSIS — K047 Periapical abscess without sinus: Secondary | ICD-10-CM | POA: Insufficient documentation

## 2015-03-27 DIAGNOSIS — Z79899 Other long term (current) drug therapy: Secondary | ICD-10-CM | POA: Insufficient documentation

## 2015-03-27 DIAGNOSIS — I10 Essential (primary) hypertension: Secondary | ICD-10-CM | POA: Insufficient documentation

## 2015-03-27 DIAGNOSIS — Z8673 Personal history of transient ischemic attack (TIA), and cerebral infarction without residual deficits: Secondary | ICD-10-CM | POA: Insufficient documentation

## 2015-03-27 DIAGNOSIS — I252 Old myocardial infarction: Secondary | ICD-10-CM | POA: Insufficient documentation

## 2015-03-27 DIAGNOSIS — K029 Dental caries, unspecified: Secondary | ICD-10-CM | POA: Insufficient documentation

## 2015-03-27 DIAGNOSIS — Z720 Tobacco use: Secondary | ICD-10-CM | POA: Insufficient documentation

## 2015-03-27 DIAGNOSIS — Z7982 Long term (current) use of aspirin: Secondary | ICD-10-CM | POA: Insufficient documentation

## 2015-03-27 DIAGNOSIS — E119 Type 2 diabetes mellitus without complications: Secondary | ICD-10-CM | POA: Insufficient documentation

## 2015-03-27 MED ORDER — PENICILLIN V POTASSIUM 250 MG PO TABS
500.0000 mg | ORAL_TABLET | Freq: Once | ORAL | Status: AC
  Administered 2015-03-27: 500 mg via ORAL
  Filled 2015-03-27: qty 2

## 2015-03-27 MED ORDER — OXYCODONE-ACETAMINOPHEN 5-325 MG PO TABS
1.0000 | ORAL_TABLET | Freq: Once | ORAL | Status: AC
  Administered 2015-03-27: 1 via ORAL
  Filled 2015-03-27: qty 1

## 2015-03-27 MED ORDER — HYDROCODONE-ACETAMINOPHEN 5-325 MG PO TABS: 1.0000 | ORAL_TABLET | Freq: Four times a day (QID) | ORAL | Status: DC | PRN

## 2015-03-27 MED ORDER — PENICILLIN V POTASSIUM 500 MG PO TABS: 500.0000 mg | ORAL_TABLET | Freq: Four times a day (QID) | ORAL | Status: DC

## 2015-03-27 MED ORDER — IBUPROFEN 800 MG PO TABS: 800.0000 mg | ORAL_TABLET | Freq: Three times a day (TID) | ORAL | Status: DC | PRN

## 2015-03-27 MED ORDER — IBUPROFEN 200 MG PO TABS
600.0000 mg | ORAL_TABLET | Freq: Once | ORAL | Status: AC
  Administered 2015-03-27: 600 mg via ORAL
  Filled 2015-03-27: qty 3

## 2015-03-27 NOTE — ED Provider Notes (Signed)
CSN: [id number]     Arrival date & time 03/27/15  4098 History   First MD Initiated Contact with Patient 03/27/15 0630     Chief Complaint  Patient presents with  . Facial Swelling     (Consider location/radiation/quality/duration/timing/severity/associated sxs/prior Treatment) HPI Patient presents to the emergency department with swelling and pain to the upper gumline on the left.  The patient states this started 2 days ago, but is not worse.  The patient, states she has been rinsing with warm salt water relief of her symptoms.  The patient states that she has had dental infections in the past.  She states that she is very poor dentition.  She does have a dentist that she follows up with.  Patient denies fever, nausea, vomiting, weakness, dizziness, headache, blurred vision, neck swelling, neck pain, chest pain, shortness of breath or syncope.  The patient states that nothing seems make her condition better, but palpation makes the pain worse Past Medical History  Diagnosis Date  . Hypertension   . Hyperlipidemia   . Myocardial infarction   . Heart murmur   . Stroke   . Diabetes mellitus    Past Surgical History  Procedure Laterality Date  . Abdominal hysterectomy    . Angoiplasty    . Left heart catheterization with coronary angiogram N/A 01/28/2012    Procedure: LEFT HEART CATHETERIZATION WITH CORONARY ANGIOGRAM;  Surgeon: Marykay Lex, MD;  Location: [facility] CATH LAB;  Service: Cardiovascular;  Laterality: N/A;   Family History  Problem Relation Age of Onset  . Diabetes Mother   . Hypertension Mother   . Hypertension Father    History  Substance Use Topics  . Smoking status: Current Every Day Smoker -- 1.00 packs/day for 40 years    Types: Cigarettes  . Smokeless tobacco: Never Used  . Alcohol Use: No   OB History    No data available     Review of Systems All other systems negative except as documented in the HPI. All pertinent positives and negatives as reviewed in  the HPI.   Allergies  Morphine and related  Home Medications   Prior to Admission medications   Medication Sig Start Date End Date Taking? Authorizing Provider  aspirin 325 MG tablet Take 325 mg by mouth daily.      Historical Provider, MD  azithromycin (ZITHROMAX Z-PAK) 250 MG tablet Take taper pack as directed 02/01/14   Renee A Kuneff, DO  benzonatate (TESSALON) 100 MG capsule TAKE ONE CAPSULE BY MOUTH TWICE DAILY AS NEEDED FOR COUGH,SWALLOW WHOLE.DO NOT CHEW 06/26/14   Stephanie Coup Street, MD  clopidogrel (PLAVIX) 75 MG tablet Take 1 tablet (75 mg total) by mouth daily. 10/27/13   Stephanie Coup Street, MD  CRESTOR 20 MG tablet TAKE ONE TABLET BY MOUTH ONCE DAILY 11/06/14   Stephanie Coup Street, MD  isosorbide mononitrate (IMDUR) 30 MG 24 hr tablet TAKE ONE TABLET BY MOUTH ONCE DAILY 03/05/15   Bobbye Morton, MD  KLOR-CON M10 10 MEQ tablet TAKE ONE TABLET BY MOUTH ONCE DAILY 03/05/15   Stephanie Coup Street, MD  lisinopril (PRINIVIL,ZESTRIL) 20 MG tablet Take 1 tablet (20 mg total) by mouth daily. 10/27/13   Stephanie Coup Street, MD  metFORMIN (GLUCOPHAGE-XR) 500 MG 24 hr tablet Take 2 tablets (1,000 mg total) by mouth daily. 10/27/13   Stephanie Coup Street, MD  nitroGLYCERIN (NITROSTAT) 0.4 MG SL tablet Place 0.4 mg under the tongue every 5 (five) minutes as needed. Call 911 if no  improvement after 3rd tab     Historical Provider, MD  potassium chloride (K-DUR) 10 MEQ tablet Take 1 tablet (10 mEq total) by mouth daily. 02/01/14 02/01/15  Renee A Kuneff, DO  rosuvastatin (CRESTOR) 20 MG tablet Take 1 tablet (20 mg total) by mouth daily. 10/27/13   Stephanie Couphristopher M Street, MD  triamterene-hydrochlorothiazide (MAXZIDE) 75-50 MG per tablet Take 1 tablet by mouth daily. 10/27/13 10/27/14  Stephanie Couphristopher M Street, MD  triamterene-hydrochlorothiazide (MAXZIDE) 75-50 MG per tablet TAKE ONE TABLET BY MOUTH ONCE DAILY 08/04/14   Stephanie Couphristopher M Street, MD   BP 190/104 mmHg  Pulse 55  Temp(Src) 99 F (37.2  C) (Oral)  Resp 18  Ht 5\' 4"  (1.626 m)  Wt 165 lb (74.844 kg)  BMI 28.31 kg/m2  SpO2 98% Physical Exam  Constitutional: She is oriented to person, place, and time. She appears well-developed and well-nourished. No distress.  HENT:  Head: Normocephalic and atraumatic.  Mouth/Throat: Uvula is midline, oropharynx is clear and moist and mucous membranes are normal. No trismus in the jaw. Abnormal dentition. Dental abscesses and dental caries present. No uvula swelling.    Eyes: Pupils are equal, round, and reactive to light.  Neck: Normal range of motion. Neck supple.  Cardiovascular: Normal rate, regular rhythm and normal heart sounds.  Exam reveals no gallop and no friction rub.   No murmur heard. Pulmonary/Chest: Effort normal and breath sounds normal. No respiratory distress.  Neurological: She is alert and oriented to person, place, and time.  Skin: Skin is warm and dry. No rash noted. No erythema.  Nursing note and vitals reviewed.   ED Course  Procedures (including critical care time) Patient will be treated for dental abscess.  I have advised to follow up with her dentist for further evaluation and care.  The patient agrees the plan and all questions were answered    Charlestine NightChristopher Caedence Snowden, PA-C 03/27/15 [id number]  Cathren LaineKevin Steinl, MD 03/27/15 [phone]

## 2015-03-27 NOTE — ED Notes (Signed)
Pt had facial swelling for two days on the right side of the mouth, swelling on top of the gums, states broken tooth. Denies fevers.

## 2015-03-27 NOTE — Discharge Instructions (Signed)
Return here as needed.  Follow-up with your dentist, use warm compresses over the area.  Rinse with warm water and peroxide 3 times a day

## 2015-04-12 ENCOUNTER — Other Ambulatory Visit: Payer: Self-pay | Admitting: Family Medicine

## 2015-04-12 DIAGNOSIS — I6789 Other cerebrovascular disease: Secondary | ICD-10-CM

## 2015-04-12 MED ORDER — CLOPIDOGREL BISULFATE 75 MG PO TABS: 75.0000 mg | ORAL_TABLET | Freq: Every day | ORAL | Status: DC

## 2015-04-12 MED ORDER — ISOSORBIDE MONONITRATE ER 30 MG PO TB24: 30.0000 mg | ORAL_TABLET | Freq: Every day | ORAL | Status: DC

## 2015-04-12 MED ORDER — TRIAMTERENE-HCTZ 75-50 MG PO TABS: 1.0000 | ORAL_TABLET | Freq: Every day | ORAL | Status: DC

## 2015-04-12 MED ORDER — POTASSIUM CHLORIDE ER 10 MEQ PO TBCR: 10.0000 meq | EXTENDED_RELEASE_TABLET | Freq: Every day | ORAL | Status: DC

## 2015-04-12 MED ORDER — POTASSIUM CHLORIDE CRYS ER 10 MEQ PO TBCR: 10.0000 meq | EXTENDED_RELEASE_TABLET | Freq: Every day | ORAL | Status: DC

## 2015-04-12 NOTE — Telephone Encounter (Signed)
Pt needs a refill on the following medications:  Isosorbide Klor-con Clopidogrel Triamterene   Please contact pt once complete, thank you, Dorothey Baseman, ASA

## 2015-04-12 NOTE — Telephone Encounter (Signed)
Refilled Rx to last for 1 month. Please have pt come in for an appt (her 3 month follow up should have been May).  Thanks, Joanna Puff, MD [facility] Resident  04/12/2015, 12:57 PM

## 2015-04-17 NOTE — Telephone Encounter (Signed)
Patient informed of message from MD, will call back to schedule an appointment.

## 2015-05-03 ENCOUNTER — Other Ambulatory Visit: Payer: Self-pay | Admitting: Family Medicine

## 2015-05-03 DIAGNOSIS — I10 Essential (primary) hypertension: Secondary | ICD-10-CM

## 2015-05-03 DIAGNOSIS — E119 Type 2 diabetes mellitus without complications: Secondary | ICD-10-CM

## 2015-05-03 MED ORDER — LISINOPRIL 20 MG PO TABS: 20.0000 mg | ORAL_TABLET | Freq: Every day | ORAL | Status: DC

## 2015-05-03 MED ORDER — POTASSIUM CHLORIDE CRYS ER 10 MEQ PO TBCR: 10.0000 meq | EXTENDED_RELEASE_TABLET | Freq: Every day | ORAL | Status: DC

## 2015-05-03 MED ORDER — ROSUVASTATIN CALCIUM 20 MG PO TABS: 20.0000 mg | ORAL_TABLET | Freq: Every day | ORAL | Status: DC

## 2015-05-03 MED ORDER — METFORMIN HCL ER 500 MG PO TB24: 1000.0000 mg | ORAL_TABLET | Freq: Every day | ORAL | Status: DC

## 2015-05-03 MED ORDER — TRIAMTERENE-HCTZ 75-50 MG PO TABS: 1.0000 | ORAL_TABLET | Freq: Every day | ORAL | Status: DC

## 2015-05-03 MED ORDER — ISOSORBIDE MONONITRATE ER 30 MG PO TB24: 30.0000 mg | ORAL_TABLET | Freq: Every day | ORAL | Status: DC

## 2015-05-03 NOTE — Telephone Encounter (Signed)
Refills sent.  Erasmo Downer, MD, MPH PGY-2,  [facility] 05/03/2015 1:20 PM

## 2015-05-03 NOTE — Telephone Encounter (Signed)
Amber Suarez made an appt for 9/15 with primary.  Want to get refills on meds until appt.  Mainly need her blood pressure,potassium,cholesterol and dm medication.  Will get other refills when she come in.

## 2015-05-31 ENCOUNTER — Encounter: Payer: No Typology Code available for payment source | Admitting: Family Medicine

## 2015-05-31 NOTE — Progress Notes (Signed)
This encounter was created in error - please disregard.

## 2015-06-09 ENCOUNTER — Emergency Department (HOSPITAL_COMMUNITY): Payer: No Typology Code available for payment source

## 2015-06-09 ENCOUNTER — Emergency Department (HOSPITAL_COMMUNITY)
Admission: EM | Admit: 2015-06-09 | Discharge: 2015-06-09 | Disposition: A | Discharge status: Home / Self Care | Payer: No Typology Code available for payment source | Attending: Emergency Medicine | Admitting: Emergency Medicine

## 2015-06-09 ENCOUNTER — Encounter (HOSPITAL_COMMUNITY): Payer: Self-pay | Admitting: *Deleted

## 2015-06-09 DIAGNOSIS — I509 Heart failure, unspecified: Secondary | ICD-10-CM | POA: Insufficient documentation

## 2015-06-09 DIAGNOSIS — M79604 Pain in right leg: Secondary | ICD-10-CM | POA: Insufficient documentation

## 2015-06-09 DIAGNOSIS — M25551 Pain in right hip: Secondary | ICD-10-CM | POA: Insufficient documentation

## 2015-06-09 DIAGNOSIS — R011 Cardiac murmur, unspecified: Secondary | ICD-10-CM | POA: Insufficient documentation

## 2015-06-09 DIAGNOSIS — E119 Type 2 diabetes mellitus without complications: Secondary | ICD-10-CM | POA: Insufficient documentation

## 2015-06-09 DIAGNOSIS — Z792 Long term (current) use of antibiotics: Secondary | ICD-10-CM | POA: Insufficient documentation

## 2015-06-09 DIAGNOSIS — Z720 Tobacco use: Secondary | ICD-10-CM | POA: Insufficient documentation

## 2015-06-09 DIAGNOSIS — Z9889 Other specified postprocedural states: Secondary | ICD-10-CM | POA: Insufficient documentation

## 2015-06-09 DIAGNOSIS — I10 Essential (primary) hypertension: Secondary | ICD-10-CM | POA: Insufficient documentation

## 2015-06-09 DIAGNOSIS — Z79899 Other long term (current) drug therapy: Secondary | ICD-10-CM | POA: Insufficient documentation

## 2015-06-09 DIAGNOSIS — Z8673 Personal history of transient ischemic attack (TIA), and cerebral infarction without residual deficits: Secondary | ICD-10-CM | POA: Insufficient documentation

## 2015-06-09 DIAGNOSIS — Z7982 Long term (current) use of aspirin: Secondary | ICD-10-CM | POA: Insufficient documentation

## 2015-06-09 DIAGNOSIS — R52 Pain, unspecified: Secondary | ICD-10-CM

## 2015-06-09 DIAGNOSIS — E785 Hyperlipidemia, unspecified: Secondary | ICD-10-CM | POA: Insufficient documentation

## 2015-06-09 DIAGNOSIS — I252 Old myocardial infarction: Secondary | ICD-10-CM | POA: Insufficient documentation

## 2015-06-09 HISTORY — DX: Heart failure, unspecified: I50.9

## 2015-06-09 MED ORDER — MELOXICAM 7.5 MG PO TABS: 7.5000 mg | ORAL_TABLET | Freq: Every day | ORAL | Status: DC

## 2015-06-09 NOTE — ED Notes (Signed)
Pt states that almost a month ago she was going up a hill and fell and landed on the right side; pt c/o rt leg pain x 3 weeks; pt states that the pain begins in her right hip and radiates down to knee and down shin; no obvious swelling or deformity; pt is able to ambulate but states that it makes the pain worse

## 2015-06-09 NOTE — ED Provider Notes (Signed)
CSN: [id number]     Arrival date & time 06/09/15  0615 History   First MD Initiated Contact with Patient 06/09/15 0703     Chief Complaint  Patient presents with  . Leg Pain     (Consider location/radiation/quality/duration/timing/severity/associated sxs/prior Treatment) HPI Comments: Patient is a 59 year old female who presents today with ongoing right hip pain that radiates to the knee. The pain starts in the mid groin region and then radiates down the lateral leg to the knee. The pain is only present when the patient attempts to stand after sitting for a prolonged period of time and with weightbearing. She denies any trauma directly to the right hip but states approximate one week before the pain started she had a minor fall where she slipped in the grass and landed on her left knee. She denies any back pain, fevers, joint warmth or swelling. She denies any right lower extremity swelling or calf tenderness. She's never had issues with her hip or back in the past. Taking ibuprofen improves her pain. She has not seen anybody for this pain and was thinking it would timely go away on its own. No IV drug abuse, recent hospitalizations or procedures.  Patient is a 59 y.o. female presenting with leg pain. The history is provided by the patient.  Leg Pain Location:  Hip Time since incident:  3 weeks Lower extremity injury: maybe.   Hip location:  R hip Pain details:    Quality:  Aching, sharp and shooting   Radiates to:  R leg   Severity:  Moderate   Onset quality:  Gradual   Duration:  3 weeks   Timing:  Constant   Progression:  Unchanged Chronicity:  New Prior injury to area:  No Relieved by:  NSAIDs Worsened by:  Bearing weight Ineffective treatments:  None tried Associated symptoms: no back pain, no decreased ROM, no fever, no numbness, no swelling and no tingling   Risk factors: no frequent fractures and no recent illness     Past Medical History  Diagnosis Date  . Hypertension     . Hyperlipidemia   . Myocardial infarction   . Heart murmur   . Stroke   . Diabetes mellitus   . CHF (congestive heart failure)    Past Surgical History  Procedure Laterality Date  . Abdominal hysterectomy    . Angoiplasty    . Left heart catheterization with coronary angiogram N/A 01/28/2012    Procedure: LEFT HEART CATHETERIZATION WITH CORONARY ANGIOGRAM;  Surgeon: Marykay Lex, MD;  Location: [facility] CATH LAB;  Service: Cardiovascular;  Laterality: N/A;   Family History  Problem Relation Age of Onset  . Diabetes Mother   . Hypertension Mother   . Hypertension Father    Social History  Substance Use Topics  . Smoking status: Current Every Day Smoker -- 1.00 packs/day for 40 years    Types: Cigarettes  . Smokeless tobacco: Never Used  . Alcohol Use: No   OB History    No data available     Review of Systems  Constitutional: Negative for fever.  Musculoskeletal: Negative for back pain.  All other systems reviewed and are negative.     Allergies  Morphine and related  Home Medications   Prior to Admission medications   Medication Sig Start Date End Date Taking? Authorizing Provider  aspirin 325 MG tablet Take 325 mg by mouth daily.      Historical Provider, MD  azithromycin (ZITHROMAX Z-PAK) 250 MG tablet Take  taper pack as directed 02/01/14   Renee A Kuneff, DO  benzonatate (TESSALON) 100 MG capsule TAKE ONE CAPSULE BY MOUTH TWICE DAILY AS NEEDED FOR COUGH,SWALLOW WHOLE.DO NOT CHEW 06/26/14   Stephanie Coup Street, MD  clopidogrel (PLAVIX) 75 MG tablet Take 1 tablet (75 mg total) by mouth daily. 04/12/15   Joanna Puff, MD  HYDROcodone-acetaminophen (NORCO/VICODIN) 5-325 MG per tablet Take 1 tablet by mouth every 6 (six) hours as needed for moderate pain. 03/27/15   Charlestine Night, PA-C  ibuprofen (ADVIL,MOTRIN) 800 MG tablet Take 1 tablet (800 mg total) by mouth every 8 (eight) hours as needed. 03/27/15   Charlestine Night, PA-C  isosorbide mononitrate (IMDUR)  30 MG 24 hr tablet Take 1 tablet (30 mg total) by mouth daily. 05/03/15   Erasmo Downer, MD  lisinopril (PRINIVIL,ZESTRIL) 20 MG tablet Take 1 tablet (20 mg total) by mouth daily. 05/03/15   Erasmo Downer, MD  metFORMIN (GLUCOPHAGE-XR) 500 MG 24 hr tablet Take 2 tablets (1,000 mg total) by mouth daily. 05/03/15   Erasmo Downer, MD  nitroGLYCERIN (NITROSTAT) 0.4 MG SL tablet Place 0.4 mg under the tongue every 5 (five) minutes as needed. Call 911 if no improvement after 3rd tab     Historical Provider, MD  penicillin v potassium (VEETID) 500 MG tablet Take 1 tablet (500 mg total) by mouth 4 (four) times daily. 03/27/15   Charlestine Night, PA-C  potassium chloride (K-DUR) 10 MEQ tablet Take 1 tablet (10 mEq total) by mouth daily. 04/12/15 04/11/16  Joanna Puff, MD  potassium chloride (KLOR-CON M10) 10 MEQ tablet Take 1 tablet (10 mEq total) by mouth daily. 05/03/15   Erasmo Downer, MD  rosuvastatin (CRESTOR) 20 MG tablet Take 1 tablet (20 mg total) by mouth daily. 10/27/13   Stephanie Coup Street, MD  rosuvastatin (CRESTOR) 20 MG tablet Take 1 tablet (20 mg total) by mouth daily. 05/03/15   Erasmo Downer, MD  triamterene-hydrochlorothiazide (MAXZIDE) 75-50 MG per tablet Take 1 tablet by mouth daily. 10/27/13 10/27/14  Stephanie Coup Street, MD  triamterene-hydrochlorothiazide (MAXZIDE) 75-50 MG per tablet Take 1 tablet by mouth daily. 05/03/15   Erasmo Downer, MD   BP 199/94 mmHg  Pulse 50  Temp(Src) 97.5 F (36.4 C) (Oral)  Resp 16  SpO2 98% Physical Exam  Constitutional: She is oriented to person, place, and time. She appears well-developed and well-nourished. No distress.  HENT:  Head: Normocephalic and atraumatic.  Mouth/Throat: Oropharynx is clear and moist.  Eyes: Conjunctivae and EOM are normal. Pupils are equal, round, and reactive to light.  Neck: Normal range of motion. Neck supple.  Cardiovascular: Normal rate, regular rhythm and intact distal pulses.     Murmur heard. 2/6 systolic murmur heard best at the left sternal border  Pulmonary/Chest: Effort normal and breath sounds normal. No respiratory distress. She has no wheezes. She has no rales.  Musculoskeletal: Normal range of motion. She exhibits no edema or tenderness.       Right hip: She exhibits normal range of motion, normal strength, no tenderness and no bony tenderness.       Lumbar back: Normal.       Legs: Neurological: She is alert and oriented to person, place, and time.  Skin: Skin is warm and dry. No rash noted. No erythema.  Psychiatric: She has a normal mood and affect. Her behavior is normal.  Nursing note and vitals reviewed.   ED Course  Procedures (including critical care time)  Labs Review Labs Reviewed - No data to display  Imaging Review Dg Hip Unilat With Pelvis Min 4 Views Right  06/09/2015   CLINICAL DATA:  Pain  EXAM: DG HIP (WITH OR WITHOUT PELVIS) 4+V RIGHT  COMPARISON:  None.  FINDINGS: No fracture or dislocation is seen.  Right hip joint space is preserved. Mild degenerative changes of the sacroiliac joint.  Visualized bony pelvis appears intact.  Visualized soft tissues are within normal limits.  IMPRESSION: No acute osseus abnormality is seen.   Electronically Signed   By: Charline Bills M.D.   On: 06/09/2015 07:49   I have personally reviewed and evaluated these images and lab results as part of my medical decision-making.   EKG Interpretation None      MDM   Final diagnoses:  Pain    Pt is a 59yo female with 3 week history of right hip pain radiating to the right knee.  Pt is only present with standing and walking.  Started 1week after a minor mechanical fall.  Denies any recent procedures or infectious sx concerning for septic joint.  No swelling or concern for DVT.  Normal sensation and pulse.  Denies any direct injury to that leg and no back pain.    Pt has full ROM and unable to illict pain by passive ROM, internal/external rotation or  distal loading to the foot.  No back pain. Her exam is normal without being able to elicit any acute pain.  Plain imaging of the leg pending.  7:56 AM Imaging is negative. Patient will be prescribed meloxicam and follow-up with orthopedics if symptoms do not improve for further evaluation.  Gwyneth Sprout, MD 06/09/15 [phone]

## 2015-08-06 ENCOUNTER — Encounter: Payer: Self-pay | Admitting: Family Medicine

## 2015-08-06 ENCOUNTER — Ambulatory Visit (INDEPENDENT_AMBULATORY_CARE_PROVIDER_SITE_OTHER): Payer: Managed Care, Other (non HMO) | Admitting: Family Medicine

## 2015-08-06 VITALS — BP 145/90 | Temp 98.2°F | Ht 69.0 in | Wt 164.0 lb

## 2015-08-06 DIAGNOSIS — E139 Other specified diabetes mellitus without complications: Secondary | ICD-10-CM | POA: Diagnosis not present

## 2015-08-06 DIAGNOSIS — I2510 Atherosclerotic heart disease of native coronary artery without angina pectoris: Secondary | ICD-10-CM | POA: Diagnosis not present

## 2015-08-06 DIAGNOSIS — E119 Type 2 diabetes mellitus without complications: Secondary | ICD-10-CM

## 2015-08-06 DIAGNOSIS — Z0000 Encounter for general adult medical examination without abnormal findings: Secondary | ICD-10-CM | POA: Diagnosis not present

## 2015-08-06 DIAGNOSIS — Z7689 Persons encountering health services in other specified circumstances: Secondary | ICD-10-CM

## 2015-08-06 DIAGNOSIS — Z7189 Other specified counseling: Secondary | ICD-10-CM

## 2015-08-06 DIAGNOSIS — I6789 Other cerebrovascular disease: Secondary | ICD-10-CM

## 2015-08-06 DIAGNOSIS — I10 Essential (primary) hypertension: Secondary | ICD-10-CM

## 2015-08-07 LAB — CBC WITH DIFFERENTIAL/PLATELET
Basophils Absolute: 0 10*3/uL (ref 0.0–0.1)
Basophils Relative: 0 % (ref 0–1)
EOS PCT: 2 % (ref 0–5)
Eosinophils Absolute: 0.1 10*3/uL (ref 0.0–0.7)
HEMATOCRIT: 42 % (ref 36.0–46.0)
Hemoglobin: 14.2 g/dL (ref 12.0–15.0)
LYMPHS ABS: 1.6 10*3/uL (ref 0.7–4.0)
LYMPHS PCT: 31 % (ref 12–46)
MCH: 28.3 pg (ref 26.0–34.0)
MCHC: 33.8 g/dL (ref 30.0–36.0)
MCV: 83.7 fL (ref 78.0–100.0)
MONO ABS: 0.6 10*3/uL (ref 0.1–1.0)
MPV: 11.6 fL (ref 8.6–12.4)
Monocytes Relative: 11 % (ref 3–12)
NEUTROS ABS: 2.8 10*3/uL (ref 1.7–7.7)
Neutrophils Relative %: 56 % (ref 43–77)
Platelets: 178 10*3/uL (ref 150–400)
RBC: 5.02 MIL/uL (ref 3.87–5.11)
RDW: 14.9 % (ref 11.5–15.5)
WBC: 5 10*3/uL (ref 4.0–10.5)

## 2015-08-07 LAB — COMPLETE METABOLIC PANEL WITH GFR
ALT: 13 U/L (ref 6–29)
AST: 12 U/L (ref 10–35)
Albumin: 3.7 g/dL (ref 3.6–5.1)
Alkaline Phosphatase: 77 U/L (ref 33–130)
BUN: 10 mg/dL (ref 7–25)
CO2: 29 mmol/L (ref 20–31)
CREATININE: 0.94 mg/dL (ref 0.50–0.99)
Calcium: 9.2 mg/dL (ref 8.6–10.4)
Chloride: 96 mmol/L — ABNORMAL LOW (ref 98–110)
GFR, Est African American: 76 mL/min (ref >=60)
GFR, Est Non African American: 66 mL/min (ref >=60)
Glucose, Bld: 119 mg/dL — ABNORMAL HIGH (ref 65–99)
Potassium: 3.1 mmol/L — ABNORMAL LOW (ref 3.5–5.3)
SODIUM: 137 mmol/L (ref 135–146)
Total Bilirubin: 0.5 mg/dL (ref 0.2–1.2)
Total Protein: 7.2 g/dL (ref 6.1–8.1)

## 2015-08-07 LAB — LIPID PANEL
Cholesterol: 195 mg/dL (ref 125–200)
HDL: 25 mg/dL — AB (ref >=46)
LDL CALC: 127 mg/dL (ref <130)
Total CHOL/HDL Ratio: 7.8 Ratio — ABNORMAL HIGH (ref <=5.0)
Triglycerides: 217 mg/dL — ABNORMAL HIGH (ref <150)
VLDL: 43 mg/dL — ABNORMAL HIGH (ref <30)

## 2015-08-07 LAB — MICROALBUMIN, URINE: Microalb, Ur: 21.7 mg/dL

## 2015-08-07 LAB — HEPATITIS C ANTIBODY: HCV AB: NEGATIVE

## 2015-08-07 LAB — HEMOGLOBIN A1C
Hgb A1c MFr Bld: 7.8 % — ABNORMAL HIGH (ref <5.7)
Mean Plasma Glucose: 177 mg/dL — ABNORMAL HIGH (ref <117)

## 2015-08-07 LAB — TSH: TSH: 2.128 u[IU]/mL (ref 0.350–4.500)

## 2015-08-10 LAB — VITAMIN D 1,25 DIHYDROXY
Vitamin D 1, 25 (OH)2 Total: 42 pg/mL (ref 18–72)
Vitamin D3 1, 25 (OH)2: 42 pg/mL

## 2015-08-12 NOTE — Progress Notes (Signed)
Patient ID: Amber Suarez, female   DOB: 1956/05/14, 59 y.o.   MRN: [id number]   Amber Suarez, is a 59 y.o. female  WUJ:[id number]  [id number]  DOB - 1956-04-15  CC:  Chief Complaint  Patient presents with  . New Patient, establish care    needs PCP and is having knee pain on right side esp in am, chronic issues with insomnia, needs refills on all meds       HPI: Amber Suarez is a 59 y.o. female here to establish care. Patient has a history of diabetes,type II, hypertension, CVA, MI, hypokalemia. She also complains of right knee pain and insomnia. She denies having a stent. She does report having a balloon angioplasty. She has seen a cardiologist (Dr. Clarene Duke) in the past but has not seen him in about 3 years. She has been on Plavix since her MI. She is also on ASA, Isosorbide, lisinopril, metformin, NTG (which she reports not using since her MI), potassium and Maxzide. These dosages of these medications can be found in her medication list. She has been a patient at [facility] and it appears her medications have been most recently refilled by phone through that agency.It is unclear to me whether she is on Plavix because of the MI or a CVA. She reports the CVA was due to an aneursym. Her last refill of Plavix has an associated diagnosis of CVA and was renewed by a Dr. Leonides Schanz at [facility].   Allergies  Allergen Reactions  . Morphine And Related Itching and Other (See Comments)    burning   Past Medical History  Diagnosis Date  . Hypertension   . Hyperlipidemia   . Myocardial infarction (HCC)   . Heart murmur   . Stroke (HCC)   . Diabetes mellitus   . CHF (congestive heart failure) (HCC)    Current Outpatient Prescriptions on File Prior to Visit  Medication Sig Dispense Refill  . aspirin 325 MG tablet Take 325 mg by mouth daily.      . isosorbide mononitrate (IMDUR) 30 MG 24 hr tablet Take 1 tablet (30 mg total) by mouth daily. 30 tablet 0  . lisinopril  (PRINIVIL,ZESTRIL) 20 MG tablet Take 1 tablet (20 mg total) by mouth daily. 30 tablet 3  . meloxicam (MOBIC) 7.5 MG tablet Take 1 tablet (7.5 mg total) by mouth daily. 30 tablet 0  . metFORMIN (GLUCOPHAGE-XR) 500 MG 24 hr tablet Take 2 tablets (1,000 mg total) by mouth daily. 60 tablet 3  . nitroGLYCERIN (NITROSTAT) 0.4 MG SL tablet Place 0.4 mg under the tongue every 5 (five) minutes as needed. Call 911 if no improvement after 3rd tab     . potassium chloride (K-DUR) 10 MEQ tablet Take 1 tablet (10 mEq total) by mouth daily. 30 tablet 0  . triamterene-hydrochlorothiazide (MAXZIDE) 75-50 MG per tablet Take 1 tablet by mouth daily. 30 tablet 0  . azithromycin (ZITHROMAX Z-PAK) 250 MG tablet Take taper pack as directed (Patient not taking: Reported on 08/06/2015) 6 each 0  . benzonatate (TESSALON) 100 MG capsule TAKE ONE CAPSULE BY MOUTH TWICE DAILY AS NEEDED FOR COUGH,SWALLOW WHOLE.DO NOT CHEW (Patient not taking: Reported on 08/06/2015) 30 capsule 1  . clopidogrel (PLAVIX) 75 MG tablet Take 1 tablet (75 mg total) by mouth daily. (Patient not taking: Reported on 08/06/2015) 30 tablet 0  . HYDROcodone-acetaminophen (NORCO/VICODIN) 5-325 MG per tablet Take 1 tablet by mouth every 6 (six) hours as needed for moderate pain. (Patient not  taking: Reported on 08/06/2015) 15 tablet 0  . potassium chloride (KLOR-CON M10) 10 MEQ tablet Take 1 tablet (10 mEq total) by mouth daily. (Patient not taking: Reported on 08/06/2015) 30 tablet 0  . triamterene-hydrochlorothiazide (MAXZIDE) 75-50 MG per tablet Take 1 tablet by mouth daily. 30 tablet 3   No current facility-administered medications on file prior to visit.   Family History  Problem Relation Age of Onset  . Diabetes Mother   . Hypertension Mother   . Hypertension Father   . Hypertension Sister    Social History   Social History  . Marital Status: Divorced    Spouse Name: N/A  . Number of Children: N/A  . Years of Education: N/A   Occupational  History  . Not on file.   Social History Main Topics  . Smoking status: Current Every Day Smoker -- 1.00 packs/day for 40 years    Types: Cigarettes  . Smokeless tobacco: Never Used  . Alcohol Use: No  . Drug Use: No  . Sexual Activity: Not Currently    Birth Control/ Protection: Surgical   Other Topics Concern  . Not on file   Social History Narrative    Review of Systems: Constitutional: Negative for fever, chills, appetite change, weight loss. Positive for   Fatigue. Skin: Negative for rashes or lesions of concern. HENT: Negative for ear pain, ear discharge.nose bleeds Eyes: Negative for pain, discharge, redness, itching and visual disturbance. Neck: Negative for pain, stiffness Respiratory: Negative for cough, shortness of breath,   Cardiovascular: Negative for chest pain, palpitations and leg swelling. Gastrointestinal: Negative for abdominal pain, nausea, vomiting, diarrhea, constipations Genitourinary: Negative for dysuria, urgency, frequency, hematuria,  Musculoskeletal: Negative for back pain, joint  swelling, and gait problem Positive for right knee pain.egative for weakness. Neurological: Negative for dizziness, tremors, seizures, syncope,   light-headedness, numbness and headaches. Positive for insomnia.  Hematological: Negative for easy bruising or bleeding Psychiatric/Behavioral: Negative for depression, anxiety, decreased concentration, confusion GYN: Hysterectomy.   Objective:   Filed Vitals:   08/06/15 1418  BP: 145/90  Temp: 98.2 F (36.8 C)    Physical Exam: Constitutional: Patient appears well-developed and well-nourished. No distress. HENT: Normocephalic, atraumatic, External right and left ear normal. Oropharynx is clear and moist.  Eyes: Conjunctivae and EOM are normal. PERRLA, no scleral icterus. Neck: Normal ROM. Neck supple. No lymphadenopathy, No thyromegaly. CVS: RRR, S1/S2 +, no gallops, no rubs. Systolic murmur present Pulmonary: Effort  and breath sounds normal, no stridor, rhonchi, wheezes, rales.  Abdominal: Soft. Normoactive BS,, no distension, tenderness, rebound or guarding.  Musculoskeletal: Normal range of motion. No edema and no tenderness.  Neuro: Alert.Normal muscle tone coordination. Non-focal Skin: Skin is warm and dry. No rash noted. Not diaphoretic. No erythema. No pallor. Psychiatric: Normal mood and affect. Behavior, judgment, thought content normal.  Lab Results  Component Value Date   WBC 5.0 08/06/2015   HGB 14.2 08/06/2015   HCT 42.0 08/06/2015   MCV 83.7 08/06/2015   PLT 178 08/06/2015   Lab Results  Component Value Date   CREATININE 0.94 08/06/2015   BUN 10 08/06/2015   NA 137 08/06/2015   K 3.1* 08/06/2015   CL 96* 08/06/2015   CO2 29 08/06/2015    Lab Results  Component Value Date   HGBA1C 7.8* 08/06/2015   Lipid Panel     Component Value Date/Time   CHOL 195 08/06/2015 1458   TRIG 217* 08/06/2015 1458   HDL 25* 08/06/2015 1458   CHOLHDL  7.8* 08/06/2015 1458   VLDL 43* 08/06/2015 1458   LDLCALC 127 08/06/2015 1458       Assessment and plan:   1. Diabetes mellitus of other type without complication (HCC)  - COMPLETE METABOLIC PANEL WITH GFR - CBC with Differential - Lipid panel - Microalbumin, urine - Hemoglobin A1c -Foot exam  2. Health care maintenance  - TSH - Vitamin D 1,25 dihydroxy - POC Hemoccult Bld/Stl (3-Cd Home Screen); Future - Hepatitis C Antibody  3. Encounter to establish care -I have reviewed information presented by the patient and available information from the chart.  4. Coronary artery disease involving native coronary artery of native heart without angina pectoris - Will try to refer back to cardiology for follow-up  5. Long term use of anticoagulant -Will determine if she needs to continue Plavix -Have refilled for now.   6. HYPERTENSION, BENIGN ESSENTIAL -Continue medications she is currently on. Refills not needed at this  time.      No Follow-up on file.  The patient was given clear instructions to go to ER or return to medical center if symptoms don't improve, worsen or new problems develop. The patient verbalized understanding.    Henrietta Hoover FNP  08/12/2015, 11:19 AM

## 2015-08-14 ENCOUNTER — Telehealth: Payer: Self-pay

## 2015-08-14 DIAGNOSIS — Z794 Long term (current) use of insulin: Secondary | ICD-10-CM

## 2015-08-14 DIAGNOSIS — E119 Type 2 diabetes mellitus without complications: Secondary | ICD-10-CM

## 2015-08-14 DIAGNOSIS — I10 Essential (primary) hypertension: Secondary | ICD-10-CM

## 2015-08-14 MED ORDER — MELOXICAM 7.5 MG PO TABS: 7.5000 mg | ORAL_TABLET | Freq: Every day | ORAL | Status: DC

## 2015-08-14 MED ORDER — LISINOPRIL 20 MG PO TABS: 20.0000 mg | ORAL_TABLET | Freq: Every day | ORAL | Status: DC

## 2015-08-14 MED ORDER — POTASSIUM CHLORIDE ER 10 MEQ PO TBCR: 10.0000 meq | EXTENDED_RELEASE_TABLET | Freq: Every day | ORAL | Status: DC

## 2015-08-14 MED ORDER — ISOSORBIDE MONONITRATE ER 30 MG PO TB24: 30.0000 mg | ORAL_TABLET | Freq: Every day | ORAL | Status: DC

## 2015-08-14 MED ORDER — TRIAMTERENE-HCTZ 75-50 MG PO TABS: 1.0000 | ORAL_TABLET | Freq: Every day | ORAL | Status: DC

## 2015-08-14 MED ORDER — METFORMIN HCL ER 500 MG PO TB24: 1000.0000 mg | ORAL_TABLET | Freq: Every day | ORAL | Status: DC

## 2015-08-14 NOTE — Telephone Encounter (Signed)
Amber Suarez,  I spoke with patient and advised of lab results. She is not taking any statins, will you send in one as described in result note? Please send to [facility] [address]. Thanks!

## 2015-08-16 ENCOUNTER — Other Ambulatory Visit: Payer: Self-pay | Admitting: Family Medicine

## 2015-08-16 MED ORDER — PRAVASTATIN SODIUM 40 MG PO TABS: 40.0000 mg | ORAL_TABLET | Freq: Every day | ORAL | Status: DC

## 2015-08-16 NOTE — Telephone Encounter (Signed)
Will do!

## 2015-11-06 ENCOUNTER — Other Ambulatory Visit: Payer: Self-pay

## 2015-11-06 DIAGNOSIS — I10 Essential (primary) hypertension: Secondary | ICD-10-CM

## 2015-11-06 DIAGNOSIS — Z794 Long term (current) use of insulin: Secondary | ICD-10-CM

## 2015-11-06 DIAGNOSIS — E119 Type 2 diabetes mellitus without complications: Secondary | ICD-10-CM

## 2015-11-06 MED ORDER — PRAVASTATIN SODIUM 40 MG PO TABS: 40.0000 mg | ORAL_TABLET | Freq: Every day | ORAL | Status: DC

## 2015-11-06 MED ORDER — ISOSORBIDE MONONITRATE ER 30 MG PO TB24: 30.0000 mg | ORAL_TABLET | Freq: Every day | ORAL | Status: DC

## 2015-11-06 MED ORDER — TRIAMTERENE-HCTZ 75-50 MG PO TABS: 1.0000 | ORAL_TABLET | Freq: Every day | ORAL | Status: DC

## 2015-11-06 MED ORDER — LISINOPRIL 20 MG PO TABS: 20.0000 mg | ORAL_TABLET | Freq: Every day | ORAL | Status: DC

## 2015-11-06 MED ORDER — MELOXICAM 7.5 MG PO TABS: 7.5000 mg | ORAL_TABLET | Freq: Every day | ORAL | Status: DC

## 2015-11-06 MED ORDER — ASPIRIN 325 MG PO TABS: 325.0000 mg | ORAL_TABLET | Freq: Every day | ORAL | Status: DC

## 2015-11-06 MED ORDER — METFORMIN HCL ER 500 MG PO TB24: 1000.0000 mg | ORAL_TABLET | Freq: Every day | ORAL | Status: DC

## 2015-11-06 NOTE — Telephone Encounter (Signed)
Refills sent into pharmacy per patients request. Thanks!

## 2015-11-09 ENCOUNTER — Ambulatory Visit: Payer: No Typology Code available for payment source | Admitting: Family Medicine

## 2015-12-06 ENCOUNTER — Ambulatory Visit: Payer: No Typology Code available for payment source | Admitting: Family Medicine

## 2015-12-07 ENCOUNTER — Encounter: Payer: Self-pay | Admitting: Family Medicine

## 2015-12-07 ENCOUNTER — Ambulatory Visit (INDEPENDENT_AMBULATORY_CARE_PROVIDER_SITE_OTHER): Payer: No Typology Code available for payment source | Admitting: Family Medicine

## 2015-12-07 VITALS — BP 155/90 | HR 87 | Temp 98.2°F | Resp 18 | Ht 69.0 in | Wt 164.0 lb

## 2015-12-07 DIAGNOSIS — Z794 Long term (current) use of insulin: Secondary | ICD-10-CM

## 2015-12-07 DIAGNOSIS — Z1239 Encounter for other screening for malignant neoplasm of breast: Secondary | ICD-10-CM

## 2015-12-07 DIAGNOSIS — Z23 Encounter for immunization: Secondary | ICD-10-CM

## 2015-12-07 DIAGNOSIS — I10 Essential (primary) hypertension: Secondary | ICD-10-CM

## 2015-12-07 DIAGNOSIS — E138 Other specified diabetes mellitus with unspecified complications: Secondary | ICD-10-CM

## 2015-12-07 DIAGNOSIS — Z0000 Encounter for general adult medical examination without abnormal findings: Secondary | ICD-10-CM

## 2015-12-07 DIAGNOSIS — E119 Type 2 diabetes mellitus without complications: Secondary | ICD-10-CM

## 2015-12-07 LAB — COMPLETE METABOLIC PANEL WITH GFR
ALT: 11 U/L (ref 6–29)
AST: 11 U/L (ref 10–35)
Albumin: 3.7 g/dL (ref 3.6–5.1)
Alkaline Phosphatase: 69 U/L (ref 33–130)
BUN: 12 mg/dL (ref 7–25)
CHLORIDE: 99 mmol/L (ref 98–110)
CO2: 33 mmol/L — AB (ref 20–31)
Calcium: 9.2 mg/dL (ref 8.6–10.4)
Creat: 1.05 mg/dL — ABNORMAL HIGH (ref 0.50–0.99)
GFR, EST AFRICAN AMERICAN: 66 mL/min (ref >=60)
GFR, EST NON AFRICAN AMERICAN: 57 mL/min — AB (ref >=60)
Glucose, Bld: 161 mg/dL — ABNORMAL HIGH (ref 65–99)
POTASSIUM: 3 mmol/L — AB (ref 3.5–5.3)
Sodium: 142 mmol/L (ref 135–146)
TOTAL PROTEIN: 7.4 g/dL (ref 6.1–8.1)
Total Bilirubin: 0.7 mg/dL (ref 0.2–1.2)

## 2015-12-07 LAB — HIV ANTIBODY (ROUTINE TESTING W REFLEX): HIV 1&2 Ab, 4th Generation: NONREACTIVE

## 2015-12-07 LAB — HEMOGLOBIN A1C
Hgb A1c MFr Bld: 7.9 % — ABNORMAL HIGH (ref <5.7)
Mean Plasma Glucose: 180 mg/dL — ABNORMAL HIGH (ref <117)

## 2015-12-07 MED ORDER — ISOSORBIDE MONONITRATE ER 30 MG PO TB24: ORAL_TABLET | ORAL | Status: DC

## 2015-12-07 MED ORDER — LISINOPRIL 20 MG PO TABS: 20.0000 mg | ORAL_TABLET | Freq: Every day | ORAL | Status: DC

## 2015-12-07 MED ORDER — PRAVASTATIN SODIUM 40 MG PO TABS: 40.0000 mg | ORAL_TABLET | Freq: Every day | ORAL | Status: DC

## 2015-12-07 MED ORDER — POTASSIUM CHLORIDE CRYS ER 10 MEQ PO TBCR: 10.0000 meq | EXTENDED_RELEASE_TABLET | Freq: Every day | ORAL | Status: DC

## 2015-12-07 MED ORDER — METFORMIN HCL ER 500 MG PO TB24: 1000.0000 mg | ORAL_TABLET | Freq: Every day | ORAL | Status: DC

## 2015-12-07 MED ORDER — TRIAMTERENE-HCTZ 75-50 MG PO TABS: 1.0000 | ORAL_TABLET | Freq: Every day | ORAL | Status: DC

## 2015-12-07 NOTE — Patient Instructions (Signed)
Call [phone] to schedule a mammogram Someone will call if you get an appointment to eye doctor We will let you know if you need any changes in your medication.

## 2015-12-07 NOTE — Progress Notes (Signed)
Patient is here for 3 month FU   Patient denies pain at this time.  Patient states she has taken her medications around 7:30am. Patient has not eaten today

## 2015-12-07 NOTE — Progress Notes (Signed)
Patient ID: Amber Suarez, female   DOB: 12-27-1955, 60 y.o.   MRN: [id number]   Levonne Carreras, is a 60 y.o. female  WGN:[id number]  HQI:[id number]  DOB - 22-Jan-1956  CC:  Chief Complaint  Patient presents with  . Follow-up    3 month       HPI: Amber Suarez is a 60 y.o. female here for follow-up on diabetes and hypertension. She has a history of CAD and an angioplasty several years ago. Also history of CHF and a stroke. From a cerebral aneurysm. She has no lingering deficits.  She reports having no cardiac symptoms for several years. She reports taking her medications regularly but not being particularly careful with diet. Does not exercise regularly. She had a foot exam and urine micral at last visit 6 months ago.   Health Maintenance: She is in need of a Tdap, which she will receive today.   Allergies  Allergen Reactions  . Morphine And Related Itching and Other (See Comments)    burning   Past Medical History  Diagnosis Date  . Hypertension   . Hyperlipidemia   . Myocardial infarction (HCC)   . Heart murmur   . Stroke (HCC)   . Diabetes mellitus   . CHF (congestive heart failure) (HCC)    Current Outpatient Prescriptions on File Prior to Visit  Medication Sig Dispense Refill  . aspirin 325 MG tablet Take 1 tablet (325 mg total) by mouth daily. 30 tablet 2  . azithromycin (ZITHROMAX Z-PAK) 250 MG tablet Take taper pack as directed (Patient not taking: Reported on 08/06/2015) 6 each 0  . benzonatate (TESSALON) 100 MG capsule TAKE ONE CAPSULE BY MOUTH TWICE DAILY AS NEEDED FOR COUGH,SWALLOW WHOLE.DO NOT CHEW (Patient not taking: Reported on 08/06/2015) 30 capsule 1  . clopidogrel (PLAVIX) 75 MG tablet Take 1 tablet (75 mg total) by mouth daily. (Patient not taking: Reported on 08/06/2015) 30 tablet 0  . HYDROcodone-acetaminophen (NORCO/VICODIN) 5-325 MG per tablet Take 1 tablet by mouth every 6 (six) hours as needed for moderate pain. (Patient not taking: Reported on  08/06/2015) 15 tablet 0  . isosorbide mononitrate (IMDUR) 30 MG 24 hr tablet Take 1 tablet (30 mg total) by mouth daily. 30 tablet 2  . lisinopril (PRINIVIL,ZESTRIL) 20 MG tablet Take 1 tablet (20 mg total) by mouth daily. 30 tablet 2  . meloxicam (MOBIC) 7.5 MG tablet Take 1 tablet (7.5 mg total) by mouth daily. 30 tablet 2  . metFORMIN (GLUCOPHAGE-XR) 500 MG 24 hr tablet Take 2 tablets (1,000 mg total) by mouth daily. 60 tablet 2  . nitroGLYCERIN (NITROSTAT) 0.4 MG SL tablet Place 0.4 mg under the tongue every 5 (five) minutes as needed. Call 911 if no improvement after 3rd tab     . potassium chloride (K-DUR) 10 MEQ tablet Take 1 tablet (10 mEq total) by mouth daily. 30 tablet 2  . potassium chloride (KLOR-CON M10) 10 MEQ tablet Take 1 tablet (10 mEq total) by mouth daily. (Patient not taking: Reported on 08/06/2015) 30 tablet 0  . pravastatin (PRAVACHOL) 40 MG tablet Take 1 tablet (40 mg total) by mouth daily. 90 tablet 2  . triamterene-hydrochlorothiazide (MAXZIDE) 75-50 MG per tablet Take 1 tablet by mouth daily. 30 tablet 3  . triamterene-hydrochlorothiazide (MAXZIDE) 75-50 MG tablet Take 1 tablet by mouth daily. 30 tablet 2   No current facility-administered medications on file prior to visit.   Family History  Problem Relation Age of Onset  . Diabetes Mother   .  Hypertension Mother   . Hypertension Father   . Hypertension Sister    Social History   Social History  . Marital Status: Divorced    Spouse Name: N/A  . Number of Children: N/A  . Years of Education: N/A   Occupational History  . Not on file.   Social History Main Topics  . Smoking status: Current Every Day Smoker -- 1.00 packs/day for 40 years    Types: Cigarettes  . Smokeless tobacco: Never Used  . Alcohol Use: No  . Drug Use: No  . Sexual Activity: Not Currently    Birth Control/ Protection: Surgical   Other Topics Concern  . Not on file   Social History Narrative    Review of  Systems: Constitutional: Negative for fever, chills, appetite change, weight loss Positive for fatigue  Skin: Negative for rashes or lesions of concern. HENT: Negative for ear pain, ear discharge.nose bleeds Eyes: Negative for pain, discharge, redness, itching and visual disturbance. Neck: Negative for pain, stiffness Respiratory: Negative for cough, shortness of breath,   Cardiovascular: Negative for chest pain, palpitations and leg swelling. Gastrointestinal: Negative for abdominal pain, nausea, vomiting, diarrhea, constipations Genitourinary: Negative for dysuria, urgency, frequency, hematuria,  Musculoskeletal: Negative for back pain, joint  swelling, and gait problem.Negative for weakness.Positive for right knee pain Neurological: Negative for dizziness, tremors, seizures, syncope,   light-headedness, numbness and headaches.  Hematological: Negative for easy bruising or bleeding Psychiatric/Behavioral: Negative for depression, anxiety, decreased concentration, confusion. Positive for insomnia  Objective:   Filed Vitals:   12/07/15 0857  BP: 155/90  Pulse: 87  Temp: 98.2 F (36.8 C)  Resp: 18    Physical Exam: Constitutional: Patient appears well-developed and well-nourished. No distress. HENT: Normocephalic, atraumatic, External right and left ear normal. Oropharynx is clear and moist.  Eyes: Conjunctivae and EOM are normal. PERRLA, no scleral icterus. Neck: Normal ROM. Neck supple. No lymphadenopathy, No thyromegaly. CVS: RRR, S1/S2 +, no murmurs, no gallops, no rubs Pulmonary: Effort and breath sounds normal, no stridor, rhonchi, wheezes, rales.  Abdominal: Soft. Normoactive BS,, no distension, tenderness, rebound or guarding.  Musculoskeletal: Normal range of motion. No edema and no tenderness.  Neuro: Alert.Normal muscle tone coordination. Non-focal Skin: Skin is warm and dry. No rash noted. Not diaphoretic. No erythema. No pallor. Psychiatric: Normal mood and affect.  Behavior, judgment, thought content normal.  Lab Results  Component Value Date   WBC 5.0 08/06/2015   HGB 14.2 08/06/2015   HCT 42.0 08/06/2015   MCV 83.7 08/06/2015   PLT 178 08/06/2015   Lab Results  Component Value Date   CREATININE 0.94 08/06/2015   BUN 10 08/06/2015   NA 137 08/06/2015   K 3.1* 08/06/2015   CL 96* 08/06/2015   CO2 29 08/06/2015    Lab Results  Component Value Date   HGBA1C 7.8* 08/06/2015   Lipid Panel     Component Value Date/Time   CHOL 195 08/06/2015 1458   TRIG 217* 08/06/2015 1458   HDL 25* 08/06/2015 1458   CHOLHDL 7.8* 08/06/2015 1458   VLDL 43* 08/06/2015 1458   LDLCALC 127 08/06/2015 1458       Assessment and plan:   1. Breast cancer screening  - MM DIGITAL SCREENING BILATERAL; Future  2. Diabetes mellitus of other type with complication (HCC)  - COMPLETE METABOLIC PANEL WITH GFR - Hemoglobin A1c - Ambulatory referral to Ophthalmology  3. Health care maintenance  - HIV antibody (with reflex)  4. Need for diphtheria-tetanus-pertussis (Tdap)  vaccine, adult/adolescent  - Tdap vaccine greater than or equal to 7yo IM  5. Insomnia -I have asked her to discuss this with her mental health professional.  Return in about 6 months (around 06/08/2016) for HTN, Diabetes, A1C.  The patient was given clear instructions to go to ER or return to medical center if symptoms don't improve, worsen or new problems develop. The patient verbalized understanding.    Henrietta HooverLinda C Bernhardt FNP  12/07/2015, 9:19 AM

## 2015-12-18 ENCOUNTER — Other Ambulatory Visit: Payer: Self-pay | Admitting: Family Medicine

## 2015-12-18 DIAGNOSIS — Z1231 Encounter for screening mammogram for malignant neoplasm of breast: Secondary | ICD-10-CM

## 2016-01-07 ENCOUNTER — Ambulatory Visit
Admission: RE | Admit: 2016-01-07 | Discharge: 2016-01-07 | Disposition: A | Discharge status: Home / Self Care | Payer: No Typology Code available for payment source | Source: Ambulatory Visit | Attending: Family Medicine | Admitting: Family Medicine

## 2016-01-07 DIAGNOSIS — Z1231 Encounter for screening mammogram for malignant neoplasm of breast: Secondary | ICD-10-CM

## 2016-01-11 ENCOUNTER — Other Ambulatory Visit: Payer: Self-pay | Admitting: Family Medicine

## 2016-01-11 ENCOUNTER — Other Ambulatory Visit: Payer: No Typology Code available for payment source

## 2016-01-11 DIAGNOSIS — E876 Hypokalemia: Secondary | ICD-10-CM

## 2016-01-11 LAB — BASIC METABOLIC PANEL
BUN: 12 mg/dL (ref 7–25)
CALCIUM: 9.1 mg/dL (ref 8.6–10.4)
CO2: 34 mmol/L — AB (ref 20–31)
CREATININE: 1.09 mg/dL — AB (ref 0.50–0.99)
Chloride: 99 mmol/L (ref 98–110)
GLUCOSE: 172 mg/dL — AB (ref 65–99)
Potassium: 2.8 mmol/L — ABNORMAL LOW (ref 3.5–5.3)
Sodium: 141 mmol/L (ref 135–146)

## 2016-06-13 ENCOUNTER — Ambulatory Visit: Payer: No Typology Code available for payment source | Admitting: Family Medicine

## 2016-07-10 ENCOUNTER — Ambulatory Visit: Payer: No Typology Code available for payment source | Admitting: Family Medicine

## 2016-08-15 ENCOUNTER — Encounter: Payer: Self-pay | Admitting: Family Medicine

## 2016-08-15 ENCOUNTER — Ambulatory Visit (INDEPENDENT_AMBULATORY_CARE_PROVIDER_SITE_OTHER): Payer: Self-pay | Admitting: Family Medicine

## 2016-08-15 VITALS — BP 182/90 | HR 64 | Temp 98.1°F | Resp 16 | Ht 69.0 in | Wt 155.0 lb

## 2016-08-15 DIAGNOSIS — Z1211 Encounter for screening for malignant neoplasm of colon: Secondary | ICD-10-CM

## 2016-08-15 DIAGNOSIS — I10 Essential (primary) hypertension: Secondary | ICD-10-CM

## 2016-08-15 DIAGNOSIS — E118 Type 2 diabetes mellitus with unspecified complications: Secondary | ICD-10-CM

## 2016-08-15 LAB — CBC WITH DIFFERENTIAL/PLATELET
BASOS ABS: 43 {cells}/uL (ref 0–200)
Basophils Relative: 1 %
EOS PCT: 2 %
Eosinophils Absolute: 86 cells/uL (ref 15–500)
HCT: 42.4 % (ref 35.0–45.0)
HEMOGLOBIN: 14.1 g/dL (ref 11.7–15.5)
LYMPHS ABS: 1247 {cells}/uL (ref 850–3900)
LYMPHS PCT: 29 %
MCH: 28.3 pg (ref 27.0–33.0)
MCHC: 33.3 g/dL (ref 32.0–36.0)
MCV: 85.1 fL (ref 80.0–100.0)
MPV: 11.3 fL (ref 7.5–12.5)
Monocytes Absolute: 387 cells/uL (ref 200–950)
Monocytes Relative: 9 %
NEUTROS PCT: 59 %
Neutro Abs: 2537 cells/uL (ref 1500–7800)
Platelets: 157 10*3/uL (ref 140–400)
RBC: 4.98 MIL/uL (ref 3.80–5.10)
RDW: 14.8 % (ref 11.0–15.0)
WBC: 4.3 10*3/uL (ref 3.8–10.8)

## 2016-08-15 LAB — COMPLETE METABOLIC PANEL WITH GFR
ALBUMIN: 3.9 g/dL (ref 3.6–5.1)
ALK PHOS: 65 U/L (ref 33–130)
ALT: 8 U/L (ref 6–29)
AST: 13 U/L (ref 10–35)
BUN: 8 mg/dL (ref 7–25)
CALCIUM: 9.1 mg/dL (ref 8.6–10.4)
CHLORIDE: 96 mmol/L — AB (ref 98–110)
CO2: 33 mmol/L — ABNORMAL HIGH (ref 20–31)
Creat: 0.93 mg/dL (ref 0.50–0.99)
GFR, EST NON AFRICAN AMERICAN: 67 mL/min (ref >=60)
GFR, Est African American: 77 mL/min (ref >=60)
GLUCOSE: 115 mg/dL — AB (ref 65–99)
POTASSIUM: 3.4 mmol/L — AB (ref 3.5–5.3)
SODIUM: 140 mmol/L (ref 135–146)
Total Bilirubin: 0.7 mg/dL (ref 0.2–1.2)
Total Protein: 7.4 g/dL (ref 6.1–8.1)

## 2016-08-15 LAB — POCT GLYCOSYLATED HEMOGLOBIN (HGB A1C): HEMOGLOBIN A1C: 7.3

## 2016-08-15 LAB — LIPID PANEL
CHOL/HDL RATIO: 4.7 ratio (ref <5.0)
Cholesterol: 145 mg/dL (ref <200)
HDL: 31 mg/dL — AB (ref >50)
LDL CALC: 95 mg/dL (ref <100)
Triglycerides: 94 mg/dL (ref <150)
VLDL: 19 mg/dL (ref <30)

## 2016-08-15 MED ORDER — GLIMEPIRIDE 2 MG PO TABS: 2.0000 mg | ORAL_TABLET | Freq: Every day | ORAL | 3 refills | Status: DC

## 2016-08-15 NOTE — Progress Notes (Signed)
Amber Suarez, is a 60 y.o. female  ZHY:[id number]:[id number]  EXB:[id number]  DOB - 06/02/1956  CC:  Chief Complaint  Patient presents with  . Hypertension  . Diabetes  . Insomnia       HPI: Amber Suarez is a 60 y.o. female here for follow-up diabetes and hypertension. She has a history of MI, CHF, CVA. She reports not needing refills today. She did not take her BP medication this morning and her BP is 182/90. Her A1C in March was 7.9. She is currently on metformin 500 XL. She is on Imdur 30, lisinopril 20, maxide 75/25. Pravastatin, potassium and ASA. She reports continuing to smoke 1 pack cigarettes daily, does not follow a stric diet and does not exercise regularly. She is not ready to stop smoking. She denies alcohol or drug use.   Health Maintenance: She declines Tdap, influenza and 2nd pneumonia. She need a mammogram and colon cancer screening.   Allergies  Allergen Reactions  . Morphine And Related Itching and Other (See Comments)    burning   Past Medical History:  Diagnosis Date  . CHF (congestive heart failure) (HCC)   . Diabetes mellitus   . Heart murmur   . Hyperlipidemia   . Hypertension   . Myocardial infarction   . Stroke [facility](HCC)    Current Outpatient Prescriptions on File Prior to Visit  Medication Sig Dispense Refill  . aspirin 325 MG tablet Take 1 tablet (325 mg total) by mouth daily. 30 tablet 2  . isosorbide mononitrate (IMDUR) 30 MG 24 hr tablet Take one tablet twice a day 90 tablet 2  . lisinopril (PRINIVIL,ZESTRIL) 20 MG tablet Take 1 tablet (20 mg total) by mouth daily. 180 tablet 2  . metFORMIN (GLUCOPHAGE-XR) 500 MG 24 hr tablet Take 2 tablets (1,000 mg total) by mouth daily. 180 tablet 2  . potassium chloride (KLOR-CON M10) 10 MEQ tablet Take 1 tablet (10 mEq total) by mouth daily. 90 tablet 2  . pravastatin (PRAVACHOL) 40 MG tablet Take 1 tablet (40 mg total) by mouth daily. 90 tablet 2  . triamterene-hydrochlorothiazide (MAXZIDE) 75-50 MG tablet Take 1  tablet by mouth daily. 90 tablet 2  . HYDROcodone-acetaminophen (NORCO/VICODIN) 5-325 MG per tablet Take 1 tablet by mouth every 6 (six) hours as needed for moderate pain. (Patient not taking: Reported on 08/15/2016) 15 tablet 0  . nitroGLYCERIN (NITROSTAT) 0.4 MG SL tablet Place 0.4 mg under the tongue every 5 (five) minutes as needed. Call 911 if no improvement after 3rd tab      No current facility-administered medications on file prior to visit.    Family History  Problem Relation Age of Onset  . Diabetes Mother   . Hypertension Mother   . Hypertension Father   . Hypertension Sister    Social History   Social History  . Marital status: Divorced    Spouse name: N/A  . Number of children: N/A  . Years of education: N/A   Occupational History  . Not on file.   Social History Main Topics  . Smoking status: Current Every Day Smoker    Packs/day: 1.00    Years: 40.00    Types: Cigarettes  . Smokeless tobacco: Never Used  . Alcohol use No  . Drug use: No  . Sexual activity: Not Currently    Birth control/ protection: Surgical   Other Topics Concern  . Not on file   Social History Narrative  . No narrative on file    Review  of Systems: Constitutional: Negative Skin: Negative HENT: Negative  Eyes: Negative  Neck: Negative Respiratory: Negative Cardiovascular: Negative Gastrointestinal: Negative Genitourinary: Negative  Musculoskeletal: Negative   Neurological: Negative for Hematological: Negative  Psychiatric/Behavioral: + for insomnia.    Objective:   Vitals:   08/15/16 0815  BP: (!) 182/90  Pulse: 64  Resp: 16  Temp: 98.1 F (36.7 C)    Physical Exam: Constitutional: Patient appears well-developed and well-nourished. No distress. HENT: Normocephalic, atraumatic, External right and left ear normal. Oropharynx is clear and moist.  Eyes: Conjunctivae and EOM are normal. PERRLA, no scleral icterus. Neck: Normal ROM. Neck supple. No lymphadenopathy, No  thyromegaly. CVS: RRR, S1/S2 + no gallops, no rubs. Faint murmur heard Pulmonary: Effort and breath sounds normal, no stridor, rhonchi, wheezes, rales.  Abdominal: Soft. Normoactive BS,, no distension, tenderness, rebound or guarding.  Musculoskeletal: Normal range of motion. No edema and no tenderness.  Neuro: Alert.Normal muscle tone coordination. Non-focal Skin: Skin is warm and dry. No rash noted. Not diaphoretic. No erythema. No pallor. Psychiatric: Normal mood and affect. Behavior, judgment, thought content normal.  Lab Results  Component Value Date   WBC 5.0 08/06/2015   HGB 14.2 08/06/2015   HCT 42.0 08/06/2015   MCV 83.7 08/06/2015   PLT 178 08/06/2015   Lab Results  Component Value Date   CREATININE 1.09 (H) 01/11/2016   BUN 12 01/11/2016   NA 141 01/11/2016   K 2.8 (L) 01/11/2016   CL 99 01/11/2016   CO2 34 (H) 01/11/2016    Lab Results  Component Value Date   HGBA1C 7.9 (H) 12/07/2015   Lipid Panel     Component Value Date/Time   CHOL 195 08/06/2015 1458   TRIG 217 (H) 08/06/2015 1458   HDL 25 (L) 08/06/2015 1458   CHOLHDL 7.8 (H) 08/06/2015 1458   VLDL 43 (H) 08/06/2015 1458   LDLCALC 127 08/06/2015 1458        Assessment and plan:   1. Type 2 diabetes mellitus with complication, unspecified long term insulin use status (HCC)  - POCT glycosylated hemoglobin (Hb A1C) - CBC with Differential - COMPLETE METABOLIC PANEL WITH GFR - Lipid panel - Microalbumin, urine -Add amaryl 2 mg daily, #30 with 3 refills.  2. HYPERTENSION, BENIGN ESSENTIAL -Continue current medication -Return next week for BP check with nurse.   3. [facility] -Needs eye exam and mammogram. Will let us know when OC is renewed.    Return in about 3 months (around 11/13/2016), or 1 week for BP check with nurse., for HTN, Diabetes, A1C.  The patient was given clear instructions to go to ER or return to medical center if symptoms don't improve, worsen or new problems develop.  The patient verbalized understanding.    Henrietta HooverLinda C Bernhardt FNP  08/15/2016, 8:44 AM

## 2016-08-15 NOTE — Patient Instructions (Signed)
Come back next week for BP check with nurse

## 2016-08-16 LAB — MICROALBUMIN, URINE: MICROALB UR: 39.1 mg/dL

## 2016-08-21 ENCOUNTER — Other Ambulatory Visit (INDEPENDENT_AMBULATORY_CARE_PROVIDER_SITE_OTHER): Payer: Self-pay

## 2016-08-21 DIAGNOSIS — Z1211 Encounter for screening for malignant neoplasm of colon: Secondary | ICD-10-CM

## 2016-08-21 LAB — POC HEMOCCULT BLD/STL (HOME/3-CARD/SCREEN)
CARD #2 DATE: 12052017
CARD #3 DATE: 12062017
Card #1 Date: 12042017
Card #3 Fecal Occult Blood, POC: NEGATIVE
FECAL OCCULT BLD: NEGATIVE
Fecal Occult Blood, POC: NEGATIVE

## 2016-08-25 ENCOUNTER — Other Ambulatory Visit: Payer: Self-pay | Admitting: Family Medicine

## 2016-08-25 ENCOUNTER — Ambulatory Visit (INDEPENDENT_AMBULATORY_CARE_PROVIDER_SITE_OTHER): Payer: Self-pay | Admitting: Family Medicine

## 2016-08-25 VITALS — BP 146/96 | HR 100 | Resp 12

## 2016-08-25 DIAGNOSIS — I10 Essential (primary) hypertension: Secondary | ICD-10-CM

## 2016-08-25 NOTE — Progress Notes (Signed)
Advised patient per Concepcion LivingLinda Bernhardt, NP to pick up new rx and start new medication and return in 1 month for blood pressure check with nurse.

## 2016-08-26 NOTE — Progress Notes (Signed)
Nurse visit only

## 2016-08-28 ENCOUNTER — Encounter (HOSPITAL_BASED_OUTPATIENT_CLINIC_OR_DEPARTMENT_OTHER): Payer: Self-pay | Admitting: *Deleted

## 2016-08-28 ENCOUNTER — Telehealth: Payer: Self-pay

## 2016-08-28 ENCOUNTER — Emergency Department (HOSPITAL_BASED_OUTPATIENT_CLINIC_OR_DEPARTMENT_OTHER)
Admission: EM | Admit: 2016-08-28 | Discharge: 2016-08-28 | Disposition: A | Discharge status: Home / Self Care | Payer: No Typology Code available for payment source | Attending: Emergency Medicine | Admitting: Emergency Medicine

## 2016-08-28 DIAGNOSIS — I509 Heart failure, unspecified: Secondary | ICD-10-CM | POA: Insufficient documentation

## 2016-08-28 DIAGNOSIS — Z7984 Long term (current) use of oral hypoglycemic drugs: Secondary | ICD-10-CM | POA: Insufficient documentation

## 2016-08-28 DIAGNOSIS — F17210 Nicotine dependence, cigarettes, uncomplicated: Secondary | ICD-10-CM | POA: Insufficient documentation

## 2016-08-28 DIAGNOSIS — R040 Epistaxis: Secondary | ICD-10-CM | POA: Insufficient documentation

## 2016-08-28 DIAGNOSIS — I110 Hypertensive heart disease with heart failure: Secondary | ICD-10-CM | POA: Insufficient documentation

## 2016-08-28 DIAGNOSIS — Z7982 Long term (current) use of aspirin: Secondary | ICD-10-CM | POA: Insufficient documentation

## 2016-08-28 DIAGNOSIS — E119 Type 2 diabetes mellitus without complications: Secondary | ICD-10-CM | POA: Insufficient documentation

## 2016-08-28 LAB — COMPREHENSIVE METABOLIC PANEL
ALK PHOS: 55 U/L (ref 38–126)
ALT: 12 U/L — ABNORMAL LOW (ref 14–54)
ANION GAP: 9 (ref 5–15)
AST: 15 U/L (ref 15–41)
Albumin: 3.7 g/dL (ref 3.5–5.0)
BILIRUBIN TOTAL: 0.6 mg/dL (ref 0.3–1.2)
BUN: 18 mg/dL (ref 6–20)
CALCIUM: 8.7 mg/dL — AB (ref 8.9–10.3)
CO2: 31 mmol/L (ref 22–32)
CREATININE: 1.05 mg/dL — AB (ref 0.44–1.00)
Chloride: 96 mmol/L — ABNORMAL LOW (ref 101–111)
GFR, EST NON AFRICAN AMERICAN: 56 mL/min — AB (ref >60)
Glucose, Bld: 162 mg/dL — ABNORMAL HIGH (ref 65–99)
Potassium: 2.9 mmol/L — ABNORMAL LOW (ref 3.5–5.1)
SODIUM: 136 mmol/L (ref 135–145)
TOTAL PROTEIN: 7.4 g/dL (ref 6.5–8.1)

## 2016-08-28 LAB — CBC WITH DIFFERENTIAL/PLATELET
Basophils Absolute: 0 10*3/uL (ref 0.0–0.1)
Basophils Relative: 0 %
EOS ABS: 0.1 10*3/uL (ref 0.0–0.7)
Eosinophils Relative: 1 %
HCT: 36.8 % (ref 36.0–46.0)
HEMOGLOBIN: 12.6 g/dL (ref 12.0–15.0)
LYMPHS ABS: 1.5 10*3/uL (ref 0.7–4.0)
LYMPHS PCT: 21 %
MCH: 28.3 pg (ref 26.0–34.0)
MCHC: 34.2 g/dL (ref 30.0–36.0)
MCV: 82.7 fL (ref 78.0–100.0)
MONOS PCT: 7 %
Monocytes Absolute: 0.5 10*3/uL (ref 0.1–1.0)
NEUTROS PCT: 71 %
Neutro Abs: 5.1 10*3/uL (ref 1.7–7.7)
Platelets: 151 10*3/uL (ref 150–400)
RBC: 4.45 MIL/uL (ref 3.87–5.11)
RDW: 13.1 % (ref 11.5–15.5)
WBC: 7.2 10*3/uL (ref 4.0–10.5)

## 2016-08-28 LAB — APTT: APTT: 48 s — AB (ref 24–36)

## 2016-08-28 LAB — PROTIME-INR
INR: 1.08
Prothrombin Time: 14 seconds (ref 11.4–15.2)

## 2016-08-28 MED ORDER — ACETAMINOPHEN 325 MG PO TABS
650.0000 mg | ORAL_TABLET | Freq: Once | ORAL | Status: AC
  Administered 2016-08-28: 650 mg via ORAL
  Filled 2016-08-28: qty 2

## 2016-08-28 MED ORDER — CEPHALEXIN 500 MG PO CAPS: 500.0000 mg | ORAL_CAPSULE | Freq: Three times a day (TID) | ORAL | 0 refills | Status: DC

## 2016-08-28 MED ORDER — LORAZEPAM 2 MG/ML IJ SOLN
1.0000 mg | Freq: Once | INTRAMUSCULAR | Status: AC
  Administered 2016-08-28: 1 mg via INTRAVENOUS
  Filled 2016-08-28: qty 1

## 2016-08-28 MED ORDER — SILVER NITRATE-POT NITRATE 75-25 % EX MISC
CUTANEOUS | Status: AC
  Filled 2016-08-28: qty 1

## 2016-08-28 MED ORDER — SILVER NITRATE-POT NITRATE 75-25 % EX MISC
1.0000 | Freq: Once | CUTANEOUS | Status: AC
  Administered 2016-08-28: 1 via TOPICAL
  Administered 2016-08-28: 14:00:00 via TOPICAL

## 2016-08-28 MED ORDER — LABETALOL HCL 5 MG/ML IV SOLN
20.0000 mg | Freq: Once | INTRAVENOUS | Status: AC
  Administered 2016-08-28: 20 mg via INTRAVENOUS
  Filled 2016-08-28: qty 4

## 2016-08-28 MED ORDER — LIDOCAINE HCL 2 % EX GEL
CUTANEOUS | Status: AC
  Administered 2016-08-28: 20
  Filled 2016-08-28: qty 20

## 2016-08-28 MED ORDER — OXYMETAZOLINE HCL 0.05 % NA SOLN
1.0000 | Freq: Once | NASAL | Status: AC
Start: 2016-08-28 — End: 2016-08-28
  Administered 2016-08-28: 1 via NASAL
  Filled 2016-08-28: qty 15

## 2016-08-28 MED ORDER — SILVER NITRATE-POT NITRATE 75-25 % EX MISC
1.0000 "application " | Freq: Once | CUTANEOUS | Status: AC
  Administered 2016-08-28: 1 via TOPICAL
  Filled 2016-08-28: qty 1

## 2016-08-28 MED ORDER — LIDOCAINE HCL 2 % IJ SOLN
INTRAMUSCULAR | Status: AC
  Filled 2016-08-28: qty 20

## 2016-08-28 MED ORDER — LIDOCAINE-EPINEPHRINE (PF) 2 %-1:200000 IJ SOLN
INTRAMUSCULAR | Status: AC
  Administered 2016-08-28: 20 mL
  Filled 2016-08-28: qty 20

## 2016-08-28 NOTE — ED Notes (Signed)
ED Provider at bedside. 

## 2016-08-28 NOTE — Discharge Instructions (Addendum)
You have packing in both nostrils.   Your blood pressure is elevated. Take your medicines as prescribed.   Return to ER if you have worse bleeding, swallowing blood.

## 2016-08-28 NOTE — ED Provider Notes (Signed)
Patient presents as a transfer for nosebleed. She is currently packed bilaterally and has had no further bleeding for the last several hours. Blood pressure is much more controlled at this time. Patient has no active bleeding spoke with Dr. Ezzard StandingNewman who recommended that patient follow-up in the office tomorrow. Patient and her family are comfortable with this plan.   Gwyneth SproutWhitney Keyira Mondesir, MD 08/28/16 2212

## 2016-08-28 NOTE — ED Notes (Signed)
[facility], RN

## 2016-08-28 NOTE — ED Notes (Signed)
Patient aware of transfer, confused. At this time. Family at bedside states that it is ok to transfer to [facility].

## 2016-08-28 NOTE — ED Notes (Signed)
MD to the bedside  - bleeding controlled when leaving the room.

## 2016-08-28 NOTE — ED Notes (Signed)
Patient now has bleeding to her nose. MD aware

## 2016-08-28 NOTE — Telephone Encounter (Signed)
Linda, please advise.     

## 2016-08-28 NOTE — ED Provider Notes (Addendum)
Received patient in turnover from Dr. Yao. In brief patient with a uncontrolled nosebleed was packed at bedside. I was called to the patient's bedside asSilverio Suarez the packing had fallen out. Having some continued bleeding. Afrin mixed with lidocaine with epinephrine administered. Direct pressure for 20 minutes and reassess. Patient with continued oozing of blood. Amber Suarez was placed with direct pressure for 20 minutes again continuation of bleeding. Patient was then packed with a  Rhino Rocket. She continued to have oozing and now is having oozing out of the other side.  Another rhino rocket was placed on the other side.  Continues to ooze.  I discussed the case with Dr. Ezzard StandingNewman, in his throat. [facility]  Amber Suarez Kitchen.Epistaxis Management Date/Time: 08/28/2016 4:30 PM Performed by: Amber Suarez, Amber Suarez Authorized by: Amber Suarez, Amber Suarez   Consent:    Consent obtained:  Verbal   Consent given by:  Patient   Risks discussed:  Bleeding, infection, nasal injury and pain   Alternatives discussed:  No treatment Anesthesia (see MAR for exact dosages):    Anesthesia method:  Topical application   Topical anesthetic:  Lidocaine gel and epinephrine Procedure details:    Treatment site:  R anterior and L anterior   Treatment method:  Anterior pack and merocel sponge   Treatment complexity:  Extensive   Treatment episode: recurring   Post-procedure details:    Assessment:  Bleeding decreased   Patient tolerance of procedure:  Tolerated well, no immediate complications        Amber PlanDan Amber Suarez Vanderslice, DO 08/28/16 1854    Amber Planan Amber Mahaffy, DO 08/28/16 2132

## 2016-08-28 NOTE — ED Provider Notes (Signed)
[facility] Provider Note   CSN: [id number] Arrival date & time: 08/28/16  1407     History   Chief Complaint Chief Complaint  Patient presents with  . Epistaxis    HPI [name] is a 60 y.o. female hx of CHF, DM, HL, HTN, stroke on baby ASA, Here presenting with epistaxis. Patient states that she had nosebleed around noon today. She tried to keep some pressure and then it stopped temporarily but then came back. She states that she is only on baby aspirin but denies being on any blood thinners. She states that she and the swallowing some blood and spit up as well.   The history is provided by the patient.    Past Medical History:  Diagnosis Date  . CHF (congestive heart failure) (HCC)   . Diabetes mellitus   . Heart murmur   . Hyperlipidemia   . Hypertension   . Myocardial infarction   . Stroke [facility](HCC)     Patient Active Problem List   Diagnosis Date Noted  . Acute sinus infection 02/01/2014  . Other malaise and fatigue 10/27/2013  . History of MI (myocardial infarction) 10/27/2013  . Unstable angina (HCC) 01/28/2012  . Insomnia 11/26/2010  . DIABETES MELLITUS, TYPE II 05/11/2009  . Overweight(278.02) 06/15/2008  . HYPERTENSION, BENIGN ESSENTIAL 11/20/2006  . HYPERLIPIDEMIA 11/12/2006  . TOBACCO DEPENDENCE 11/12/2006  . DEPRESSIVE DISORDER, NOS 11/12/2006  . CVA 11/12/2006    Past Surgical History:  Procedure Laterality Date  . ABDOMINAL HYSTERECTOMY    . angoiplasty    . LEFT HEART CATHETERIZATION WITH CORONARY ANGIOGRAM N/A 01/28/2012   Procedure: LEFT HEART CATHETERIZATION WITH CORONARY ANGIOGRAM;  Surgeon: Marykay Lexavid W Harding, MD;  [facility];  Service: Cardiovascular;  Laterality: N/A;  . repair of aneursym  1997    OB History    No data available       Home Medications    Prior to Admission medications   Medication Sig Start Date End Date Taking? Authorizing Provider  aspirin 325 MG tablet Take 1 tablet (325 mg total)  by mouth daily. 11/06/15   Henrietta HooverLinda C Bernhardt, NP  glimepiride (AMARYL) 2 MG tablet Take 1 tablet (2 mg total) by mouth daily before breakfast. 08/15/16   Henrietta HooverLinda C Bernhardt, NP  HYDROcodone-acetaminophen (NORCO/VICODIN) 5-325 MG per tablet Take 1 tablet by mouth every 6 (six) hours as needed for moderate pain. Patient not taking: Reported on 08/15/2016 03/27/15   Charlestine Nighthristopher Lawyer, PA-C  isosorbide mononitrate (IMDUR) 30 MG 24 hr tablet Take one tablet twice a day 12/07/15   Henrietta HooverLinda C Bernhardt, NP  lisinopril (PRINIVIL,ZESTRIL) 20 MG tablet Take 1 tablet (20 mg total) by mouth daily. 12/07/15   Henrietta HooverLinda C Bernhardt, NP  metFORMIN (GLUCOPHAGE-XR) 500 MG 24 hr tablet Take 2 tablets (1,000 mg total) by mouth daily. 12/07/15   Henrietta HooverLinda C Bernhardt, NP  nitroGLYCERIN (NITROSTAT) 0.4 MG SL tablet Place 0.4 mg under the tongue every 5 (five) minutes as needed. Call 911 if no improvement after 3rd tab     Historical Provider, MD  potassium chloride (KLOR-CON M10) 10 MEQ tablet Take 1 tablet (10 mEq total) by mouth daily. 12/07/15   Henrietta HooverLinda C Bernhardt, NP  pravastatin (PRAVACHOL) 40 MG tablet Take 1 tablet (40 mg total) by mouth daily. 12/07/15   Henrietta HooverLinda C Bernhardt, NP  triamterene-hydrochlorothiazide (MAXZIDE) 75-50 MG tablet Take 1 tablet by mouth daily. 12/07/15   Henrietta HooverLinda C Bernhardt, NP    Family History Family  History  Problem Relation Age of Onset  . Diabetes Mother   . Hypertension Mother   . Hypertension Father   . Hypertension Sister     Social History Social History  Substance Use Topics  . Smoking status: Current Every Day Smoker    Packs/day: 1.00    Years: 40.00    Types: Cigarettes  . Smokeless tobacco: Never Used  . Alcohol use No     Allergies   Morphine and related   Review of Systems Review of Systems  HENT: Positive for nosebleeds.   All other systems reviewed and are negative.    Physical Exam Updated Vital Signs BP (!) 168/113   Pulse 66   Temp 98.2 F (36.8 C) (Oral)   Resp  16   Ht 5\' 9"  (1.753 m)   Wt 155 lb (70.3 kg)   SpO2 98%   BMI 22.89 kg/m   Physical Exam  Constitutional: She is oriented to person, place, and time.  Slightly uncomfortable   HENT:  Head: Normocephalic.  Right Ear: External ear normal.  Left Ear: External ear normal.  Dry blood R nostril in R Kisselbach triangle. No obvious bleeding L nostril. Dry blood in posterior pharynx but no active bleeding   Eyes: EOM are normal. Pupils are equal, round, and reactive to light.  Neck: Normal range of motion. Neck supple.  Cardiovascular: Normal rate, regular rhythm and normal heart sounds.   Pulmonary/Chest: Effort normal and breath sounds normal. No respiratory distress. She has no wheezes. She has no rales.  Abdominal: Soft. Bowel sounds are normal. She exhibits no distension. There is no tenderness.  Musculoskeletal: Normal range of motion.  Neurological: She is alert and oriented to person, place, and time. No cranial nerve deficit. Coordination normal.  Skin: Skin is warm.  Psychiatric: She has a normal mood and affect.  Nursing note and vitals reviewed.    ED Treatments / Results  Labs (all labs ordered are listed, but only abnormal results are displayed) Labs Reviewed - No data to display  EKG  EKG Interpretation None       Radiology No results found.  Procedures .Epistaxis Management Date/Time: 08/28/2016 2:48 PM Performed by: Charlynne Pander Authorized by: Charlynne Pander   Consent:    Consent obtained:  Verbal   Consent given by:  Patient   Risks discussed:  Bleeding   Alternatives discussed:  No treatment Anesthesia (see MAR for exact dosages):    Anesthesia method:  None Procedure details:    Treatment site:  L anterior and R anterior   Treatment method:  Silver nitrate and nasal balloon   Treatment complexity:  Limited Post-procedure details:    Assessment:  Bleeding decreased   Patient tolerance of procedure:  Tolerated well, no immediate  complications    (including critical care time)  Medications Ordered in ED Medications  silver nitrate applicators 75-25 % applicator (  Given 08/28/16 1422)  oxymetazoline (AFRIN) 0.05 % nasal spray 1 spray (1 spray Each Nare Given 08/28/16 1427)  silver nitrate applicators applicator 1 Stick (1 Stick Topical Given 08/28/16 1428)     Initial Impression / Assessment and Plan / ED Course  I have reviewed the triage vital signs and the nursing notes.  Pertinent labs & imaging results that were available during my care of the patient were reviewed by me and considered in my medical decision making (see chart for details).  Clinical Course    Amber Suarez is a 60  y.o. female here with epistaxis. Patient on baby ASA only. Has dry blood R kisselbach's triangle. Will try afrin and silver nitrate.   3:43 PM Still bleeding after silver nitrate and surgicel. Tried bilateral nasal packing. Signed out to Dr. [name] to reassess in half hour. Anticipate dc home with keflex if bleeding stopped.    Final Clinical Impressions(s) / ED Diagnoses   Final diagnoses:  None    New Prescriptions New Prescriptions   No medications on file     Charlynne Panderavid Hsienta Aizlyn Schifano, MD 08/28/16 1544

## 2016-08-28 NOTE — ED Triage Notes (Signed)
Nosebleed this am. No active bleeding at triage.

## 2016-08-28 NOTE — ED Triage Notes (Signed)
Pt here to see ENT. Please see [facility] notes. Bilateral rhino rockets in place

## 2016-08-28 NOTE — ED Notes (Signed)
Patient still with large amount of bleeding to her nose. Large blood clots coming out of nose - MD at the bedside - new packing placed as the previous packing fell out again.  The patient is complaining of pain. RN at bedside at 1650 to hold pressure to nose x 20 minutes

## 2016-08-29 ENCOUNTER — Encounter (HOSPITAL_COMMUNITY): Payer: Self-pay

## 2016-08-29 ENCOUNTER — Telehealth: Payer: Self-pay | Admitting: *Deleted

## 2016-08-29 ENCOUNTER — Other Ambulatory Visit: Payer: Self-pay | Admitting: Family Medicine

## 2016-08-29 ENCOUNTER — Emergency Department (HOSPITAL_COMMUNITY)
Admission: EM | Admit: 2016-08-29 | Discharge: 2016-08-29 | Disposition: A | Discharge status: Home / Self Care | Payer: No Typology Code available for payment source | Attending: Emergency Medicine | Admitting: Emergency Medicine

## 2016-08-29 DIAGNOSIS — F17210 Nicotine dependence, cigarettes, uncomplicated: Secondary | ICD-10-CM | POA: Insufficient documentation

## 2016-08-29 DIAGNOSIS — Z79899 Other long term (current) drug therapy: Secondary | ICD-10-CM | POA: Insufficient documentation

## 2016-08-29 DIAGNOSIS — I252 Old myocardial infarction: Secondary | ICD-10-CM | POA: Insufficient documentation

## 2016-08-29 DIAGNOSIS — I10 Essential (primary) hypertension: Secondary | ICD-10-CM

## 2016-08-29 DIAGNOSIS — Z7982 Long term (current) use of aspirin: Secondary | ICD-10-CM | POA: Insufficient documentation

## 2016-08-29 DIAGNOSIS — I509 Heart failure, unspecified: Secondary | ICD-10-CM | POA: Insufficient documentation

## 2016-08-29 DIAGNOSIS — R51 Headache: Secondary | ICD-10-CM | POA: Insufficient documentation

## 2016-08-29 DIAGNOSIS — E119 Type 2 diabetes mellitus without complications: Secondary | ICD-10-CM | POA: Insufficient documentation

## 2016-08-29 DIAGNOSIS — R112 Nausea with vomiting, unspecified: Secondary | ICD-10-CM | POA: Insufficient documentation

## 2016-08-29 DIAGNOSIS — R519 Headache, unspecified: Secondary | ICD-10-CM

## 2016-08-29 DIAGNOSIS — E876 Hypokalemia: Secondary | ICD-10-CM | POA: Insufficient documentation

## 2016-08-29 DIAGNOSIS — I110 Hypertensive heart disease with heart failure: Secondary | ICD-10-CM | POA: Insufficient documentation

## 2016-08-29 DIAGNOSIS — Z8673 Personal history of transient ischemic attack (TIA), and cerebral infarction without residual deficits: Secondary | ICD-10-CM | POA: Insufficient documentation

## 2016-08-29 DIAGNOSIS — Z7984 Long term (current) use of oral hypoglycemic drugs: Secondary | ICD-10-CM | POA: Insufficient documentation

## 2016-08-29 LAB — BASIC METABOLIC PANEL
ANION GAP: 13 (ref 5–15)
BUN: 27 mg/dL — ABNORMAL HIGH (ref 6–20)
CALCIUM: 9.4 mg/dL (ref 8.9–10.3)
CO2: 30 mmol/L (ref 22–32)
Chloride: 97 mmol/L — ABNORMAL LOW (ref 101–111)
Creatinine, Ser: 0.96 mg/dL (ref 0.44–1.00)
GLUCOSE: 144 mg/dL — AB (ref 65–99)
Potassium: 2.6 mmol/L — CL (ref 3.5–5.1)
SODIUM: 140 mmol/L (ref 135–145)

## 2016-08-29 LAB — CBC
HCT: 35.1 % — ABNORMAL LOW (ref 36.0–46.0)
Hemoglobin: 12.4 g/dL (ref 12.0–15.0)
MCH: 28.4 pg (ref 26.0–34.0)
MCHC: 35.3 g/dL (ref 30.0–36.0)
MCV: 80.5 fL (ref 78.0–100.0)
PLATELETS: 160 10*3/uL (ref 150–400)
RBC: 4.36 MIL/uL (ref 3.87–5.11)
RDW: 13 % (ref 11.5–15.5)
WBC: 9.5 10*3/uL (ref 4.0–10.5)

## 2016-08-29 MED ORDER — METOPROLOL SUCCINATE ER 25 MG PO TB24: 25.0000 mg | ORAL_TABLET | Freq: Every day | ORAL | 3 refills | Status: DC

## 2016-08-29 MED ORDER — ONDANSETRON 4 MG PO TBDP: 4.0000 mg | ORAL_TABLET | Freq: Three times a day (TID) | ORAL | 0 refills | Status: DC | PRN

## 2016-08-29 MED ORDER — LABETALOL HCL 5 MG/ML IV SOLN
10.0000 mg | Freq: Once | INTRAVENOUS | Status: AC
  Administered 2016-08-29: 10 mg via INTRAVENOUS
  Filled 2016-08-29: qty 4

## 2016-08-29 MED ORDER — POTASSIUM CHLORIDE CRYS ER 20 MEQ PO TBCR: 20.0000 meq | EXTENDED_RELEASE_TABLET | Freq: Every day | ORAL | 0 refills | Status: DC

## 2016-08-29 MED ORDER — PROCHLORPERAZINE EDISYLATE 5 MG/ML IJ SOLN
10.0000 mg | Freq: Once | INTRAMUSCULAR | Status: AC
  Administered 2016-08-29: 10 mg via INTRAVENOUS
  Filled 2016-08-29: qty 2

## 2016-08-29 MED ORDER — SODIUM CHLORIDE 0.9 % IV BOLUS (SEPSIS)
500.0000 mL | Freq: Once | INTRAVENOUS | Status: AC
  Administered 2016-08-29: 500 mL via INTRAVENOUS

## 2016-08-29 MED ORDER — LABETALOL HCL 5 MG/ML IV SOLN: 10.0000 mg | Freq: Once | INTRAVENOUS | Status: DC

## 2016-08-29 MED ORDER — ISOSORBIDE MONONITRATE ER 30 MG PO TB24
30.0000 mg | ORAL_TABLET | Freq: Once | ORAL | Status: AC
  Administered 2016-08-29: 30 mg via ORAL
  Filled 2016-08-29: qty 1

## 2016-08-29 MED ORDER — SODIUM CHLORIDE 0.9 % IV SOLN
30.0000 meq | Freq: Once | INTRAVENOUS | Status: AC
  Administered 2016-08-29: 30 meq via INTRAVENOUS
  Filled 2016-08-29: qty 15

## 2016-08-29 MED ORDER — POTASSIUM CHLORIDE CRYS ER 20 MEQ PO TBCR
40.0000 meq | EXTENDED_RELEASE_TABLET | Freq: Once | ORAL | Status: AC
  Administered 2016-08-29: 40 meq via ORAL
  Filled 2016-08-29: qty 2

## 2016-08-29 NOTE — ED Triage Notes (Signed)
Pt presents for evaluation of HTN/headache/emesis today. Pt. Seen yesterday for epistaxis, has bilateral rhino rockets. Pt. Daughter states she has been monitoring BP all day and diastolic has been over 100 all day. 186/126 on assessment. Pt. States she believes L nare is beginning to bleed again. Pt. Daughter states 2 episodes of emesis PTA.

## 2016-08-29 NOTE — Telephone Encounter (Signed)
Patient verified DOB Patient instructed to continue with current medication. Return to ED for nose bleed. MA informed patient of message being routed to PCP for review of additional BP medication. No further questions at this time.

## 2016-08-29 NOTE — ED Notes (Signed)
Family at bedside. 

## 2016-08-29 NOTE — ED Notes (Signed)
ED Provider at bedside. 

## 2016-08-30 NOTE — ED Provider Notes (Signed)
[facility]-EMERGENCY DEPT Provider Note   CSN: [id number] Arrival date & time: 08/29/16  1431     History   Chief Complaint Chief Complaint  Patient presents with  . Hypertension  . Emesis    HPI Amber Suarez is a 60 y.o. female.  The history is provided by the patient and a relative.  Emesis   This is a new problem. The emesis has an appearance of stomach contents. There has been no fever. Associated symptoms include headaches. Pertinent negatives include no abdominal pain, no chills, no diarrhea and no fever.  Headache   This is a new problem. The current episode started yesterday. The problem occurs constantly. The problem has been resolved. The headache is associated with nothing. The pain is located in the frontal region. The quality of the pain is described as dull. Pain scale: was a 10/10 now 0/10. The pain does not radiate. Associated symptoms include nausea and vomiting. Pertinent negatives include no fever, no near-syncope, no palpitations and no shortness of breath.  -Was seen Yesterday for bilateral severe epistaxis and had 2 Rhino Rocket placed. Patient has had frontal headaches since having the Rhino Rocket's placed and had 2 episodes of emesis with blood-tinged mucus at 3 AM and one at 8:30 AM.  Past Medical History:  Diagnosis Date  . CHF (congestive heart failure) (HCC)   . Diabetes mellitus   . Heart murmur   . Hyperlipidemia   . Hypertension   . Myocardial infarction   . Stroke [facility](HCC)     Patient Active Problem List   Diagnosis Date Noted  . Acute sinus infection 02/01/2014  . Other malaise and fatigue 10/27/2013  . History of MI (myocardial infarction) 10/27/2013  . Unstable angina (HCC) 01/28/2012  . Insomnia 11/26/2010  . DIABETES MELLITUS, TYPE II 05/11/2009  . Overweight(278.02) 06/15/2008  . HYPERTENSION, BENIGN ESSENTIAL 11/20/2006  . HYPERLIPIDEMIA 11/12/2006  . TOBACCO DEPENDENCE 11/12/2006  . DEPRESSIVE DISORDER, NOS 11/12/2006  . CVA  11/12/2006    Past Surgical History:  Procedure Laterality Date  . ABDOMINAL HYSTERECTOMY    . angoiplasty    . LEFT HEART CATHETERIZATION WITH CORONARY ANGIOGRAM N/A 01/28/2012   Procedure: LEFT HEART CATHETERIZATION WITH CORONARY ANGIOGRAM;  Surgeon: Marykay Lexavid W Harding, MD;  [facility];  Service: Cardiovascular;  Laterality: N/A;  . repair of aneursym  1997    OB History    No data available       Home Medications    Prior to Admission medications   Medication Sig Start Date End Date Taking? Authorizing Provider  aspirin 325 MG tablet Take 1 tablet (325 mg total) by mouth daily. 11/06/15  Yes Henrietta HooverLinda C Bernhardt, NP  glimepiride (AMARYL) 2 MG tablet Take 1 tablet (2 mg total) by mouth daily before breakfast. 08/15/16  Yes Henrietta HooverLinda C Bernhardt, NP  isosorbide mononitrate (IMDUR) 30 MG 24 hr tablet Take one tablet twice a day Patient taking differently: Take 30 mg by mouth 2 (two) times daily.  12/07/15  Yes Henrietta HooverLinda C Bernhardt, NP  lisinopril (PRINIVIL,ZESTRIL) 20 MG tablet Take 1 tablet (20 mg total) by mouth daily. 12/07/15  Yes Henrietta HooverLinda C Bernhardt, NP  metFORMIN (GLUCOPHAGE-XR) 500 MG 24 hr tablet Take 2 tablets (1,000 mg total) by mouth daily. 12/07/15  Yes Henrietta HooverLinda C Bernhardt, NP  potassium chloride (KLOR-CON M10) 10 MEQ tablet Take 1 tablet (10 mEq total) by mouth daily. 12/07/15  Yes Henrietta HooverLinda C Bernhardt, NP  pravastatin (PRAVACHOL) 40 MG tablet Take 1  tablet (40 mg total) by mouth daily. 12/07/15  Yes Henrietta HooverLinda C Bernhardt, NP  triamterene-hydrochlorothiazide (MAXZIDE) 75-50 MG tablet Take 1 tablet by mouth daily. 12/07/15  Yes Henrietta HooverLinda C Bernhardt, NP  cephALEXin (KEFLEX) 500 MG capsule Take 1 capsule (500 mg total) by mouth 3 (three) times daily. Patient not taking: Reported on 08/29/2016 08/28/16   Charlynne Panderavid Hsienta Yao, MD  metoprolol succinate (TOPROL-XL) 25 MG 24 hr tablet Take 1 tablet (25 mg total) by mouth daily. Patient not taking: Reported on 08/29/2016 08/29/16   Henrietta HooverLinda C Bernhardt, NP    nitroGLYCERIN (NITROSTAT) 0.4 MG SL tablet Place 0.4 mg under the tongue every 5 (five) minutes as needed. Call 911 if no improvement after 3rd tab     Historical Provider, MD  ondansetron (ZOFRAN ODT) 4 MG disintegrating tablet Take 1 tablet (4 mg total) by mouth every 8 (eight) hours as needed for nausea or vomiting. 08/29/16   Dwana Melenaobin Arles Rumbold, DO  potassium chloride SA (K-DUR,KLOR-CON) 20 MEQ tablet Take 1 tablet (20 mEq total) by mouth daily. 08/29/16 09/01/16  Dwana Melenaobin Arnice Vanepps, DO    Family History Family History  Problem Relation Age of Onset  . Diabetes Mother   . Hypertension Mother   . Hypertension Father   . Hypertension Sister     Social History Social History  Substance Use Topics  . Smoking status: Current Every Day Smoker    Packs/day: 1.00    Years: 40.00    Types: Cigarettes  . Smokeless tobacco: Never Used  . Alcohol use No     Allergies   Morphine and related   Review of Systems Review of Systems  Constitutional: Negative for chills and fever.  Eyes: Negative for pain and visual disturbance.  Respiratory: Negative for shortness of breath.   Cardiovascular: Negative for palpitations and near-syncope.  Gastrointestinal: Positive for nausea and vomiting. Negative for abdominal pain and diarrhea.  Genitourinary: Negative for dysuria, flank pain and urgency.  Neurological: Positive for headaches. Negative for dizziness, weakness and numbness.  All other systems reviewed and are negative.    Physical Exam Updated Vital Signs BP (!) 177/121   Pulse 84   Temp 98 F (36.7 C) (Oral)   Resp 13   SpO2 100%   Physical Exam  Constitutional: She is oriented to person, place, and time. She appears well-developed and well-nourished. No distress.  HENT:  Head: Normocephalic and atraumatic.  Mouth/Throat: Oropharynx is clear and moist.  Eyes: Conjunctivae and EOM are normal. Pupils are equal, round, and reactive to light.  Neck: Neck supple.  Cardiovascular: Normal  rate and regular rhythm.   No murmur heard. Pulmonary/Chest: Effort normal and breath sounds normal. No respiratory distress.  Abdominal: Soft. There is no tenderness.  Musculoskeletal: She exhibits no edema.  Neurological: She is alert and oriented to person, place, and time. She has normal strength. No cranial nerve deficit or sensory deficit. She displays a negative Romberg sign. GCS eye subscore is 4. GCS verbal subscore is 5. GCS motor subscore is 6.  Skin: Skin is warm and dry.  Psychiatric: She has a normal mood and affect.  Nursing note and vitals reviewed.    ED Treatments / Results  Labs (all labs ordered are listed, but only abnormal results are displayed) Labs Reviewed  CBC - Abnormal; Notable for the following:       Result Value   HCT 35.1 (*)    All other components within normal limits  BASIC METABOLIC PANEL - Abnormal; Notable for  the following:    Potassium 2.6 (*)    Chloride 97 (*)    Glucose, Bld 144 (*)    BUN 27 (*)    All other components within normal limits    EKG  EKG Interpretation None       Radiology No results found.  Procedures Procedures (including critical care time)  Medications Ordered in ED Medications  prochlorperazine (COMPAZINE) injection 10 mg (10 mg Intravenous Given 08/29/16 1652)  sodium chloride 0.9 % bolus 500 mL (0 mLs Intravenous Stopped 08/29/16 1806)  potassium chloride 30 mEq in sodium chloride 0.9 % 265 mL (KCL MULTIRUN) IVPB (30 mEq Intravenous Given 08/29/16 1808)  potassium chloride SA (K-DUR,KLOR-CON) CR tablet 40 mEq (40 mEq Oral Given 08/29/16 1807)  labetalol (NORMODYNE,TRANDATE) injection 10 mg (10 mg Intravenous Given 08/29/16 1900)  labetalol (NORMODYNE,TRANDATE) injection 10 mg (10 mg Intravenous Given 08/29/16 2126)  isosorbide mononitrate (IMDUR) 24 hr tablet 30 mg (30 mg Oral Given 08/29/16 2126)     Initial Impression / Assessment and Plan / ED Course  I have reviewed the triage vital signs and  the nursing notes.  Pertinent labs & imaging results that were available during my care of the patient were reviewed by me and considered in my medical decision making (see chart for details).  Clinical Course    Patient is a 60 year old female with history of CHF, diabetes, hyperlipidemia, hypertension, MI, stroke who presents with one-day of frontal headache and 2 episodes of emesis this morning. Patient states her headache and nausea has improved throughout the day and has resolved. Daughter brought the patient in because she was persistently hypertensive with systolics above 180 throughout the day. Patient states she took all her blood pressure medicines this morning.  Patient was here yesterday and had 2 rhino rockets placed for severe nosebleeds. Patient is supposed to follow up with ENT on Monday. Ever since the [employer] replaced the frontal headaches which is consistent with having 2 large foreign bodies in her nares. The nausea and vomiting episodes is consistent with blood draining into the stomach causing stomach irritation and nausea. She did notice blood-tinged stomach contents with her vomiting. Based on presentation doubt hypertensive emergency, intracranial bleed, migraine. Will hold off on any head imaging at this time.  Patient's systolic blood pressure was between 180 and 200 during ED stay. His yesterday when she was here her blood pressure improved to normal range without any intervention. She is monitored for a while to see if her blood pressure improved spontaneously with a migraine cocktail to help control her symptoms however her high blood pressure persisted. Patient given 2 doses of labetalol IV in the ED as well as 1 dose of Imdur which is on her med list. Blood pressure improved to below 180 systolic. Patient also hypokalemic with a potassium of 2.6 likely secondary to her vomiting. Patient given 30 mEq of IV potassium in the ED. Patient will go home with 3 days of oral  potassium.  Patient is discharged in good condition and will touch base with her PCP on Monday regarding her antihypertensive regimen. Patient was prescribed metoprolol recently however has not picked this up. Patient is to take all her prescribed antihypertensives. Patient also sent home with prescription for Zofran to help with her nausea.   Pt seen with Dr. Preston Fleeting.    Final Clinical Impressions(s) / ED Diagnoses   Final diagnoses:  Frontal headache  Nausea and vomiting, intractability of vomiting not specified, unspecified vomiting type  Hypokalemia    New Prescriptions Discharge Medication List as of 08/29/2016 11:11 PM    START taking these medications   Details  metoprolol succinate (TOPROL-XL) 25 MG 24 hr tablet Take 1 tablet (25 mg total) by mouth daily., Starting Fri 08/29/2016, Normal         Dwana Melena, DO 08/30/16 0105    Dione Booze, MD 08/31/16 [phone]

## 2016-09-01 ENCOUNTER — Telehealth: Payer: Self-pay | Admitting: Family Medicine

## 2016-09-01 NOTE — Telephone Encounter (Signed)
Patient called inquiring if HTN rx had been sent to pharmacy. I told patient per [facility] Concepcion LivingLinda Bernhardt, NP had sent Toprol [facility] Friday and to let me know if it is not available for her to pick up today.

## 2016-09-05 ENCOUNTER — Encounter: Payer: Self-pay | Admitting: Family Medicine

## 2016-09-05 ENCOUNTER — Ambulatory Visit (INDEPENDENT_AMBULATORY_CARE_PROVIDER_SITE_OTHER): Payer: Self-pay | Admitting: Family Medicine

## 2016-09-05 VITALS — BP 123/73 | HR 81 | Temp 98.1°F | Resp 18 | Ht 69.0 in | Wt 148.0 lb

## 2016-09-05 DIAGNOSIS — I10 Essential (primary) hypertension: Secondary | ICD-10-CM

## 2016-09-05 DIAGNOSIS — E119 Type 2 diabetes mellitus without complications: Secondary | ICD-10-CM

## 2016-09-05 DIAGNOSIS — F17200 Nicotine dependence, unspecified, uncomplicated: Secondary | ICD-10-CM

## 2016-09-05 DIAGNOSIS — R42 Dizziness and giddiness: Secondary | ICD-10-CM

## 2016-09-05 DIAGNOSIS — E876 Hypokalemia: Secondary | ICD-10-CM

## 2016-09-05 LAB — BASIC METABOLIC PANEL
BUN: 25 mg/dL (ref 7–25)
CHLORIDE: 98 mmol/L (ref 98–110)
CO2: 32 mmol/L — AB (ref 20–31)
CREATININE: 1.51 mg/dL — AB (ref 0.50–0.99)
Calcium: 8.6 mg/dL (ref 8.6–10.4)
Glucose, Bld: 159 mg/dL — ABNORMAL HIGH (ref 65–99)
Potassium: 2.9 mmol/L — ABNORMAL LOW (ref 3.5–5.3)
Sodium: 141 mmol/L (ref 135–146)

## 2016-09-05 LAB — POCT GLYCOSYLATED HEMOGLOBIN (HGB A1C): Hemoglobin A1C: 7.2

## 2016-09-05 MED ORDER — LISINOPRIL 10 MG PO TABS: 10.0000 mg | ORAL_TABLET | Freq: Every day | ORAL | 0 refills | Status: DC

## 2016-09-05 MED ORDER — TRIAMTERENE-HCTZ 75-50 MG PO TABS: 0.5000 | ORAL_TABLET | Freq: Every day | ORAL | 2 refills | Status: DC

## 2016-09-05 NOTE — Progress Notes (Addendum)
Subjective:    Patient ID: Amber Suarez, female    DOB: 02/02/1956, 60 y.o.   MRN: [id number]  Dizziness  This is a new problem. The current episode started 1 to 4 weeks ago. The problem occurs intermittently. The problem has been gradually worsening. Associated symptoms include vertigo. Pertinent negatives include no abdominal pain, anorexia, arthralgias, change in bowel habit, chest pain, chills, congestion, coughing, diaphoresis, neck pain, sore throat, swollen glands, urinary symptoms, visual change or weakness. Treatments tried: Patient held all blood pressure medications, symptoms improved slightly.  Hypertension  This is a chronic problem. The problem is uncontrolled. Pertinent negatives include no anxiety, blurred vision, chest pain, malaise/fatigue, neck pain, orthopnea, palpitations, peripheral edema, PND or shortness of breath. Risk factors for coronary artery disease include diabetes mellitus, dyslipidemia, post-menopausal state, sedentary lifestyle and smoking/tobacco exposure. Past treatments include ACE inhibitors, beta blockers, calcium channel blockers and diuretics. Compliance problems include diet.  There is no history of renovascular disease or a thyroid problem. There is no history of chronic renal disease, coarctation of the aorta, hyperaldosteronism, hypercortisolism, hyperparathyroidism, a hypertension causing med, pheochromocytoma or sleep apnea.    Past Medical History:  Diagnosis Date  . CHF (congestive heart failure) (HCC)   . Diabetes mellitus   . Heart murmur   . Hyperlipidemia   . Hypertension   . Myocardial infarction   . Stroke [facility](HCC)    Immunization History  Administered Date(s) Administered  . Influenza Split 06/26/2011, 07/14/2012  . Influenza Whole 06/15/2008  . Pneumococcal Polysaccharide-23 10/27/2013  . Td 01/18/2004   Allergies  Allergen Reactions  . Morphine And Related Itching and Other (See Comments)    burning    Review of Systems   Constitutional: Negative.  Negative for chills, diaphoresis and malaise/fatigue.  HENT: Negative.  Negative for congestion and sore throat.   Eyes: Negative.  Negative for blurred vision.  Respiratory: Negative.  Negative for cough and shortness of breath.   Cardiovascular: Negative.  Negative for chest pain, palpitations, orthopnea and PND.  Gastrointestinal: Negative.  Negative for abdominal pain, anorexia and change in bowel habit.  Endocrine: Negative.  Negative for polydipsia, polyphagia and polyuria.  Genitourinary: Negative.   Musculoskeletal: Negative.  Negative for arthralgias and neck pain.  Skin: Negative.   Allergic/Immunologic: Negative.   Neurological: Positive for dizziness and vertigo. Negative for weakness.  Hematological: Negative.   Psychiatric/Behavioral: Negative.        Objective:   Physical Exam  Constitutional: She is oriented to person, place, and time. She appears well-developed and well-nourished.  HENT:  Head: Normocephalic and atraumatic.  Right Ear: External ear normal.  Left Ear: External ear normal.  Nose: Nose normal.  Mouth/Throat: Oropharynx is clear and moist.  Eyes: Conjunctivae and EOM are normal. Pupils are equal, round, and reactive to light.  Neck: Normal range of motion. Neck supple.  Cardiovascular: Normal rate, regular rhythm, normal heart sounds and intact distal pulses.   Pulmonary/Chest: Effort normal and breath sounds normal.  Abdominal: Soft. Bowel sounds are normal.  Musculoskeletal: Normal range of motion.  Neurological: She is alert and oriented to person, place, and time. She has normal reflexes.  Dizziness not reproducible on physical exam  Skin: Skin is warm and dry.  Psychiatric: She has a normal mood and affect. Her behavior is normal. Judgment and thought content normal.      BP 131/79 (BP Location: Right Arm, Patient Position: Sitting, Cuff Size: Normal)   Pulse 85   Temp 98.1 F (  36.7 C) (Oral)   Resp 18   Ht 5'  9" (1.753 m)   Wt 148 lb (67.1 kg)   SpO2 99%   BMI 21.86 kg/m  Assessment & Plan:  1. Dizziness and giddiness Symptoms improved when patient held medications. Decrease dosages of blood pressure medications. Patient says that she has only taken the metoprolol once since it was started. Will hold metoprolol.   2. HYPERTENSION, BENIGN ESSENTIAL Blood pressure is at goal. Will return in 1 week for a blood pressure check.  - lisinopril (PRINIVIL,ZESTRIL) 10 MG tablet; Take 1 tablet (10 mg total) by mouth daily.  Dispense: 90 tablet; Refill: 0 - triamterene-hydrochlorothiazide (MAXZIDE) 75-50 MG tablet; Take 0.5 tablets by mouth daily.  Dispense: 90 tablet; Refill: 2  3. Hypokalemia Patient has an abnormal EKG, which is consistent with previous EKG; Patient may warrant referral to cardiology.  - EKG 12-Lead - Basic Metabolic Panel  4. Controlled type 2 diabetes mellitus without complication, without long-term current use of insulin (HCC) Hemoglobin a1C has improved slightly to 7.2, will continue Metformin as previously prescribed.  - HgB A1c  5. TOBACCO DEPENDENCE Smoking cessation instruction/counseling given:  counseled patient on the dangers of tobacco use, advised patient to stop smoking, and reviewed strategies to maximize success     Reviewed stat labs: Potassium 2.9, will continue KDur RTC: 1 week for blood pressure check Alfie Rideaux M, FNP

## 2016-09-05 NOTE — Patient Instructions (Addendum)
1. Hold Metoprolol 25 mg!! 2. Will decrease Lisinopril to 10 mg daily 3. Only take 1/2 triamterene-hydrochlorothiazide (75-50) 4. Will check potassium, will notify by phone with lab results 5. Recommend a lowfat, low carbohydrate diet divided over 5-6 small meals, increase water intake to 6-8 glasses, and 150 minutes per week of cardiovascular exercise.

## 2016-09-12 ENCOUNTER — Ambulatory Visit: Payer: Self-pay

## 2016-09-12 VITALS — BP 138/82

## 2016-09-12 DIAGNOSIS — I10 Essential (primary) hypertension: Secondary | ICD-10-CM

## 2016-09-18 ENCOUNTER — Ambulatory Visit: Payer: Self-pay | Attending: Internal Medicine

## 2016-11-03 ENCOUNTER — Other Ambulatory Visit: Payer: Self-pay | Admitting: Family Medicine

## 2016-11-17 ENCOUNTER — Ambulatory Visit: Payer: Self-pay | Admitting: Family Medicine

## 2016-11-24 ENCOUNTER — Ambulatory Visit: Payer: Self-pay | Admitting: Family Medicine

## 2016-12-03 ENCOUNTER — Ambulatory Visit: Payer: Self-pay | Attending: Internal Medicine

## 2016-12-08 ENCOUNTER — Other Ambulatory Visit: Payer: Self-pay | Admitting: Family Medicine

## 2016-12-08 DIAGNOSIS — I10 Essential (primary) hypertension: Secondary | ICD-10-CM

## 2017-01-16 ENCOUNTER — Ambulatory Visit (INDEPENDENT_AMBULATORY_CARE_PROVIDER_SITE_OTHER): Payer: Self-pay | Admitting: Family Medicine

## 2017-01-16 ENCOUNTER — Encounter: Payer: Self-pay | Admitting: Family Medicine

## 2017-01-16 VITALS — BP 146/84 | HR 48 | Temp 98.1°F | Resp 16 | Ht 69.0 in | Wt 153.0 lb

## 2017-01-16 DIAGNOSIS — F32A Depression, unspecified: Secondary | ICD-10-CM

## 2017-01-16 DIAGNOSIS — Z23 Encounter for immunization: Secondary | ICD-10-CM

## 2017-01-16 DIAGNOSIS — R001 Bradycardia, unspecified: Secondary | ICD-10-CM

## 2017-01-16 DIAGNOSIS — F329 Major depressive disorder, single episode, unspecified: Secondary | ICD-10-CM

## 2017-01-16 DIAGNOSIS — I252 Old myocardial infarction: Secondary | ICD-10-CM

## 2017-01-16 DIAGNOSIS — F17200 Nicotine dependence, unspecified, uncomplicated: Secondary | ICD-10-CM

## 2017-01-16 DIAGNOSIS — E119 Type 2 diabetes mellitus without complications: Secondary | ICD-10-CM

## 2017-01-16 DIAGNOSIS — R9431 Abnormal electrocardiogram [ECG] [EKG]: Secondary | ICD-10-CM

## 2017-01-16 DIAGNOSIS — R634 Abnormal weight loss: Secondary | ICD-10-CM

## 2017-01-16 DIAGNOSIS — I10 Essential (primary) hypertension: Secondary | ICD-10-CM

## 2017-01-16 LAB — CBC WITH DIFFERENTIAL/PLATELET
Basophils Absolute: 42 cells/uL (ref 0–200)
Basophils Relative: 1 %
EOS ABS: 42 {cells}/uL (ref 15–500)
Eosinophils Relative: 1 %
HEMATOCRIT: 39.6 % (ref 35.0–45.0)
HEMOGLOBIN: 13.2 g/dL (ref 11.7–15.5)
LYMPHS ABS: 1512 {cells}/uL (ref 850–3900)
Lymphocytes Relative: 36 %
MCH: 26.1 pg — AB (ref 27.0–33.0)
MCHC: 33.3 g/dL (ref 32.0–36.0)
MCV: 78.4 fL — AB (ref 80.0–100.0)
Monocytes Absolute: 420 cells/uL (ref 200–950)
Monocytes Relative: 10 %
NEUTROS ABS: 2184 {cells}/uL (ref 1500–7800)
Neutrophils Relative %: 52 %
Platelets: 139 10*3/uL — ABNORMAL LOW (ref 140–400)
RBC: 5.05 MIL/uL (ref 3.80–5.10)
RDW: 16.5 % — ABNORMAL HIGH (ref 11.0–15.0)
WBC: 4.2 10*3/uL (ref 3.8–10.8)

## 2017-01-16 LAB — POCT URINALYSIS DIP (DEVICE)
Bilirubin Urine: NEGATIVE
GLUCOSE, UA: NEGATIVE mg/dL
Hgb urine dipstick: NEGATIVE
KETONES UR: NEGATIVE mg/dL
LEUKOCYTES UA: NEGATIVE
Nitrite: NEGATIVE
PROTEIN: 30 mg/dL — AB
SPECIFIC GRAVITY, URINE: 1.015 (ref 1.005–1.030)
Urobilinogen, UA: 0.2 mg/dL (ref 0.0–1.0)
pH: 8.5 — ABNORMAL HIGH (ref 5.0–8.0)

## 2017-01-16 LAB — POCT GLYCOSYLATED HEMOGLOBIN (HGB A1C): Hemoglobin A1C: 7.1

## 2017-01-16 MED ORDER — CLONIDINE HCL 0.1 MG PO TABS
0.1000 mg | ORAL_TABLET | Freq: Once | ORAL | Status: AC
  Administered 2017-01-16: 0.1 mg via ORAL

## 2017-01-16 MED ORDER — BUPROPION HCL ER (XL) 150 MG PO TB24: 150.0000 mg | ORAL_TABLET | Freq: Every day | ORAL | 5 refills | Status: DC

## 2017-01-16 MED ORDER — BLOOD PRESSURE MONITORING DEVI: 1.0000 | Freq: Every day | 0 refills | Status: DC

## 2017-01-16 NOTE — Progress Notes (Signed)
Subjective:    Patient ID: Amber Suarez, female    DOB: 03-31-1956, 61 y.o.   MRN: [id number]  [name], a 61 year old female with a history of hypertension, type 2 diabetes mellitus, and MI presents for a follow up of chronic conditions. [name] has a markedly elevated blood pressure and bradycardia on arrival. She is on several antihypertensive medications. She denies dizziness, chest pain, neck pain, nausea, vomiting, or diarrhea.      Hypertension  This is a chronic problem. The problem is uncontrolled. Pertinent negatives include no anxiety, blurred vision, chest pain, headaches, malaise/fatigue, orthopnea, palpitations, peripheral edema, PND, shortness of breath or sweats. Risk factors for coronary artery disease include diabetes mellitus, dyslipidemia, post-menopausal state, sedentary lifestyle and smoking/tobacco exposure. Past treatments include ACE inhibitors, beta blockers, calcium channel blockers and diuretics. Compliance problems include diet.  There is no history of chronic renal disease, coarctation of the aorta, hyperaldosteronism, hypercortisolism, hyperparathyroidism, a hypertension causing med, pheochromocytoma, renovascular disease, sleep apnea or a thyroid problem.  Diabetes  She presents for her follow-up diabetic visit. She has type 2 diabetes mellitus. Her disease course has been stable. Pertinent negatives for hypoglycemia include no confusion, dizziness, headaches, hunger, mood changes, nervousness/anxiousness, pallor, seizures, sleepiness, sweats or tremors. Pertinent negatives for diabetes include no blurred vision, no chest pain, no polydipsia, no polyphagia and no polyuria. There are no hypoglycemic complications. There are no diabetic complications. Current diabetic treatment includes oral agent (monotherapy). She is compliant with treatment all of the time. An ACE inhibitor/angiotensin II receptor blocker is not being taken. She does not see a  podiatrist.Eye exam is not current.    Past Medical History:  Diagnosis Date  . CHF (congestive heart failure) (HCC)   . Diabetes mellitus   . Heart murmur   . Hyperlipidemia   . Hypertension   . Myocardial infarction (HCC)   . Stroke [facility])    Immunization History  Administered Date(s) Administered  . Influenza Split 06/26/2011, 07/14/2012  . Influenza Whole 06/15/2008  . Pneumococcal Polysaccharide-23 10/27/2013  . Td 01/18/2004   Allergies  Allergen Reactions  . Morphine And Related Itching and Other (See Comments)    burning    Review of Systems  Constitutional: Positive for unexpected weight change (weight loss). Negative for malaise/fatigue.  HENT: Negative.   Eyes: Negative.  Negative for blurred vision, photophobia and visual disturbance.  Respiratory: Negative.  Negative for shortness of breath.   Cardiovascular: Negative.  Negative for chest pain, palpitations, orthopnea, leg swelling and PND.  Gastrointestinal: Negative.   Endocrine: Negative.  Negative for polydipsia, polyphagia and polyuria.  Genitourinary: Negative.   Musculoskeletal: Negative.   Skin: Negative.  Negative for pallor.  Allergic/Immunologic: Negative.   Neurological: Negative for dizziness, tremors, seizures and headaches.  Hematological: Negative.   Psychiatric/Behavioral: Negative.  Negative for confusion. The patient is not nervous/anxious.        Depression       Objective:   Physical Exam  Constitutional: She is oriented to person, place, and time. She appears well-developed and well-nourished.  HENT:  Head: Normocephalic and atraumatic.  Right Ear: External ear normal.  Left Ear: External ear normal.  Nose: Nose normal.  Mouth/Throat: Oropharynx is clear and moist.  Eyes: Conjunctivae and EOM are normal. Pupils are equal, round, and reactive to light.  Neck: Normal range of motion. Neck supple.  Cardiovascular: Regular rhythm, normal heart sounds, intact distal pulses and  normal pulses.  Bradycardia present.  Pulmonary/Chest: Effort normal and breath sounds normal.  Abdominal: Soft. Bowel sounds are normal.  Musculoskeletal: Normal range of motion.  Neurological: She is alert and oriented to person, place, and time. She has normal reflexes.  Dizziness not reproducible on physical exam  Skin: Skin is warm and dry.  Psychiatric: She has a normal mood and affect. Her behavior is normal. Judgment and thought content normal.      BP (!) 192/110 (BP Location: Right Arm, Patient Position: Sitting, Cuff Size: Normal) Comment: manual  Pulse (!) 48   Temp 98.1 F (36.7 C) (Oral)   Resp 16   Ht 5\' 9"  (1.753 m)   Wt 153 lb (69.4 kg)   SpO2 99%   BMI 22.59 kg/m  Assessment & Plan:  1. Accelerated hypertension Blood pressure was markedly elevated on arrival. Patient administered Clonidine 0.1 mg. BP decreased after 30 minutes.   height is 5\' 9"  (1.753 m) and weight is 153 lb (69.4 kg). Her oral temperature is 98.1 F (36.7 C). Her blood pressure is 146/84 (abnormal) and her pulse is 48 (abnormal). Her respiration is 16 and oxygen saturation is 99%.   - POCT urinalysis dip (device) - cloNIDine (CATAPRES) tablet 0.1 mg; Take 1 tablet (0.1 mg total) by mouth once. - Blood Pressure Monitoring DEVI; 1 each by Does not apply route daily.  Dispense: 1 Device; Refill: 0 - TSH - COMPLETE METABOLIC PANEL WITH GFR - CBC with Differential - Ambulatory referral to Cardiology   2. Abnormal finding on EKG EKG abnormal with markedly elevated on arrival, also pulse 48.   - EKG 12-Lead - Blood Pressure Monitoring DEVI; 1 each by Does not apply route daily.  Dispense: 1 Device; Refill: 0 - Ambulatory referral to Cardiology  4. History of MI (myocardial infarction) Patient has a history of MI and is not being followed by cardiology.  - Ambulatory referral to Cardiology  5. Controlled type 2 diabetes mellitus without complication, without long-term current use of insulin  (HCC) - HgB A1c - POCT urinalysis dip (device)  6. Weight loss - TSH  7. Bradycardia - EKG 12-Lead - Ambulatory referral to Cardiology  8. Tobacco dependence Smoking cessation instruction/counseling given:  counseled patient on the dangers of tobacco use, advised patient to stop smoking, and reviewed strategies to maximize success  9. Need for Tdap vaccination - Tdap vaccine greater than or equal to 7yo IM  10. Depression, unspecified depression type - buPROPion (WELLBUTRIN XL) 150 MG 24 hr tablet; Take 1 tablet (150 mg total) by mouth daily.  Dispense: 30 tablet; Refill: 5    RTC: 1 month for follow up of chronic conditions   Codey Burling Rennis PettyMoore Tikesha Mort  MSN, FNP-C [facility] [address]  [place], [place] [address] [phone]

## 2017-01-16 NOTE — ED Notes (Signed)
Per Armeniahina at [facility]-sending patient over for HTN and low HR-patient is asymptomatic-patient received clonidine in clinic

## 2017-01-16 NOTE — Patient Instructions (Signed)
Check blood pressure daily and as needed. Maintain a diary. Report to the emergency department for any blood pressure greater than 190/100.  Follow up in the office for hypertension on Tuesday, May 8th.   Will send a referral to cardiology  Will start Wellbutrin 150 mg daily for tobacco dependence.

## 2017-01-17 LAB — COMPLETE METABOLIC PANEL WITH GFR
ALT: 4 U/L — AB (ref 6–29)
AST: 11 U/L (ref 10–35)
Albumin: 4 g/dL (ref 3.6–5.1)
Alkaline Phosphatase: 64 U/L (ref 33–130)
BUN: 11 mg/dL (ref 7–25)
CHLORIDE: 99 mmol/L (ref 98–110)
CO2: 30 mmol/L (ref 20–31)
CREATININE: 1.02 mg/dL — AB (ref 0.50–0.99)
Calcium: 9.3 mg/dL (ref 8.6–10.4)
GFR, EST AFRICAN AMERICAN: 68 mL/min (ref >=60)
GFR, EST NON AFRICAN AMERICAN: 59 mL/min — AB (ref >=60)
Glucose, Bld: 100 mg/dL — ABNORMAL HIGH (ref 65–99)
Potassium: 2.9 mmol/L — ABNORMAL LOW (ref 3.5–5.3)
SODIUM: 141 mmol/L (ref 135–146)
Total Bilirubin: 0.4 mg/dL (ref 0.2–1.2)
Total Protein: 7.4 g/dL (ref 6.1–8.1)

## 2017-01-17 LAB — TSH: TSH: 1.15 m[IU]/L

## 2017-01-20 ENCOUNTER — Ambulatory Visit: Payer: No Typology Code available for payment source | Admitting: Family Medicine

## 2017-01-22 ENCOUNTER — Encounter: Payer: Self-pay | Admitting: Family Medicine

## 2017-01-22 ENCOUNTER — Ambulatory Visit (HOSPITAL_COMMUNITY)
Admission: RE | Admit: 2017-01-22 | Discharge: 2017-01-22 | Disposition: A | Discharge status: Home / Self Care | Payer: No Typology Code available for payment source | Source: Ambulatory Visit | Attending: Family Medicine | Admitting: Family Medicine

## 2017-01-22 ENCOUNTER — Ambulatory Visit (INDEPENDENT_AMBULATORY_CARE_PROVIDER_SITE_OTHER): Payer: No Typology Code available for payment source | Admitting: Family Medicine

## 2017-01-22 ENCOUNTER — Ambulatory Visit (INDEPENDENT_AMBULATORY_CARE_PROVIDER_SITE_OTHER): Payer: No Typology Code available for payment source | Admitting: Cardiovascular Disease

## 2017-01-22 ENCOUNTER — Encounter (INDEPENDENT_AMBULATORY_CARE_PROVIDER_SITE_OTHER): Payer: Self-pay

## 2017-01-22 ENCOUNTER — Encounter: Payer: Self-pay | Admitting: Cardiovascular Disease

## 2017-01-22 VITALS — BP 116/82 | HR 48 | Ht 69.5 in | Wt 151.0 lb

## 2017-01-22 VITALS — BP 164/84 | HR 56 | Temp 98.0°F | Resp 14 | Ht 69.0 in | Wt 151.0 lb

## 2017-01-22 DIAGNOSIS — E269 Hyperaldosteronism, unspecified: Secondary | ICD-10-CM | POA: Insufficient documentation

## 2017-01-22 DIAGNOSIS — I200 Unstable angina: Secondary | ICD-10-CM

## 2017-01-22 DIAGNOSIS — E876 Hypokalemia: Secondary | ICD-10-CM

## 2017-01-22 DIAGNOSIS — I10 Essential (primary) hypertension: Secondary | ICD-10-CM | POA: Insufficient documentation

## 2017-01-22 DIAGNOSIS — I152 Hypertension secondary to endocrine disorders: Secondary | ICD-10-CM

## 2017-01-22 DIAGNOSIS — R001 Bradycardia, unspecified: Secondary | ICD-10-CM

## 2017-01-22 LAB — BASIC METABOLIC PANEL
ANION GAP: 10 (ref 5–15)
BUN: 14 mg/dL (ref 6–20)
CALCIUM: 9.4 mg/dL (ref 8.9–10.3)
CHLORIDE: 97 mmol/L — AB (ref 101–111)
CO2: 32 mmol/L (ref 22–32)
Creatinine, Ser: 1.07 mg/dL — ABNORMAL HIGH (ref 0.44–1.00)
GFR calc non Af Amer: 54 mL/min — ABNORMAL LOW (ref >60)
GLUCOSE: 105 mg/dL — AB (ref 65–99)
POTASSIUM: 2.8 mmol/L — AB (ref 3.5–5.1)
Sodium: 139 mmol/L (ref 135–145)

## 2017-01-22 MED ORDER — SPIRONOLACTONE 25 MG PO TABS: 25.0000 mg | ORAL_TABLET | Freq: Every day | ORAL | 3 refills | Status: DC

## 2017-01-22 MED ORDER — CLONIDINE HCL 0.1 MG PO TABS
0.2000 mg | ORAL_TABLET | Freq: Once | ORAL | Status: AC
  Administered 2017-01-22: 0.2 mg via ORAL

## 2017-01-22 NOTE — Progress Notes (Signed)
Pt here to have blood drawn for a BMP.  Provider: C. Hart RochesterHollis, NP

## 2017-01-22 NOTE — Patient Instructions (Addendum)
Medication Instructions:  STOP Triamterene/HCTZ (Maxzide) STOP Kdur (Potassium)  START Aldactone 25 mg once daily   Labwork: TODAY - aldosterone, renin   Testing/Procedures: None Ordered   Follow-Up: Your physician recommends that you return for a Nurse visit and lab work on Thursday May 17 at 2:00 pm  Your physician recommends that you schedule a follow-up appointment in: 4-6 weeks with Dr. Elease HashimotoNahser   If you need a refill on your cardiac medications before your next appointment, please call your pharmacy.   Thank you for choosing [facility]! Eligha BridegroomMichelle Marguerita Stapp, RN [phone]

## 2017-01-22 NOTE — Progress Notes (Signed)
Cardiology Office Note   Date:  01/22/2017   ID:  Amber Suarez Nov 04, 1955, MRN [id number]  PCP:  Massie Maroon, FNP  Cardiologist:   Kristeen Miss, MD   Chief Complaint  Patient presents with  . Bradycardia   Problem list 1. Bradycardia 2. Essential hypertension 3. History of coronary artery disease 4. Hyperlipidemia 5. Diabetes mellitus 6. Cerebral aneurism with CVA - 1997.    History of Present Illness:  Modelle HAYLEN Suarez is a 62 y.o. female who is being seen today for the evaluation of bradycardia and HTN  at the request of Massie Maroon, FNP. Seen with daughter, [name].   She has been seen by Dr. Herbie Baltimore in the past. She previously saw Dr. Clarene Duke. She had a cardiac catheterization in May, 2013 which revealed nonobstructive coronary artery disease with small vessel ectasia. 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  She has not had any recent chest pain  No syncope or presyncope  She was at her medical doctors office today for routine medical checkup. She was noted have a slow heart rate and hypertension.  BP  164/84 with HR of 54.  She received clonidine 0.2 mg orally at her medical doctors office and her blood pressure is much better.  HR has slowed further ( due to the clonidine )     Past Medical History:  Diagnosis Date  . CHF (congestive heart failure) (HCC)   . Diabetes mellitus   . Heart murmur   . Hyperlipidemia   . Hypertension   . Myocardial infarction (HCC)   . Stroke [facility])     Past Surgical History:  Procedure Laterality Date  . ABDOMINAL HYSTERECTOMY    . angoiplasty    . LEFT HEART CATHETERIZATION WITH CORONARY ANGIOGRAM N/A 01/28/2012   Procedure: LEFT HEART CATHETERIZATION WITH CORONARY ANGIOGRAM;  Surgeon: Marykay Lex, MD;  Location: [facility] CATH LAB;  Service: Cardiovascular;  Laterality: N/A;  . repair of aneursym  1997     Current Outpatient Prescriptions  Medication Sig Dispense Refill  . Blood Pressure Monitoring  DEVI 1 each by Does not apply route daily. 1 Device 0  . buPROPion (WELLBUTRIN XL) 150 MG 24 hr tablet Take 1 tablet (150 mg total) by mouth daily. 30 tablet 5  . glimepiride (AMARYL) 2 MG tablet Take 1 tablet (2 mg total) by mouth daily before breakfast. 30 tablet 3  . isosorbide mononitrate (IMDUR) 30 MG 24 hr tablet TAKE 1 TABLET BY MOUTH DAILY 30 tablet 3  . lisinopril (PRINIVIL,ZESTRIL) 10 MG tablet Take 1 tablet (10 mg total) by mouth daily. 90 tablet 0  . meloxicam (MOBIC) 7.5 MG tablet Take 7.5 mg by mouth daily.    . metFORMIN (GLUCOPHAGE-XR) 500 MG 24 hr tablet Take 2 tablets (1,000 mg total) by mouth daily. 180 tablet 2  . nitroGLYCERIN (NITROSTAT) 0.4 MG SL tablet Place 0.4 mg under the tongue every 5 (five) minutes as needed. Call 911 if no improvement after 3rd tab     . ondansetron (ZOFRAN ODT) 4 MG disintegrating tablet Take 1 tablet (4 mg total) by mouth every 8 (eight) hours as needed for nausea or vomiting. 8 tablet 0  . potassium chloride (KLOR-CON M10) 10 MEQ tablet Take 1 tablet (10 mEq total) by mouth daily. 90 tablet 2  . pravastatin (PRAVACHOL) 40 MG tablet TAKE 1 TABLET BY MOUTH DAILY 30 tablet 3  . triamterene-hydrochlorothiazide (MAXZIDE) 75-50 MG tablet TAKE 1 TABLET BY MOUTH DAILY 90  tablet 0   No current facility-administered medications for this visit.     PAD Screen 01/22/2017  Previous PAD dx? No  Previous surgical procedure? No  Pain with walking? No  Feet/toe relief with dangling? No  Painful, non-healing ulcers? No  Extremities discolored? No      Allergies:   Morphine and related    Social History:  The patient  reports that she has been smoking Cigarettes.  She has a 40.00 pack-year smoking history. She has never used smokeless tobacco. She reports that she does not drink alcohol or use drugs.   Family History:  The patient's family history includes Diabetes in her mother; Hypertension in her father, mother, and sister.    ROS:  Please see the  history of present illness.    Review of Systems: Constitutional:  denies fever, chills, diaphoresis, appetite change and fatigue.  HEENT: denies photophobia, eye pain, redness, hearing loss, ear pain, congestion, sore throat, rhinorrhea, sneezing, neck pain, neck stiffness and tinnitus.  Respiratory: denies SOB, DOE, cough, chest tightness, and wheezing.  Cardiovascular: denies chest pain, palpitations and leg swelling.  Gastrointestinal: denies nausea, vomiting, abdominal pain, diarrhea, constipation, blood in stool.  Genitourinary: denies dysuria, urgency, frequency, hematuria, flank pain and difficulty urinating.  Musculoskeletal: denies  myalgias, back pain, joint swelling, arthralgias and gait problem.   Skin: denies pallor, rash and wound.  Neurological: denies dizziness, seizures, syncope, weakness, light-headedness, numbness and headaches.   Hematological: denies adenopathy, easy bruising, personal or family bleeding history.  Psychiatric/ Behavioral: denies suicidal ideation, mood changes, confusion, nervousness, sleep disturbance and agitation.       All other systems are reviewed and negative.    PHYSICAL EXAM: VS:  BP 116/82 (BP Location: Left Arm)   Pulse (!) 48   Ht 5' 9.5" (1.765 m)   Wt 151 lb (68.5 kg)   BMI 21.98 kg/m  , BMI Body mass index is 21.98 kg/m. GEN: Well nourished, well developed, in no acute distress  HEENT: normal  Neck: no JVD, carotid bruits, or masses Cardiac: RRR; no murmurs, rubs, or gallops,no edema  Respiratory:  clear to auscultation bilaterally, normal work of breathing GI: soft, nontender, nondistended, + BS MS: no deformity or atrophy  Skin: warm and dry, no rash Neuro:  Strength and sensation are intact Psych: normal   EKG:  EKG is ordered today. The ekg ordered Jan 22, 2017  demonstrates  Marked sinus brady .   NS T wave abn.   Recent Labs: 01/16/2017: ALT 4; Hemoglobin 13.2; Platelets 139; TSH 1.15 01/22/2017: BUN 14;  Creatinine, Ser 1.07; Potassium 2.8; Sodium 139    Lipid Panel    Component Value Date/Time   CHOL 145 08/15/2016 0845   TRIG 94 08/15/2016 0845   HDL 31 (L) 08/15/2016 0845   CHOLHDL 4.7 08/15/2016 0845   VLDL 19 08/15/2016 0845   LDLCALC 95 08/15/2016 0845   LDLDIRECT 133 (H) 07/14/2012 1402      Wt Readings from Last 3 Encounters:  01/22/17 151 lb (68.5 kg)  01/22/17 151 lb (68.5 kg)  01/16/17 153 lb (69.4 kg)      Other studies Reviewed: Additional studies/ records that were reviewed today include: . Review of the above records demonstrates:    ASSESSMENT AND PLAN:  1. Sinus Bradycardia: Brenisha  presents with hypertension and bradycardia. She is basically asymptomatic. Her blood pressure is much better after receiving the clonidine however her heart rate is slower. I do not think the clonidine will  be a good long-term medication for her since she is already bradycardic and we would expect further bradycardia with the clonidine.  In reviewing her labs, she is on an ACE inhibitor, supplemental potassium, and a potassium sparing diuretic. Despite this, her potassium level is 2.8. This is consistent with an aldosterone excess state. We'll draw renin levels and aldosterone levels. We'll discontinue the Maxzide and start her on spironolactone 25 mg a day.    We'll see her in one week for a basic metabolic profile and a nurse visit to check her blood pressure. I will see her back in 4-6 weeks for follow-up visit.  2. Coronary artery disease: She had a cardiac catheterization in 2013 by Dr. Herbie Baltimore. She was found have mild nonobstructive disease. She has some T-wave abnormalities that may be due to her hypokalemia but we may need to consider further testing. Fortunately, she is not having any angina.  3. Hypertension: Her blood pressure has been moderately elevated. We will make the above-noted changes. She does not have a lot of salty foods. She is bradycardic at this point so I  do not think that she'll tolerate a beta blocker. We'll make further adjustments in future visits.    Current medicines are reviewed at length with the patient today.  The patient does not have concerns regarding medicines.  Labs/ tests ordered today include:  No orders of the defined types were placed in this encounter.    Disposition:   FU with the nurse in 1 week and will see me in 4-6 weeks.      Kristeen Miss, MD  01/22/2017 1:41 PM    [facility] [address] [place], [place], [place]  [address] Phone: [phone]; Fax: [phone]

## 2017-01-22 NOTE — Progress Notes (Signed)
Subjective:    Patient ID: Amber Suarez, female    DOB: 04/23/1956, 61 y.o.   MRN: [id number]  Amber Suarez, a 61 year old female with a history of hypertension, type 2 diabetes mellitus, and MI presents for a follow up of accelerated hypertension. Patient presented 1 week ago with a markedly elevated blood pressure and bradycardia. She is on several antihypertensive medications. A referral was sent to cardiology. She has a history of unstable angina and myocardial infarction and has not been followed by cardiology. A referral has been sent to cardiology for further workup and evaluation.  She denies dizziness, chest pain, neck pain, nausea, vomiting, or diarrhea.      Hypertension  This is a chronic problem. The problem is uncontrolled. Pertinent negatives include no anxiety, malaise/fatigue, orthopnea, palpitations, peripheral edema, PND or shortness of breath. Risk factors for coronary artery disease include diabetes mellitus, dyslipidemia, post-menopausal state, sedentary lifestyle and smoking/tobacco exposure. Past treatments include ACE inhibitors, beta blockers, calcium channel blockers and diuretics. Compliance problems include diet.  Hypertensive end-organ damage includes angina. There is no history of chronic renal disease, coarctation of the aorta, hyperaldosteronism, hypercortisolism, hyperparathyroidism, a hypertension causing med, pheochromocytoma, renovascular disease, sleep apnea or a thyroid problem.    Past Medical History:  Diagnosis Date  . CHF (congestive heart failure) (HCC)   . Diabetes mellitus   . Heart murmur   . Hyperlipidemia   . Hypertension   . Myocardial infarction (HCC)   . Stroke [facility])    Immunization History  Administered Date(s) Administered  . Influenza Split 06/26/2011, 07/14/2012  . Influenza Whole 06/15/2008  . Pneumococcal Polysaccharide-23 10/27/2013  . Td 01/18/2004  . Tdap 01/16/2017   Allergies  Allergen Reactions  . Morphine  And Related Itching and Other (See Comments)    burning    Review of Systems  Constitutional: Positive for unexpected weight change (weight loss). Negative for malaise/fatigue.  HENT: Negative.   Eyes: Negative.  Negative for photophobia and visual disturbance.  Respiratory: Negative.  Negative for shortness of breath.   Cardiovascular: Negative.  Negative for palpitations, orthopnea, leg swelling and PND.  Gastrointestinal: Negative.   Endocrine: Negative.   Genitourinary: Negative.   Musculoskeletal: Negative.   Skin: Negative.   Allergic/Immunologic: Negative.   Hematological: Negative.   Psychiatric/Behavioral: Negative.        Depression       Objective:   Physical Exam  Constitutional: She is oriented to person, place, and time. She appears well-developed and well-nourished.  HENT:  Head: Normocephalic and atraumatic.  Right Ear: External ear normal.  Left Ear: External ear normal.  Nose: Nose normal.  Mouth/Throat: Oropharynx is clear and moist.  Eyes: Conjunctivae and EOM are normal. Pupils are equal, round, and reactive to light.  Neck: Normal range of motion. Neck supple.  Cardiovascular: Regular rhythm, normal heart sounds, intact distal pulses and normal pulses.  Bradycardia present.   Pulmonary/Chest: Effort normal and breath sounds normal.  Abdominal: Soft. Bowel sounds are normal.  Musculoskeletal: Normal range of motion.  Neurological: She is alert and oriented to person, place, and time. She has normal reflexes.  Dizziness not reproducible on physical exam  Skin: Skin is warm and dry.  Psychiatric: She has a normal mood and affect. Her behavior is normal. Judgment and thought content normal.      BP (!) 186/82 (BP Location: Right Arm, Patient Position: Sitting, Cuff Size: Normal) Comment: manual  Pulse (!) 56   Temp 98 F (  36.7 C) (Oral)   Resp 14   Ht 5\' 9"  (1.753 m)   Wt 151 lb (68.5 kg)   SpO2 100%   BMI 22.30 kg/m  Assessment & Plan:  1.  Accelerated hypertension Blood pressure was markedly elevated on arrival. Patient administered Clonidine 0.1 mg. BP decreased after 30 minutes.   height is 5\' 9"  (1.753 m) and weight is 151 lb (68.5 kg). Her oral temperature is 98 F (36.7 C). Her blood pressure is 164/84 (abnormal) and her pulse is 56 (abnormal). Her respiration is 14 and oxygen saturation is 100%.  - cloNIDine (CATAPRES) tablet 0.2 mg; Take 2 tablets (0.2 mg total) by mouth once. - ECHOCARDIOGRAM COMPLETE; Future  2. Unstable angina [facility](HCC) Patient has a history of unstable angina. She is currently asymptomatic. Called cardiology and scheduled an appointment for cardiology at 1:20 pm. I will defer to cardiology for further treatment and evaluation.  - ECHOCARDIOGRAM COMPLETE; Future  3. Bradycardia - ECHOCARDIOGRAM COMPLETE; Future  4. Hypokalemia Potassium was decreased at 2.9, will order a BMP    Nolon NationsLaChina Moore Hollis  MSN, FNP-C [facility] [address]  [place], [place] [id number] [phone]

## 2017-01-22 NOTE — Patient Instructions (Addendum)
Patient has an appointment scheduled with cardiology at 1: 15. Will order a stat BMP and follow up in 1 month for chronic conditions.     Bradycardia, Adult Bradycardia is a slower-than-normal heartbeat. A normal resting heart rate for an adult ranges from 60 to 100 beats per minute. With bradycardia, the resting heart rate is less than 60 beats per minute. Bradycardia can prevent enough oxygen from reaching certain areas of your body when you are active. It can be serious if it keeps enough oxygen from reaching your brain and other parts of your body. Bradycardia is not a problem for everyone. For some healthy adults, a slow resting heart rate is normal. What are the causes? This condition may be caused by:  A problem with the heart, including:  A problem with the heart's electrical system, such as a heart block.  A problem with the heart's natural pacemaker (sinus node).  Heart disease.  A heart attack.  Heart damage.  A heart infection.  A heart condition that is present at birth (congenital heart defect).  Certain medicines that treat heart conditions.  Certain conditions, such as hypothyroidism and obstructive sleep apnea.  Problems with the balance of chemicals and other substances, like potassium, in the blood. What increases the risk? This condition is more likely to develop in adults who:  Are age 865 or older.  Have high blood pressure (hypertension), high cholesterol (hyperlipidemia), or diabetes.  Drink heavily, use tobacco or nicotine products, or use drugs.  Are stressed. What are the signs or symptoms? Symptoms of this condition include:  Light-headedness.  Feeling faint or fainting.  Fatigue and weakness.  Shortness of breath.  Chest pain (angina).  Drowsiness.  Confusion.  Dizziness. How is this diagnosed? This condition may be diagnosed based on:  Your symptoms.  Your medical history.  A physical exam. During the exam, your health  care provider will listen to your heartbeat and check your pulse. To confirm the diagnosis, your health care provider may order tests, such as:  Blood tests.  An electrocardiogram (ECG). This test records the heart's electrical activity. The test can show how fast your heart is beating and whether the heartbeat is steady.  A test in which you wear a portable device (event recorder or Holter monitor) to record your heart's electrical activity while you go about your day.  Anexercise test. How is this treated? Treatment for this condition depends on the cause of the condition and how severe your symptoms are. Treatment may involve:  Treatment of the underlying condition.  Changing your medicines or how much medicine you take.  Having a small, battery-operated device called a pacemaker implanted under the skin. When bradycardia occurs, this device can be used to increase your heart rate and help your heart to beat in a regular rhythm. Follow these instructions at home: Lifestyle    Manage any health conditions that contribute to bradycardia as told by your health care provider.  Follow a heart-healthy diet. A nutrition specialist (dietitian) can help to educate you about healthy food options and changes.  Follow an exercise program that is approved by your health care provider.  Maintain a healthy weight.  Try to reduce or manage your stress, such as with yoga or meditation. If you need help reducing stress, ask your health care provider.  Do not use use any products that contain nicotine or tobacco, such as cigarettes and e-cigarettes. If you need help quitting, ask your health care provider.  Do  not use illegal drugs.  Limit alcohol intake to no more than 1 drink per day for nonpregnant women and 2 drinks per day for men. One drink equals 12 oz of beer, 5 oz of wine, or 1 oz of hard liquor. General instructions   Take over-the-counter and prescription medicines only as told by  your health care provider.  Keep all follow-up visits as directed by your health care provider. This is important. How is this prevented? In some cases, bradycardia may be prevented by:  Treating underlying medical problems.  Stopping behaviors or medicines that can trigger the condition. Contact a health care provider if:  You feel light-headed or dizzy.  You almost faint.  You feel weak or are easily fatigued during physical activity.  You experience confusion or have memory problems. Get help right away if:  You faint.  You have an irregular heartbeat (palpitations).  You have chest pain.  You have trouble breathing. This information is not intended to replace advice given to you by your health care provider. Make sure you discuss any questions you have with your health care provider. Document Released: 05/24/2002 Document Revised: 04/29/2016 Document Reviewed: 02/21/2016 Elsevier Interactive Patient Education  2017 [employer] Managing Your Hypertension Hypertension is commonly called high blood pressure. This is when the force of your blood pressing against the walls of your arteries is too strong. Arteries are blood vessels that carry blood from your heart throughout your body. Hypertension forces the heart to work harder to pump blood, and may cause the arteries to become narrow or stiff. Having untreated or uncontrolled hypertension can cause heart attack, stroke, kidney disease, and other problems. What are blood pressure readings? A blood pressure reading consists of a higher number over a lower number. Ideally, your blood pressure should be below 120/80. The first ("top") number is called the systolic pressure. It is a measure of the pressure in your arteries as your heart beats. The second ("bottom") number is called the diastolic pressure. It is a measure of the pressure in your arteries as the heart relaxes. What does my blood pressure reading mean? Blood pressure  is classified into four stages. Based on your blood pressure reading, your health care provider may use the following stages to determine what type of treatment you need, if any. Systolic pressure and diastolic pressure are measured in a unit called mm Hg. Normal   Systolic pressure: below 120.  Diastolic pressure: below 80. Elevated   Systolic pressure: 120-129.  Diastolic pressure: below 80. Hypertension stage 1     Diastolic pressure: 80-89. Hypertension stage 2   Systolic pressure: 140 or above.  Diastolic pressure: 90 or above. What health risks are associated with hypertension? Managing your hypertension is an important responsibility. Uncontrolled hypertension can lead to:  A heart attack.  A stroke.  A weakened blood vessel (aneurysm).  Heart failure.  Kidney damage.  Eye damage.  Metabolic syndrome.  Memory and concentration problems. What changes can I make to manage my hypertension? Eating and drinking   Eat a diet that is high in fiber and potassium, and low in salt (sodium), added sugar, and fat. An example eating plan is called the DASH (Dietary Approaches to Stop Hypertension) diet. To eat this way:  Eat plenty of fresh fruits and vegetables. Try to fill half of your plate at each meal with fruits and vegetables.  Eat whole grains, such as whole wheat pasta, brown rice, or whole grain bread. Fill about one quarter  of your plate with whole grains.  Eat low-fat diary products.  Avoid fatty cuts of meat, processed or cured meats, and poultry with skin. Fill about one quarter of your plate with lean proteins such as fish, chicken without skin, beans, eggs, and tofu.  Avoid premade and processed foods. These tend to be higher in sodium, added sugar, and fat.     Lifestyle   Work with your health care provider to maintain a healthy body weight, or to lose weight. Ask what an ideal weight is for you.  Get at least 30 minutes of exercise that causes  your heart to beat faster (aerobic exercise) most days of the week. Activities may include walking, swimming, or biking.       Monitoring   Monitor your blood pressure at home as told by your health care provider. Your personal target blood pressure may vary depending on your medical conditions, your age, and other factors.  Have your blood pressure checked regularly, as often as told by your health care provider. Working with your health care provider   Review all the medicines you take with your health care provider because there may be side effects or interactions.  Talk with your health care provider about your diet, exercise habits, and other lifestyle factors that may be contributing to hypertension.  Visit your health care provider regularly. Your health care provider can help you create and adjust your plan for managing hypertension. Will I need medicine to control my blood pressure? Your health care provider may prescribe medicine if lifestyle changes are not enough to get your blood pressure under control, and if:  Your systolic blood pressure is 130 or higher.  Your diastolic blood pressure is 80 or higher. Take medicines only as told by your health care provider. Follow the directions carefully. Blood pressure medicines must be taken as prescribed. The medicine does not work as well when you skip doses. Skipping doses also puts you at risk for problems. Contact a health care provider if:  You think you are having a reaction to medicines you have taken.  You have repeated (recurrent) headaches.  You feel dizzy.  You have swelling in your ankles.  You have trouble with your vision. Get help right away if:  You develop a severe headache or confusion.  You have unusual weakness or numbness, or you feel faint.  You have severe pain in your chest or abdomen.  You vomit repeatedly.  You have trouble breathing. Summary  Hypertension is when the force of blood  pumping through your arteries is too strong. If this condition is not controlled, it may put you at risk for serious complications.  Your personal target blood pressure may vary depending on your medical conditions, your age, and other factors. For most people, a normal blood pressure is less than 120/80.  Hypertension is managed by lifestyle changes, medicines, or both. Lifestyle changes include weight loss, eating a healthy, low-sodium diet, exercising more, and limiting alcohol. This information is not intended to replace advice given to you by your health care provider. Make sure you discuss any questions you have with your health care provider. Document Released: 05/26/2012 Document Revised: 07/30/2016 Document Reviewed: 07/30/2016 Elsevier Interactive Patient Education  2017 [employer].

## 2017-01-23 ENCOUNTER — Encounter: Payer: Self-pay | Admitting: Cardiovascular Disease

## 2017-01-26 ENCOUNTER — Ambulatory Visit (HOSPITAL_COMMUNITY)
Admission: RE | Admit: 2017-01-26 | Discharge: 2017-01-26 | Disposition: A | Discharge status: Home / Self Care | Payer: No Typology Code available for payment source | Source: Ambulatory Visit | Attending: Family Medicine | Admitting: Family Medicine

## 2017-01-26 DIAGNOSIS — Z720 Tobacco use: Secondary | ICD-10-CM | POA: Insufficient documentation

## 2017-01-26 DIAGNOSIS — R001 Bradycardia, unspecified: Secondary | ICD-10-CM | POA: Insufficient documentation

## 2017-01-26 DIAGNOSIS — E119 Type 2 diabetes mellitus without complications: Secondary | ICD-10-CM | POA: Insufficient documentation

## 2017-01-26 DIAGNOSIS — I200 Unstable angina: Secondary | ICD-10-CM | POA: Insufficient documentation

## 2017-01-26 DIAGNOSIS — I083 Combined rheumatic disorders of mitral, aortic and tricuspid valves: Secondary | ICD-10-CM | POA: Insufficient documentation

## 2017-01-26 DIAGNOSIS — I10 Essential (primary) hypertension: Secondary | ICD-10-CM

## 2017-01-26 NOTE — Progress Notes (Signed)
  Echocardiogram 2D Echocardiogram has been performed.  Nolon RodBrown, Tony 01/26/2017, 12:03 PM

## 2017-01-28 LAB — ALDOSTERONE + RENIN ACTIVITY W/ RATIO
ALDOS/RENIN RATIO: 121.1 — AB (ref 0.0–30.0)
ALDOSTERONE: 21.2 ng/dL (ref 0.0–30.0)
RENIN: 0.175 ng/mL/h (ref 0.167–5.380)

## 2017-01-29 ENCOUNTER — Other Ambulatory Visit: Payer: No Typology Code available for payment source

## 2017-01-29 ENCOUNTER — Ambulatory Visit: Payer: No Typology Code available for payment source

## 2017-01-29 DIAGNOSIS — I10 Essential (primary) hypertension: Secondary | ICD-10-CM

## 2017-01-29 DIAGNOSIS — E269 Hyperaldosteronism, unspecified: Secondary | ICD-10-CM

## 2017-01-30 LAB — BASIC METABOLIC PANEL
BUN/Creatinine Ratio: 12 (ref 12–28)
BUN: 12 mg/dL (ref 8–27)
CALCIUM: 8.9 mg/dL (ref 8.7–10.3)
CO2: 32 mmol/L — AB (ref 18–29)
CREATININE: 0.99 mg/dL (ref 0.57–1.00)
Chloride: 94 mmol/L — ABNORMAL LOW (ref 96–106)
GFR calc Af Amer: 71 mL/min/{1.73_m2} (ref >59)
GFR, EST NON AFRICAN AMERICAN: 61 mL/min/{1.73_m2} (ref >59)
Glucose: 115 mg/dL — ABNORMAL HIGH (ref 65–99)
Potassium: 2.9 mmol/L — ABNORMAL LOW (ref 3.5–5.2)
Sodium: 140 mmol/L (ref 134–144)

## 2017-02-19 ENCOUNTER — Ambulatory Visit (INDEPENDENT_AMBULATORY_CARE_PROVIDER_SITE_OTHER): Payer: No Typology Code available for payment source | Admitting: Cardiovascular Disease

## 2017-02-19 ENCOUNTER — Encounter: Payer: Self-pay | Admitting: Cardiovascular Disease

## 2017-02-19 VITALS — BP 136/74 | HR 74 | Ht 69.0 in | Wt 148.1 lb

## 2017-02-19 DIAGNOSIS — E876 Hypokalemia: Secondary | ICD-10-CM

## 2017-02-19 DIAGNOSIS — I10 Essential (primary) hypertension: Secondary | ICD-10-CM

## 2017-02-19 MED ORDER — POTASSIUM CHLORIDE ER 10 MEQ PO TBCR: 10.0000 meq | EXTENDED_RELEASE_TABLET | Freq: Every day | ORAL | 11 refills | Status: DC

## 2017-02-19 NOTE — Patient Instructions (Signed)
Medication Instructions:  START Kdur (Potassium supplement) 10 meq once daily   Labwork: TODAY - basic metabolic panel  Your physician recommends that you return for lab work in: 2 weeks for basic metabolic panel   Testing/Procedures: None Ordered   Follow-Up: Your physician wants you to follow-up in: 6 months with Dr. Elease HashimotoNahser.  You will receive a reminder letter in the mail two months in advance. If you don't receive a letter, please call our office to schedule the follow-up appointment.   If you need a refill on your cardiac medications before your next appointment, please call your pharmacy.   Thank you for choosing [facility]! Eligha BridegroomMichelle Abb Gobert, RN [phone]

## 2017-02-19 NOTE — Progress Notes (Signed)
Cardiology Office Note   Date:  02/19/2017   ID:  Amber Suarez, Amber Suarez Apr 15, 1956, MRN [id number]  PCP:  Massie Maroon, FNP  Cardiologist:   Kristeen Miss, MD   Chief Complaint  Patient presents with  . Hypertension   Problem list 1. Bradycardia 2. Essential hypertension 3. History of coronary artery disease 4. Hyperlipidemia 5. Diabetes mellitus 6. Cerebral aneurism with CVA - 1997.    History of Present Illness:  Amber Suarez is a 61 y.o. female who is being seen today for the evaluation of bradycardia and HTN  at the request of Massie Maroon, FNP. Seen with daughter, Amber Suarez.   She has been seen by Dr. Herbie Baltimore in the past. She previously saw Dr. Clarene Duke. She had a cardiac catheterization in May, 2013 which revealed nonobstructive coronary artery disease with small vessel ectasia. 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  She has not had any recent chest pain  No syncope or presyncope  She was at her medical doctors office today for routine medical checkup. She was noted have a slow heart rate and hypertension.  BP  164/84 with HR of 54.  She received clonidine 0.2 mg orally at her medical doctors office and her blood pressure is much better.  HR has slowed further ( due to the clonidine )   February 19, 2017:  Amber Suarez is doing much better in the Aldactone  No CP or dyspnea    Past Medical History:  Diagnosis Date  . CHF (congestive heart failure) (HCC)   . Diabetes mellitus   . Heart murmur   . Hyperlipidemia   . Hypertension   . Myocardial infarction (HCC)   . Stroke [facility])     Past Surgical History:  Procedure Laterality Date  . ABDOMINAL HYSTERECTOMY    . angoiplasty    . LEFT HEART CATHETERIZATION WITH CORONARY ANGIOGRAM N/A 01/28/2012   Procedure: LEFT HEART CATHETERIZATION WITH CORONARY ANGIOGRAM;  Surgeon: Marykay Lex, MD;  Location: [facility] CATH LAB;  Service: Cardiovascular;  Laterality: N/A;  . repair of aneursym  1997     Current  Outpatient Prescriptions  Medication Sig Dispense Refill  . Blood Pressure Monitoring DEVI 1 each by Does not apply route daily. 1 Device 0  . buPROPion (WELLBUTRIN XL) 150 MG 24 hr tablet Take 1 tablet (150 mg total) by mouth daily. 30 tablet 5  . glimepiride (AMARYL) 2 MG tablet Take 1 tablet (2 mg total) by mouth daily before breakfast. 30 tablet 3  . isosorbide mononitrate (IMDUR) 30 MG 24 hr tablet TAKE 1 TABLET BY MOUTH DAILY 30 tablet 3  . lisinopril (PRINIVIL,ZESTRIL) 10 MG tablet Take 1 tablet (10 mg total) by mouth daily. 90 tablet 0  . meloxicam (MOBIC) 7.5 MG tablet Take 7.5 mg by mouth daily.    . metFORMIN (GLUCOPHAGE-XR) 500 MG 24 hr tablet Take 2 tablets (1,000 mg total) by mouth daily. 180 tablet 2  . nitroGLYCERIN (NITROSTAT) 0.4 MG SL tablet Place 0.4 mg under the tongue every 5 (five) minutes as needed. Call 911 if no improvement after 3rd tab     . ondansetron (ZOFRAN ODT) 4 MG disintegrating tablet Take 1 tablet (4 mg total) by mouth every 8 (eight) hours as needed for nausea or vomiting. 8 tablet 0  . pravastatin (PRAVACHOL) 40 MG tablet TAKE 1 TABLET BY MOUTH DAILY 30 tablet 3  . spironolactone (ALDACTONE) 25 MG tablet Take 1 tablet (25 mg total) by mouth daily. 90  tablet 3   No current facility-administered medications for this visit.     PAD Screen 01/22/2017  Previous PAD dx? No  Previous surgical procedure? No  Pain with walking? No  Feet/toe relief with dangling? No  Painful, non-healing ulcers? No  Extremities discolored? No      Allergies:   Morphine and related    Social History:  The patient  reports that she has been smoking Cigarettes.  She has a 40.00 pack-year smoking history. She has never used smokeless tobacco. She reports that she does not drink alcohol or use drugs.   Family History:  The patient's family history includes Diabetes in her mother; Hypertension in her father, mother, and sister.    ROS:  Please see the history of present  illness.    Review of Systems: Constitutional:  denies fever, chills, diaphoresis, appetite change and fatigue.  HEENT: denies photophobia, eye pain, redness, hearing loss, ear pain, congestion, sore throat, rhinorrhea, sneezing, neck pain, neck stiffness and tinnitus.  Respiratory: denies SOB, DOE, cough, chest tightness, and wheezing.  Cardiovascular: denies chest pain, palpitations and leg swelling.  Gastrointestinal: denies nausea, vomiting, abdominal pain, diarrhea, constipation, blood in stool.  Genitourinary: denies dysuria, urgency, frequency, hematuria, flank pain and difficulty urinating.  Musculoskeletal: denies  myalgias, back pain, joint swelling, arthralgias and gait problem.   Skin: denies pallor, rash and wound.  Neurological: denies dizziness, seizures, syncope, weakness, light-headedness, numbness and headaches.   Hematological: denies adenopathy, easy bruising, personal or family bleeding history.  Psychiatric/ Behavioral: denies suicidal ideation, mood changes, confusion, nervousness, sleep disturbance and agitation.       All other systems are reviewed and negative.    PHYSICAL EXAM: VS:  BP 136/74   Pulse 74   Ht 5\' 9"  (1.753 m)   Wt 148 lb 2 oz (67.2 kg)   SpO2 96%   BMI 21.87 kg/m  , BMI Body mass index is 21.87 kg/m. GEN: Well nourished, well developed, in no acute distress  HEENT: normal  Neck: no JVD, carotid bruits, or masses Cardiac: RRR; no murmurs, rubs, or gallops,no edema  Respiratory:  clear to auscultation bilaterally, normal work of breathing GI: soft, nontender, nondistended, + BS MS: no deformity or atrophy  Skin: warm and dry, no rash Neuro:  Strength and sensation are intact Psych: normal   EKG:  EKG is ordered today. The ekg ordered Jan 22, 2017  demonstrates  Marked sinus brady .   NS T wave abn.   Recent Labs: 01/16/2017: ALT 4; Hemoglobin 13.2; Platelets 139; TSH 1.15 01/29/2017: BUN 12; Creatinine, Ser 0.99; Potassium 2.9;  Sodium 140    Lipid Panel    Component Value Date/Time   CHOL 145 08/15/2016 0845   TRIG 94 08/15/2016 0845   HDL 31 (L) 08/15/2016 0845   CHOLHDL 4.7 08/15/2016 0845   VLDL 19 08/15/2016 0845   LDLCALC 95 08/15/2016 0845   LDLDIRECT 133 (H) 07/14/2012 1402      Wt Readings from Last 3 Encounters:  02/19/17 148 lb 2 oz (67.2 kg)  01/22/17 151 lb (68.5 kg)  01/22/17 151 lb (68.5 kg)      Other studies Reviewed: Additional studies/ records that were reviewed today include: . Review of the above records demonstrates:    ASSESSMENT AND PLAN:   1. Hypertension:  BP is much better on Spironolactone  Feeling much better.  Potassium is better but was still low Will recheck today . Add Kdur 10 meq a day Recheck in  2 weeks.  Will see her in 6 months   2. Sinus Bradycardia:  Resolved.    2. Coronary artery disease:  Has had a cath.   Had coronary ectasia.  No obstructive CAD No change of meds.   Current medicines are reviewed at length with the patient today.  The patient does not have concerns regarding medicines.  Labs/ tests ordered today include:  No orders of the defined types were placed in this encounter.   Disposition:   Office visit in 6 months     Kristeen Miss, MD  02/19/2017 11:41 AM    [facility] [address] [place], [place], [place]  [address] Phone: [phone]; Fax: [phone]

## 2017-02-20 LAB — BASIC METABOLIC PANEL
BUN/Creatinine Ratio: 13 (ref 12–28)
BUN: 15 mg/dL (ref 8–27)
CO2: 30 mmol/L — AB (ref 18–29)
CREATININE: 1.13 mg/dL — AB (ref 0.57–1.00)
Calcium: 9.3 mg/dL (ref 8.7–10.3)
Chloride: 100 mmol/L (ref 96–106)
GFR calc Af Amer: 60 mL/min/{1.73_m2} (ref >59)
GFR, EST NON AFRICAN AMERICAN: 52 mL/min/{1.73_m2} — AB (ref >59)
Glucose: 101 mg/dL — ABNORMAL HIGH (ref 65–99)
Potassium: 3.1 mmol/L — ABNORMAL LOW (ref 3.5–5.2)
SODIUM: 144 mmol/L (ref 134–144)

## 2017-02-25 ENCOUNTER — Encounter: Payer: Self-pay | Admitting: Family Medicine

## 2017-02-25 ENCOUNTER — Ambulatory Visit (INDEPENDENT_AMBULATORY_CARE_PROVIDER_SITE_OTHER): Payer: No Typology Code available for payment source | Admitting: Family Medicine

## 2017-02-25 VITALS — BP 148/82 | HR 51 | Temp 98.0°F | Resp 16 | Ht 69.0 in | Wt 146.0 lb

## 2017-02-25 DIAGNOSIS — I10 Essential (primary) hypertension: Secondary | ICD-10-CM

## 2017-02-25 DIAGNOSIS — R9431 Abnormal electrocardiogram [ECG] [EKG]: Secondary | ICD-10-CM

## 2017-02-25 LAB — POCT URINALYSIS DIP (DEVICE)
Bilirubin Urine: NEGATIVE
GLUCOSE, UA: NEGATIVE mg/dL
Ketones, ur: NEGATIVE mg/dL
Leukocytes, UA: NEGATIVE
Nitrite: NEGATIVE
Specific Gravity, Urine: 1.025 (ref 1.005–1.030)
UROBILINOGEN UA: 2 mg/dL — AB (ref 0.0–1.0)
pH: 7 (ref 5.0–8.0)

## 2017-02-25 MED ORDER — CLONIDINE HCL 0.1 MG PO TABS
0.2000 mg | ORAL_TABLET | Freq: Once | ORAL | Status: AC
  Administered 2017-02-25: 0.2 mg via ORAL

## 2017-02-25 MED ORDER — CLONIDINE HCL 0.1 MG/24HR TD PTWK: 0.1000 mg | MEDICATED_PATCH | TRANSDERMAL | 12 refills | Status: DC

## 2017-02-25 MED ORDER — BLOOD PRESSURE MONITORING DEVI: 1.0000 | Freq: Every day | 0 refills | Status: AC

## 2017-02-25 NOTE — Patient Instructions (Addendum)
Clonidine 0.1 mg patch weekly.   Return in 1 week for a blood pressure check.   Maintain blood pressure diary.   Resume all previously prescribed medications.    DASH Eating Plan DASH stands for "Dietary Approaches to Stop Hypertension." The DASH eating plan is a healthy eating plan that has been shown to reduce high blood pressure (hypertension). It may also reduce your risk for type 2 diabetes, heart disease, and stroke. The DASH eating plan may also help with weight loss. What are tips for following this plan? General guidelines  Avoid eating more than 2,300 mg (milligrams) of salt (sodium) a day. If you have hypertension, you may need to reduce your sodium intake to 1,500 mg a day.  Limit alcohol intake to no more than 1 drink a day for nonpregnant women and 2 drinks a day for men. One drink equals 12 oz of beer, 5 oz of wine, or 1 oz of hard liquor.  Work with your health care provider to maintain a healthy body weight or to lose weight. Ask what an ideal weight is for you.  Get at least 30 minutes of exercise that causes your heart to beat faster (aerobic exercise) most days of the week. Activities may include walking, swimming, or biking.  Work with your health care provider or diet and nutrition specialist (dietitian) to adjust your eating plan to your individual calorie needs. Reading food labels  Check food labels for the amount of sodium per serving. Choose foods with less than 5 percent of the Daily Value of sodium. Generally, foods with less than 300 mg of sodium per serving fit into this eating plan.  To find whole grains, look for the word "whole" as the first word in the ingredient list. Shopping  Buy products labeled as "low-sodium" or "no salt added."  Buy fresh foods. Avoid canned foods and premade or frozen meals. Cooking  Avoid adding salt when cooking. Use salt-free seasonings or herbs instead of table salt or sea salt. Check with your health care provider or  pharmacist before using salt substitutes.  Do not fry foods. Cook foods using healthy methods such as baking, boiling, grilling, and broiling instead.  Cook with heart-healthy oils, such as olive, canola, soybean, or sunflower oil. Meal planning   Eat a balanced diet that includes: ? 5 or more servings of fruits and vegetables each day. At each meal, try to fill half of your plate with fruits and vegetables. ? Up to 6-8 servings of whole grains each day. ? Less than 6 oz of lean meat, poultry, or fish each day. A 3-oz serving of meat is about the same size as a deck of cards. One egg equals 1 oz. ? 2 servings of low-fat dairy each day. ? A serving of nuts, seeds, or beans 5 times each week. ? Heart-healthy fats. Healthy fats called Omega-3 fatty acids are found in foods such as flaxseeds and coldwater fish, like sardines, salmon, and mackerel.  Limit how much you eat of the following: ? Canned or prepackaged foods. ? Food that is high in trans fat, such as fried foods. ? Food that is high in saturated fat, such as fatty meat. ? Sweets, desserts, sugary drinks, and other foods with added sugar. ? Full-fat dairy products.  Do not salt foods before eating.  Try to eat at least 2 vegetarian meals each week.  Eat more home-cooked food and less restaurant, buffet, and fast food.  When eating at a restaurant, ask  that your food be prepared with less salt or no salt, if possible. What foods are recommended? The items listed may not be a complete list. Talk with your dietitian about what dietary choices are best for you. Grains Whole-grain or whole-wheat bread. Whole-grain or whole-wheat pasta. Brown rice. [place]row. Bulgur. Whole-grain and low-sodium cereals. Pita bread. Low-fat, low-sodium crackers. Whole-wheat flour tortillas. Vegetables Fresh or frozen vegetables (raw, steamed, roasted, or grilled). Low-sodium or reduced-sodium tomato and vegetable juice. Low-sodium or  reduced-sodium tomato sauce and tomato paste. Low-sodium or reduced-sodium canned vegetables. Fruits All fresh, dried, or frozen fruit. Canned fruit in natural juice (without added sugar). Meat and other protein foods Skinless chicken or [place]. Ground chicken or [place]. Pork with fat trimmed off. Fish and seafood. Egg whites. Dried beans, peas, or lentils. Unsalted nuts, nut butters, and seeds. Unsalted canned beans. Lean cuts of beef with fat trimmed off. Low-sodium, lean deli meat. Dairy Low-fat (1%) or fat-free (skim) milk. Fat-free, low-fat, or reduced-fat cheeses. Nonfat, low-sodium ricotta or cottage cheese. Low-fat or nonfat yogurt. Low-fat, low-sodium cheese. Fats and oils Soft margarine without trans fats. Vegetable oil. Low-fat, reduced-fat, or light mayonnaise and salad dressings (reduced-sodium). Canola, safflower, olive, soybean, and sunflower oils. Avocado. Seasoning and other foods Herbs. Spices. Seasoning mixes without salt. Unsalted popcorn and pretzels. Fat-free sweets. What foods are not recommended? The items listed may not be a complete list. Talk with your dietitian about what dietary choices are best for you. Grains Baked goods made with fat, such as croissants, muffins, or some breads. Dry pasta or rice meal packs. Vegetables Creamed or fried vegetables. Vegetables in a cheese sauce. Regular canned vegetables (not low-sodium or reduced-sodium). Regular canned tomato sauce and paste (not low-sodium or reduced-sodium). Regular tomato and vegetable juice (not low-sodium or reduced-sodium). Angie Fava. Olives. Fruits Canned fruit in a light or heavy syrup. Fried fruit. Fruit in cream or butter sauce. Meat and other protein foods Fatty cuts of meat. Ribs. Fried meat. Berniece Salines. Sausage. Bologna and other processed lunch meats. Salami. Fatback. Hotdogs. Bratwurst. Salted nuts and seeds. Canned beans with added salt. Canned or smoked fish. Whole eggs or egg yolks. Chicken or [place]  with skin. Dairy Whole or 2% milk, cream, and half-and-half. Whole or full-fat cream cheese. Whole-fat or sweetened yogurt. Full-fat cheese. Nondairy creamers. Whipped toppings. Processed cheese and cheese spreads. Fats and oils Butter. Stick margarine. Lard. Shortening. Ghee. Bacon fat. Tropical oils, such as coconut, palm kernel, or palm oil. Seasoning and other foods Salted popcorn and pretzels. Onion salt, garlic salt, seasoned salt, table salt, and sea salt. Worcestershire sauce. Tartar sauce. Barbecue sauce. Teriyaki sauce. Soy sauce, including reduced-sodium. Steak sauce. Canned and packaged gravies. Fish sauce. Oyster sauce. Cocktail sauce. Horseradish that you find on the shelf. Ketchup. Mustard. Meat flavorings and tenderizers. Bouillon cubes. Hot sauce and Tabasco sauce. Premade or packaged marinades. Premade or packaged taco seasonings. Relishes. Regular salad dressings. Where to find more information:  National Heart, Lung, and [place]: https://wilson-eaton.com/  American Heart Association: www.heart.org Summary  The DASH eating plan is a healthy eating plan that has been shown to reduce high blood pressure (hypertension). It may also reduce your risk for type 2 diabetes, heart disease, and stroke.  With the DASH eating plan, you should limit salt (sodium) intake to 2,300 mg a day. If you have hypertension, you may need to reduce your sodium intake to 1,500 mg a day.  When on the DASH eating plan, aim to eat more fresh fruits  and vegetables, whole grains, lean proteins, low-fat dairy, and heart-healthy fats.  Work with your health care provider or diet and nutrition specialist (dietitian) to adjust your eating plan to your individual calorie needs. This information is not intended to replace advice given to you by your health care provider. Make sure you discuss any questions you have with your health care provider. Document Released: 08/21/2011 Document Revised: 08/25/2016  Document Reviewed: 08/25/2016 Elsevier Interactive Patient Education  2017 [employer].  How to Take Your Blood Pressure You can take your blood pressure at home with a machine. You may need to check your blood pressure at home:  To check if you have high blood pressure (hypertension).  To check your blood pressure over time.  To make sure your blood pressure medicine is working.  Supplies needed: You will need a blood pressure machine, or monitor. You can buy one at a drugstore or online. When choosing one:  Choose one with an arm cuff.  Choose one that wraps around your upper arm. Only one finger should fit between your arm and the cuff.  Do not choose one that measures your blood pressure from your wrist or finger.  Your doctor can suggest a monitor. How to prepare Avoid these things for 30 minutes before checking your blood pressure:  Drinking caffeine.  Drinking alcohol.  Eating.  Smoking.  Exercising.  Five minutes before checking your blood pressure:  Pee.  Sit in a dining chair. Avoid sitting in a soft couch or armchair.  Be quiet. Do not talk.  How to take your blood pressure Follow the instructions that came with your machine. If you have a digital blood pressure monitor, these may be the instructions: 1. Sit up straight. 2. Place your feet on the floor. Do not cross your ankles or legs. 3. Rest your left arm at the level of your heart. You may rest it on a table, desk, or chair. 4. Pull up your shirt sleeve. 5. Wrap the blood pressure cuff around the upper part of your left arm. The cuff should be 1 inch (2.5 cm) above your elbow. It is best to wrap the cuff around bare skin. 6. Fit the cuff snugly around your arm. You should be able to place only one finger between the cuff and your arm. 7. Put the cord inside the groove of your elbow. 8. Press the power button. 9. Sit quietly while the cuff fills with air and loses air. 10. Write down the numbers  on the screen. 11. Wait 2-3 minutes and then repeat steps 1-10.  What do the numbers mean? Two numbers make up your blood pressure. The first number is called systolic pressure. The second is called diastolic pressure. An example of a blood pressure reading is "120 over 80" (or 120/80). If you are an adult and do not have a medical condition, use this guide to find out if your blood pressure is normal: Normal  First number: below 120.  Second number: below 80. Elevated  First number: 120-129.  Second number: below 80. Hypertension stage 1  First number: 130-139.  Second number: 80-89. Hypertension stage 2  First number: 140 or above.  Second number: 90 or above. Your blood pressure is above normal even if only the top or bottom number is above normal. Follow these instructions at home:  Check your blood pressure as often as your doctor tells you to.  Take your monitor to your next doctor's appointment. Your doctor will: ?  Make sure you are using it correctly. ? Make sure it is working right.  Make sure you understand what your blood pressure numbers should be.  Tell your doctor if your medicines are causing side effects. Contact a doctor if:  Your blood pressure keeps being high. Get help right away if:  Your first blood pressure number is higher than 180.  Your second blood pressure number is higher than 120. This information is not intended to replace advice given to you by your health care provider. Make sure you discuss any questions you have with your health care provider. Document Released: 08/14/2008 Document Revised: 07/30/2016 Document Reviewed: 02/08/2016 Elsevier Interactive Patient Education  2018 Elsevier Inc. Microalbumin Test Why am I having this test? Albumin is a protein in your body that helps regulate how much water is in your blood. As your kidneys filter your blood to get rid of waste products through your urine, albumin should remain in your  bloodstream. However, certain types of kidney disease can cause albumin to move from damaged blood vessels inside your kidneys and into your urine. When this happens, small amounts of albumin (microalbumin, MA) can be detected in your urine. This type of kidney damage is a common complication of diabetes mellitus, especially when blood sugar (glucose) has not been well controlled. The MA test may also help your health care provider diagnose other related medical conditions such as cardiovascular disease and high blood pressure. The MA test is often performed along with calculating urine creatinine levels. Creatinine is another waste product filtered out of your blood by your kidneys. You may have this test if:  You have diabetes and are showing signs of kidney damage.  Your health care provider wants to determine how well your blood glucose has been controlled over many years.  Your health care provider wants to determine your kidney function when other related tests are normal.  What kind of sample is taken? A urine sample is collected in a sterile container given to you by the lab. What are the reference values? Reference valuesare considered healthy valuesestablished after testing a large group of healthy people. Reference values may vary among different people, labs, and hospitals. It is your responsibility to obtain your test results. Ask the lab or department performing the test when and how you will get your results. A reference value for MA is any value less than 2 mg/L. A reference value for MA compared to creatinine is:  Men: less than 17 mg/g creatinine.  Women: less than 25 mg/g creatinine.  What do the results mean? MA test results that are higher than the reference values may indicate many health conditions. These may include:  Diabetes mellitus.  Poorly controlled diabetes mellitus.  Myoglobulinuria.  Hemoglobinuria.  Bence-Jones proteinuria.  Use of medicines or  drugs that are damaging to the kidneys.  Atherosclerosis.  Blood fat (lipid) abnormalities.  Insulin resistance.  High blood pressure.  Heart attack.  Talk with your health care provider to discuss your results, treatment options, and if necessary, the need for more tests. Talk with your health care provider if you have any questions about your results. Talk with your health care provider to discuss your results, treatment options, and if necessary, the need for more tests. Talk with your health care provider if you have any questions about your results. This information is not intended to replace advice given to you by your health care provider. Make sure you discuss any questions you have with your  health care provider. Document Released: 10/04/2004 Document Revised: 05/07/2016 Document Reviewed: 01/20/2014 Elsevier Interactive Patient Education  2018 [employer]

## 2017-02-27 ENCOUNTER — Other Ambulatory Visit: Payer: Self-pay | Admitting: Family Medicine

## 2017-02-27 ENCOUNTER — Ambulatory Visit: Payer: No Typology Code available for payment source | Admitting: Family Medicine

## 2017-02-27 VITALS — BP 162/110 | HR 49

## 2017-02-27 DIAGNOSIS — I10 Essential (primary) hypertension: Secondary | ICD-10-CM

## 2017-02-27 DIAGNOSIS — Z0130 Encounter for examination of blood pressure without abnormal findings: Secondary | ICD-10-CM

## 2017-02-27 MED ORDER — LISINOPRIL 20 MG PO TABS: 20.0000 mg | ORAL_TABLET | Freq: Every day | ORAL | 5 refills | Status: DC

## 2017-02-27 NOTE — Progress Notes (Signed)
[name] presented for a blood pressure check. Blood pressure is increased. Will not start clonidine patch due to bradycardia. Will increase Lisinopril to 20 mg daily. [name] is to schedule a follow up with cardiology.    The patient was given clear instructions to go to ER or return to medical center if symptoms do not improve, worsen or new problems develop. The patient verbalized understanding.     Nolon NationsLaChina Moore Anahit Klumb  MSN, FNP-C [facility] [address]  [place], [place] [id number] [phone]

## 2017-02-27 NOTE — Progress Notes (Signed)
Subjective:    Patient ID: Amber Suarez, female    DOB: 01/07/1956, 61 y.o.   MRN: [id number]  Amber Suarez, a 61 year old female with a history of hypertension, type 2 diabetes mellitus, and MI presents for a follow up of accelerated hypertension. Blood pressure is markedly elevated on arrival. She is on several antihypertensive medications and was evaluated by cardiology. Blood pressure normalized and bradycardia improved per cardiology. She denies dizziness, chest pain, neck pain, nausea, vomiting, or diarrhea.      Hypertension  This is a chronic problem. The problem is uncontrolled. Pertinent negatives include no anxiety, malaise/fatigue, orthopnea, palpitations, peripheral edema, PND or shortness of breath. Risk factors for coronary artery disease include diabetes mellitus, dyslipidemia, post-menopausal state, sedentary lifestyle and smoking/tobacco exposure. Past treatments include ACE inhibitors, beta blockers, calcium channel blockers and diuretics. Compliance problems include diet.  Hypertensive end-organ damage includes angina. There is no history of chronic renal disease, coarctation of the aorta, hyperaldosteronism, hypercortisolism, hyperparathyroidism, a hypertension causing med, pheochromocytoma, renovascular disease, sleep apnea or a thyroid problem.    Past Medical History:  Diagnosis Date  . CHF (congestive heart failure) (HCC)   . Diabetes mellitus   . Heart murmur   . Hyperlipidemia   . Hypertension   . Myocardial infarction (HCC)   . Stroke [facility](HCC)    Immunization History  Administered Date(s) Administered  . Influenza Split 06/26/2011, 07/14/2012  . Influenza Whole 06/15/2008  . Pneumococcal Polysaccharide-23 10/27/2013  . Td 01/18/2004  . Tdap 01/16/2017   Allergies  Allergen Reactions  . Morphine And Related Itching and Other (See Comments)    burning    Review of Systems  Constitutional: Positive for unexpected weight change (weight loss).  Negative for malaise/fatigue.  HENT: Negative.   Eyes: Negative.  Negative for photophobia and visual disturbance.  Respiratory: Negative.  Negative for shortness of breath.   Cardiovascular: Negative.  Negative for palpitations, orthopnea, leg swelling and PND.  Gastrointestinal: Negative.   Endocrine: Negative.   Genitourinary: Negative.   Musculoskeletal: Negative.   Skin: Negative.   Allergic/Immunologic: Negative.   Hematological: Negative.   Psychiatric/Behavioral: Negative.        Depression       Objective:   Physical Exam  Constitutional: She is oriented to person, place, and time. She appears well-developed and well-nourished.  HENT:  Head: Normocephalic and atraumatic.  Right Ear: External ear normal.  Left Ear: External ear normal.  Nose: Nose normal.  Mouth/Throat: Oropharynx is clear and moist.  Eyes: Conjunctivae and EOM are normal. Pupils are equal, round, and reactive to light.  Neck: Normal range of motion. Neck supple.  Cardiovascular: Regular rhythm, normal heart sounds, intact distal pulses and normal pulses.  Bradycardia present.   Pulmonary/Chest: Effort normal and breath sounds normal.  Abdominal: Soft. Bowel sounds are normal.  Musculoskeletal: Normal range of motion.  Neurological: She is alert and oriented to person, place, and time. She has normal reflexes.  Skin: Skin is warm and dry.  Psychiatric: She has a normal mood and affect. Her behavior is normal. Judgment and thought content normal.      BP (!) 148/82 Comment: manually  Pulse (!) 51   Temp 98 F (36.7 C) (Oral)   Resp 16   Ht 5\' 9"  (1.753 m)   Wt 146 lb (66.2 kg)   SpO2 97%   BMI 21.56 kg/m  Assessment & Plan:  1. Accelerated hypertension Blood pressure was markedly elevated on arrival. Blood  pressure was 202/110 manually. Patient administered Clonidine 0.1 mg. BP decreased after 30 minutes.   height is 5\' 9"  (1.753 m) and weight is 146 lb (66.2 kg). Her oral temperature is 98 F  (36.7 C). Her blood pressure is 148/82 (abnormal) and her pulse is 51 (abnormal). Her respiration is 16 and oxygen saturation is 97%.   Will apply a clonidine patch 0.1 mg patch weekly. She will return in 1 week for a bp check. She was advised to check blood pressures daily.  - cloNIDine (CATAPRES) tablet 0.2 mg; Take 2 tablets (0.2 mg total) by mouth once. 2. Abnormal finding on EKG - Blood Pressure Monitoring DEVI; 1 each by Does not apply route daily.  Dispense: 1 Device; Refill: 0  3. Accelerated hypertension - Blood Pressure Monitoring DEVI; 1 each by Does not apply route daily.  Dispense: 1 Device; Refill: 0  The patient was given clear instructions to go to ER or return to medical center if symptoms do not improve, worsen or new problems develop. The patient verbalized understanding. Will notify patient with laboratory results.  Nolon Nations  MSN, FNP-C [facility] Patient [facility] [address] [place], [place] [address] [phone]

## 2017-03-05 ENCOUNTER — Other Ambulatory Visit: Payer: No Typology Code available for payment source | Admitting: *Deleted

## 2017-03-05 DIAGNOSIS — I200 Unstable angina: Secondary | ICD-10-CM

## 2017-03-05 DIAGNOSIS — I10 Essential (primary) hypertension: Secondary | ICD-10-CM

## 2017-03-05 LAB — BASIC METABOLIC PANEL
BUN/Creatinine Ratio: 10 — ABNORMAL LOW (ref 12–28)
BUN: 10 mg/dL (ref 8–27)
CALCIUM: 9.3 mg/dL (ref 8.7–10.3)
CO2: 25 mmol/L (ref 20–29)
Chloride: 98 mmol/L (ref 96–106)
Creatinine, Ser: 0.97 mg/dL (ref 0.57–1.00)
GFR calc Af Amer: 72 mL/min/{1.73_m2} (ref >59)
GFR, EST NON AFRICAN AMERICAN: 63 mL/min/{1.73_m2} (ref >59)
Glucose: 102 mg/dL — ABNORMAL HIGH (ref 65–99)
POTASSIUM: 3.3 mmol/L — AB (ref 3.5–5.2)
Sodium: 139 mmol/L (ref 134–144)

## 2017-03-05 NOTE — Addendum Note (Signed)
Addended by: Tonita PhoenixBOWDEN, ROBIN K on: 03/05/2017 09:50 AM   Modules accepted: Orders

## 2017-03-10 ENCOUNTER — Telehealth: Payer: Self-pay | Admitting: *Deleted

## 2017-03-10 NOTE — Telephone Encounter (Signed)
-----   Message from Philip J Nahser, MD sent at 03/10/2017  3:17 PM EDT ----- Potassium is still low Increase kdur from 10 meq to 20 meq a day  Recheck BMP in 2 weeks   

## 2017-03-11 ENCOUNTER — Other Ambulatory Visit: Payer: Self-pay | Admitting: Family Medicine

## 2017-03-12 ENCOUNTER — Telehealth: Payer: Self-pay | Admitting: *Deleted

## 2017-03-12 DIAGNOSIS — E876 Hypokalemia: Secondary | ICD-10-CM | POA: Insufficient documentation

## 2017-03-12 MED ORDER — POTASSIUM CHLORIDE CRYS ER 20 MEQ PO TBCR: 20.0000 meq | EXTENDED_RELEASE_TABLET | Freq: Every day | ORAL | 3 refills | Status: DC

## 2017-03-12 NOTE — Telephone Encounter (Signed)
Notified the pt that per Dr Elease HashimotoNahser, her K is still low and he recommends that we increase her KDUR to 20 mEq po daily, and recheck a BMET in 2 weeks.  Pt requested further refills to be sent to her confirmed pharmacy of choice.  Scheduled the pt a lab appt to recheck a bmet on 03/26/17.  Pt verbalized understanding and agrees with this plan.

## 2017-03-12 NOTE — Telephone Encounter (Signed)
-----   Message from Vesta MixerPhilip J Nahser, MD sent at 03/10/2017  3:17 PM EDT ----- Potassium is still low Increase kdur from 10 meq to 20 meq a day  Recheck BMP in 2 weeks

## 2017-03-13 NOTE — Telephone Encounter (Signed)
Patient informed by Aggie HackerIvy Martin on 03-12-17.

## 2017-03-26 ENCOUNTER — Other Ambulatory Visit: Payer: No Typology Code available for payment source | Admitting: *Deleted

## 2017-03-26 DIAGNOSIS — E876 Hypokalemia: Secondary | ICD-10-CM

## 2017-03-26 LAB — BASIC METABOLIC PANEL
BUN/Creatinine Ratio: 9 — ABNORMAL LOW (ref 12–28)
BUN: 9 mg/dL (ref 8–27)
CALCIUM: 9.4 mg/dL (ref 8.7–10.3)
CO2: 26 mmol/L (ref 20–29)
CREATININE: 0.99 mg/dL (ref 0.57–1.00)
Chloride: 100 mmol/L (ref 96–106)
GFR, EST AFRICAN AMERICAN: 71 mL/min/{1.73_m2} (ref >59)
GFR, EST NON AFRICAN AMERICAN: 61 mL/min/{1.73_m2} (ref >59)
Glucose: 150 mg/dL — ABNORMAL HIGH (ref 65–99)
Potassium: 2.9 mmol/L — ABNORMAL LOW (ref 3.5–5.2)
Sodium: 143 mmol/L (ref 134–144)

## 2017-03-27 ENCOUNTER — Telehealth: Payer: Self-pay | Admitting: *Deleted

## 2017-03-27 DIAGNOSIS — Z79899 Other long term (current) drug therapy: Secondary | ICD-10-CM

## 2017-03-27 DIAGNOSIS — E876 Hypokalemia: Secondary | ICD-10-CM

## 2017-03-27 MED ORDER — POTASSIUM CHLORIDE CRYS ER 20 MEQ PO TBCR: 20.0000 meq | EXTENDED_RELEASE_TABLET | Freq: Two times a day (BID) | ORAL | 3 refills | Status: DC

## 2017-03-27 NOTE — Telephone Encounter (Signed)
K+  2.9 on lab from 03/26/17.  LM for pt yo c/b to make sure she is taking K+ supp.  Labs reviewed with Tereso NewcomerScott Weaver, PA.  If pt is taking 20 MEQ increase to BID and repeat in 1 week, if not taking she needs to take it as ordered and repeat in 1 week.

## 2017-03-27 NOTE — Telephone Encounter (Signed)
Pt reports she has been taking the potassium supplement as ordered (20 MeQ) daily.  Orders given to increase to BID per Tereso NewcomerScott Weaver, PA and repeat lab in one week.  rx sent into pharmacy requested.  Lab order placed for 1 week.

## 2017-04-02 ENCOUNTER — Encounter: Payer: Self-pay | Admitting: Family Medicine

## 2017-04-02 ENCOUNTER — Ambulatory Visit (INDEPENDENT_AMBULATORY_CARE_PROVIDER_SITE_OTHER): Payer: No Typology Code available for payment source | Admitting: Family Medicine

## 2017-04-02 ENCOUNTER — Telehealth: Payer: Self-pay | Admitting: Family Medicine

## 2017-04-02 VITALS — BP 180/88 | HR 51 | Temp 98.0°F | Ht 69.0 in | Wt 146.0 lb

## 2017-04-02 DIAGNOSIS — E119 Type 2 diabetes mellitus without complications: Secondary | ICD-10-CM

## 2017-04-02 DIAGNOSIS — E269 Hyperaldosteronism, unspecified: Secondary | ICD-10-CM

## 2017-04-02 DIAGNOSIS — I152 Hypertension secondary to endocrine disorders: Secondary | ICD-10-CM

## 2017-04-02 DIAGNOSIS — F17200 Nicotine dependence, unspecified, uncomplicated: Secondary | ICD-10-CM

## 2017-04-02 LAB — POCT URINALYSIS DIP (DEVICE)
BILIRUBIN URINE: NEGATIVE
Glucose, UA: NEGATIVE mg/dL
Hgb urine dipstick: NEGATIVE
Ketones, ur: NEGATIVE mg/dL
LEUKOCYTES UA: NEGATIVE
NITRITE: NEGATIVE
PH: 6 (ref 5.0–8.0)
Protein, ur: 100 mg/dL — AB
Specific Gravity, Urine: >=1.03 (ref 1.005–1.030)
UROBILINOGEN UA: 1 mg/dL (ref 0.0–1.0)

## 2017-04-02 LAB — POCT GLYCOSYLATED HEMOGLOBIN (HGB A1C): HEMOGLOBIN A1C: 6.3

## 2017-04-02 MED ORDER — CLONIDINE HCL 0.1 MG PO TABS: 0.2000 mg | ORAL_TABLET | Freq: Once | ORAL | Status: DC

## 2017-04-02 NOTE — Telephone Encounter (Signed)
A note has been put in the referral tab and sent to [facility]. Thanks!

## 2017-04-02 NOTE — Patient Instructions (Addendum)
Will send a referral to endocrinology for further workup and evaluation. Will call to schedule urgent appoiintment.  Continue to take blood pressure medications as prescribed  Follow up with cardiology in am as scheduled.

## 2017-04-02 NOTE — Progress Notes (Signed)
Subjective:    Patient ID: Amber Suarez, female    DOB: [birth year], 61 y.o.   MRN: [id number]  Ms. Fritzie Prioleau, a 61 year old female with a history of hypertension, type 2 diabetes mellitus, and MI presents for a follow up of accelerated hypertension. Blood pressure is markedly elevated on arrival. She is on several antihypertensive medications and was evaluated by cardiology. Blood pressure normalized and bradycardia improved per cardiology. She denies dizziness, chest pain, neck pain, nausea, vomiting, or diarrhea. Reviewed previous laboratory values and renin/aldosterone ratio is elevated. Also, Ms. Bergevin has had hypokalemia.  Suspect that marked blood pressure elevation is related to hyperaldosteronism.      Hypertension  This is a chronic problem. The problem is uncontrolled. Pertinent negatives include no anxiety, malaise/fatigue, orthopnea, palpitations, peripheral edema, PND or shortness of breath. Risk factors for coronary artery disease include diabetes mellitus, dyslipidemia, post-menopausal state, sedentary lifestyle and smoking/tobacco exposure. Past treatments include ACE inhibitors, beta blockers, calcium channel blockers and diuretics. Compliance problems include diet.  Hypertensive end-organ damage includes angina. There is no history of chronic renal disease, coarctation of the aorta, hyperaldosteronism, hypercortisolism, hyperparathyroidism, a hypertension causing med, pheochromocytoma, renovascular disease, sleep apnea or a thyroid problem.  Diabetes  She has type 2 diabetes mellitus. Pertinent negatives for diabetes include no polydipsia, no polyphagia, no polyuria and no visual change. Risk factors for coronary artery disease include dyslipidemia, diabetes mellitus and tobacco exposure.    Past Medical History:  Diagnosis Date  . CHF (congestive heart failure) (HCC)   . Diabetes mellitus   . Heart murmur   . Hyperlipidemia   . Hypertension   . Myocardial  infarction (HCC)   . Stroke [facility])    Immunization History  Administered Date(s) Administered  . Influenza Split 06/26/2011, 07/14/2012  . Influenza Whole 06/15/2008  . Pneumococcal Polysaccharide-23 10/27/2013  . Td 01/18/2004  . Tdap 01/16/2017   Allergies  Allergen Reactions  . Morphine And Related Itching and Other (See Comments)    burning    Review of Systems  Constitutional: Positive for unexpected weight change (weight loss). Negative for malaise/fatigue.  HENT: Negative.   Eyes: Negative.  Negative for photophobia and visual disturbance.  Respiratory: Negative.  Negative for shortness of breath.   Cardiovascular: Negative.  Negative for palpitations, orthopnea, leg swelling and PND.  Gastrointestinal: Negative.   Endocrine: Negative.  Negative for polydipsia, polyphagia and polyuria.  Genitourinary: Negative.   Musculoskeletal: Negative.   Skin: Negative.   Allergic/Immunologic: Negative.   Hematological: Negative.   Psychiatric/Behavioral: Negative.        Depression       Objective:   Physical Exam  Constitutional: She is oriented to person, place, and time. She appears well-developed and well-nourished.  HENT:  Head: Normocephalic and atraumatic.  Right Ear: External ear normal.  Left Ear: External ear normal.  Nose: Nose normal.  Mouth/Throat: Oropharynx is clear and moist.  Eyes: Pupils are equal, round, and reactive to light. Conjunctivae and EOM are normal.  Neck: Normal range of motion. Neck supple.  Cardiovascular: Regular rhythm, normal heart sounds, intact distal pulses and normal pulses.  Bradycardia present.   Pulmonary/Chest: Effort normal and breath sounds normal.  Abdominal: Soft. Bowel sounds are normal.  Musculoskeletal: Normal range of motion.  Neurological: She is alert and oriented to person, place, and time. She has normal reflexes.  Skin: Skin is warm and dry.  Psychiatric: She has a normal mood and affect. Her behavior is normal.  Judgment and thought content normal.      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 Blood pressure was 202/110 manually. Patient administered Clonidine 0.1 mg. BP decreased after 30 minutes.   height is 5\' 9"  (1.753 m) and weight is 146 lb (66.2 kg). Her oral temperature is 98 F (36.7 C). Her blood pressure is 210/110 (abnormal) and her pulse is 51 (abnormal). Her oxygen saturation is 100%.   I will defer to endocrinology for further evaluation of condition. Ms. Tressie EllisMontague also has a follow up scheduled with cardiology.  - POCT urinalysis dip (device) - Ambulatory referral to Endocrinology - BASIC METABOLIC PANEL WITH GFR - cloNIDine (CATAPRES) tablet 0.2 mg; Take 2 tablets (0.2 mg total) by mouth once.  2. TOBACCO DEPENDENCE Smoking cessation instruction/counseling given:  counseled patient on the dangers of tobacco use, advised patient to stop smoking, and reviewed strategies to maximize success  3. Hyperaldosteronism (HCC) - Ambulatory referral to Endocrinology  4. Controlled type 2 diabetes mellitus without complication, without long-term current use of insulin (HCC) Hemoglobin a1C is at goal is is currently 6.3. Will continue medications as previously prescribed. Also, continue carbohydrate modified diet.  - HgB A1c   RTC: 1 month for chronic conditions   Nolon NationsLaChina Moore Paulanthony Gleaves  MSN, FNP-C [facility] Patient [facility] [address]  [place], [place] [id number] [phone]

## 2017-04-02 NOTE — Telephone Encounter (Signed)
Called patient and verified verbally that she has 100% [facility] discount. She will bring letter to Karren BurlyChandra so we can put in system and then her Endocrinology referral maybe [facility]. Vernona RiegerLaura, I have not done a referral. Thanks

## 2017-04-03 ENCOUNTER — Other Ambulatory Visit: Payer: No Typology Code available for payment source | Admitting: *Deleted

## 2017-04-03 DIAGNOSIS — Z79899 Other long term (current) drug therapy: Secondary | ICD-10-CM

## 2017-04-03 DIAGNOSIS — E876 Hypokalemia: Secondary | ICD-10-CM

## 2017-04-03 LAB — BASIC METABOLIC PANEL
BUN / CREAT RATIO: 12 (ref 12–28)
BUN: 13 mg/dL (ref 8–27)
CO2: 30 mmol/L — ABNORMAL HIGH (ref 20–29)
Calcium: 9.7 mg/dL (ref 8.7–10.3)
Chloride: 103 mmol/L (ref 96–106)
Creatinine, Ser: 1.1 mg/dL — ABNORMAL HIGH (ref 0.57–1.00)
GFR, EST AFRICAN AMERICAN: 62 mL/min/{1.73_m2} (ref >59)
GFR, EST NON AFRICAN AMERICAN: 54 mL/min/{1.73_m2} — AB (ref >59)
Glucose: 124 mg/dL — ABNORMAL HIGH (ref 65–99)
POTASSIUM: 3.5 mmol/L (ref 3.5–5.2)
SODIUM: 141 mmol/L (ref 134–144)

## 2017-04-03 LAB — BASIC METABOLIC PANEL WITH GFR
BUN: 12 mg/dL (ref 7–25)
CALCIUM: 9.3 mg/dL (ref 8.6–10.4)
CHLORIDE: 104 mmol/L (ref 98–110)
CO2: 21 mmol/L (ref 20–31)
CREATININE: 1.11 mg/dL — AB (ref 0.50–0.99)
GFR, EST NON AFRICAN AMERICAN: 53 mL/min — AB (ref >=60)
GFR, Est African American: 62 mL/min (ref >=60)
Glucose, Bld: 106 mg/dL — ABNORMAL HIGH (ref 65–99)
Potassium: 4 mmol/L (ref 3.5–5.3)
SODIUM: 143 mmol/L (ref 135–146)

## 2017-05-04 ENCOUNTER — Ambulatory Visit (INDEPENDENT_AMBULATORY_CARE_PROVIDER_SITE_OTHER): Payer: No Typology Code available for payment source | Admitting: Family Medicine

## 2017-05-04 ENCOUNTER — Encounter: Payer: Self-pay | Admitting: Family Medicine

## 2017-05-04 VITALS — BP 160/88 | HR 50 | Temp 98.9°F | Resp 14 | Ht 69.0 in | Wt 150.0 lb

## 2017-05-04 DIAGNOSIS — I152 Hypertension secondary to endocrine disorders: Secondary | ICD-10-CM

## 2017-05-04 MED ORDER — CLONIDINE HCL 0.1 MG PO TABS
0.2000 mg | ORAL_TABLET | Freq: Once | ORAL | Status: AC
  Administered 2017-05-04: 0.2 mg via ORAL

## 2017-05-07 NOTE — Progress Notes (Signed)
Subjective:    Patient ID: Amber Suarez, female    DOB: 06/29/1956, 61 y.o.   MRN: [id number]  Ms. Laiyah Exline, a 61 year old female with a history of hypertension, type 2 diabetes mellitus, and MI presents for a follow up of accelerated hypertension. Blood pressure is markedly elevated on arrival. She is on several antihypertensive medications and was evaluated by cardiology. Blood pressure normalized and bradycardia improved per cardiology. She denies dizziness, chest pain, neck pain, nausea, vomiting, or diarrhea. Reviewed previous laboratory values and renin/aldosterone ratio is elevated. Also, Ms. Wareing has had hypokalemia.  Suspect that marked blood pressure elevation is related to hyperaldosteronism. A referral was sent to endocrinology, patient did not answer phone to schedule appointment.      Hypertension  This is a chronic problem. The problem is uncontrolled. Pertinent negatives include no anxiety, malaise/fatigue, orthopnea, palpitations, peripheral edema, PND or shortness of breath. Risk factors for coronary artery disease include diabetes mellitus, dyslipidemia, post-menopausal state, sedentary lifestyle and smoking/tobacco exposure. Past treatments include ACE inhibitors, beta blockers, calcium channel blockers and diuretics. Compliance problems include diet.  Hypertensive end-organ damage includes angina. There is no history of chronic renal disease, coarctation of the aorta, hyperaldosteronism, hypercortisolism, hyperparathyroidism, a hypertension causing med, pheochromocytoma, renovascular disease, sleep apnea or a thyroid problem.    Past Medical History:  Diagnosis Date  . CHF (congestive heart failure) (HCC)   . Diabetes mellitus   . Heart murmur   . Hyperlipidemia   . Hypertension   . Myocardial infarction (HCC)   . Stroke [facility])    Immunization History  Administered Date(s) Administered  . Influenza Split 06/26/2011, 07/14/2012  . Influenza Whole  06/15/2008  . Pneumococcal Polysaccharide-23 10/27/2013  . Td 01/18/2004  . Tdap 01/16/2017   Allergies  Allergen Reactions  . Morphine And Related Itching and Other (See Comments)    burning    Review of Systems  Constitutional: Positive for unexpected weight change (weight loss). Negative for malaise/fatigue.  HENT: Negative.   Eyes: Negative.  Negative for photophobia and visual disturbance.  Respiratory: Negative.  Negative for shortness of breath.   Cardiovascular: Negative.  Negative for palpitations, orthopnea, leg swelling and PND.  Gastrointestinal: Negative.   Endocrine: Negative.  Negative for polydipsia, polyphagia and polyuria.  Genitourinary: Negative.   Musculoskeletal: Negative.   Skin: Negative.   Allergic/Immunologic: Negative.   Hematological: Negative.   Psychiatric/Behavioral: Negative.        Depression       Objective:   Physical Exam  Constitutional: She is oriented to person, place, and time. She appears well-developed and well-nourished.  HENT:  Head: Normocephalic and atraumatic.  Right Ear: External ear normal.  Left Ear: External ear normal.  Nose: Nose normal.  Mouth/Throat: Oropharynx is clear and moist.  Eyes: Pupils are equal, round, and reactive to light. Conjunctivae and EOM are normal.  Neck: Normal range of motion. Neck supple.  Cardiovascular: Regular rhythm, normal heart sounds, intact distal pulses and normal pulses.  Bradycardia present.   Pulmonary/Chest: Effort normal and breath sounds normal.  Abdominal: Soft. Bowel sounds are normal.  Musculoskeletal: Normal range of motion.  Neurological: She is alert and oriented to person, place, and time. She has normal reflexes.  Skin: Skin is warm and dry.  Psychiatric: She has a normal mood and affect. Her behavior is normal. Judgment and thought content normal.      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 Blood pressure was 212/110 manually. Patient administered Clonidine 0.3 mg. BP decreased after 30 minutes.   height is 5\' 9"  (1.753 m) and weight is 150 lb (68 kg). Her oral temperature is 98.9 F (37.2 C). Her blood pressure is 160/88 (abnormal) and her pulse is 50 (abnormal). Her respiration is 14 and oxygen saturation is 99%.   Called and scheduled follow up with endocrinology. Appointment is on 06/12/2017. I will defer to endocrinology for further evaluation of condition. Ms. Usery also has a follow up scheduled with cardiology.    2. TOBACCO DEPENDENCE Smoking cessation instruction/counseling given:  counseled patient on the dangers of tobacco use, advised patient to stop smoking, and reviewed strategies to maximize success  RTC: 3 months for chronic conditions   Nolon Nations  MSN, FNP-C [facility] Patient [facility] [address] [place], [place] [address] [phone]

## 2017-05-22 ENCOUNTER — Other Ambulatory Visit: Payer: Self-pay | Admitting: Family Medicine

## 2017-05-22 DIAGNOSIS — I10 Essential (primary) hypertension: Secondary | ICD-10-CM

## 2017-06-12 ENCOUNTER — Telehealth: Payer: Self-pay | Admitting: Endocrinology

## 2017-06-12 ENCOUNTER — Encounter: Payer: Self-pay | Admitting: Endocrinology

## 2017-06-12 ENCOUNTER — Ambulatory Visit (INDEPENDENT_AMBULATORY_CARE_PROVIDER_SITE_OTHER): Payer: No Typology Code available for payment source | Admitting: Endocrinology

## 2017-06-12 VITALS — BP 142/82 | HR 71 | Wt 154.4 lb

## 2017-06-12 DIAGNOSIS — E269 Hyperaldosteronism, unspecified: Secondary | ICD-10-CM

## 2017-06-12 DIAGNOSIS — E119 Type 2 diabetes mellitus without complications: Secondary | ICD-10-CM

## 2017-06-12 MED ORDER — SPIRONOLACTONE 50 MG PO TABS: 50.0000 mg | ORAL_TABLET | Freq: Every day | ORAL | 11 refills | Status: DC

## 2017-06-12 NOTE — Telephone Encounter (Signed)
Patient is scheduled and has been made aware. No further action required.

## 2017-06-12 NOTE — Telephone Encounter (Signed)
Looking for adrenal nodule.  please call patient:  We need more labs, for CT.  I have ordered.

## 2017-06-12 NOTE — Patient Instructions (Addendum)
Let's check a CT scan.  you will receive a phone call, about a day and time for an appointment. Please increase the spironolactone to 50 mg daily. Please come back for a follow-up appointment in 1 month.

## 2017-06-12 NOTE — Progress Notes (Addendum)
Subjective:    Patient ID: Amber Suarez, female    DOB: 09-13-1956, 61 y.o.   MRN: [id number]  HPI Pt is referred by Julianne Handler, NP, for hypokalemia.  Pt was first noted to have moderate hypokalemia at least as far back as 2008.    She has no h/o chemotx, special diet, steroids, adrenal disease, herbal supplements, or hypomagnesemia.  She has never had adrenal imaging.  She has little if any cramps in the legs, but no assoc edema.  She has been taking aldactone x 3 months.   Past Medical History:  Diagnosis Date  . CHF (congestive heart failure) (HCC)   . Diabetes mellitus   . Heart murmur   . Hyperlipidemia   . Hypertension   . Myocardial infarction (HCC)   . Stroke [facility])     Past Surgical History:  Procedure Laterality Date  . ABDOMINAL HYSTERECTOMY    . angoiplasty    . LEFT HEART CATHETERIZATION WITH CORONARY ANGIOGRAM N/A 01/28/2012   Procedure: LEFT HEART CATHETERIZATION WITH CORONARY ANGIOGRAM;  Surgeon: Marykay Lex, MD;  Location: [facility] CATH LAB;  Service: Cardiovascular;  Laterality: N/A;  . repair of aneursym  1997    Social History   Social History  . Marital status: Divorced    Spouse name: N/A  . Number of children: N/A  . Years of education: N/A   Occupational History  . Not on file.   Social History Main Topics  . Smoking status: Current Every Day Smoker    Packs/day: 0.50    Years: 40.00    Types: Cigarettes  . Smokeless tobacco: Never Used  . Alcohol use No  . Drug use: No  . Sexual activity: Not Currently    Birth control/ protection: Surgical   Other Topics Concern  . Not on file   Social History Narrative  . No narrative on file    Current Outpatient Prescriptions on File Prior to Visit  Medication Sig Dispense Refill  . Blood Pressure Monitoring DEVI 1 each by Does not apply route daily. 1 Device 0  . buPROPion (WELLBUTRIN XL) 150 MG 24 hr tablet Take 1 tablet (150 mg total) by mouth daily. 30 tablet 5  . glimepiride (AMARYL) 2  MG tablet Take 1 tablet (2 mg total) by mouth daily before breakfast. (Patient not taking: Reported on 05/04/2017) 30 tablet 3  . isosorbide mononitrate (IMDUR) 30 MG 24 hr tablet TAKE 1 TABLET BY MOUTH DAILY 30 tablet 3  . lisinopril (PRINIVIL,ZESTRIL) 10 MG tablet TAKE 1 TABLET (10 MG TOTAL) BY MOUTH DAILY. 90 tablet 0  . meloxicam (MOBIC) 7.5 MG tablet Take 7.5 mg by mouth daily.    . metFORMIN (GLUCOPHAGE-XR) 500 MG 24 hr tablet Take 2 tablets (1,000 mg total) by mouth daily. 180 tablet 2  . nitroGLYCERIN (NITROSTAT) 0.4 MG SL tablet Place 0.4 mg under the tongue every 5 (five) minutes as needed. Call 911 if no improvement after 3rd tab     . potassium chloride SA (K-DUR,KLOR-CON) 20 MEQ tablet Take 1 tablet (20 mEq total) by mouth 2 (two) times daily. 60 tablet 3  . pravastatin (PRAVACHOL) 40 MG tablet TAKE 1 TABLET BY MOUTH DAILY 30 tablet 3   No current facility-administered medications on file prior to visit.     Allergies  Allergen Reactions  . Morphine And Related Itching and Other (See Comments)    burning    Family History  Problem Relation Age of Onset  . Diabetes  Mother   . Hypertension Mother   . Hypertension Father   . Hypertension Sister     BP (!) 142/82   Pulse 71   Wt 154 lb 6.4 oz (70 kg)   SpO2 96%   BMI 22.80 kg/m    Review of Systems Pt denies weight change, blurry vision, sob, diarrhea, n/v, difficulty with concentration, muscle weakness, palpitations, heat intolerance, excessive diaphoresis, polyuria, allergy sxs, and paresthesias.       Objective:   Physical Exam VS: see vs page GEN: no distress HEAD: head: no deformity eyes: no periorbital swelling, no proptosis external nose and ears are normal mouth: no lesion seen NECK: supple, thyroid is not enlarged CHEST WALL: no deformity LUNGS: clear to auscultation CV: reg rate and rhythm, no murmur ABD: abdomen is soft, nontender.  no hepatosplenomegaly.  not distended.  no  hernia MUSCULOSKELETAL: muscle bulk and strength are grossly normal.  no obvious joint swelling.  gait is normal and steady EXTEMITIES: no deformity.  no edema PULSES: no carotid bruit NEURO:  cn 2-12 grossly intact.   readily moves all 4's.  sensation is intact to touch on all 4's SKIN:  Normal texture and temperature.  No rash or suspicious lesion is visible.   NODES:  None palpable at the neck PSYCH: alert, well-oriented.  Does not appear anxious nor depressed.   ALDOSTERONE 0.0 - 30.0 ng/dL 40.9   Comment: This test was developed and its performance characteristics  determined by [facility]. It has not been cleared or approved  by the Food and Drug Administration.   Renin 0.167 - 5.380 ng/mL/hr 0.175   Comment: This test was developed and its performance characteristics  determined by [facility]. It has not been cleared or approved  by the Food and Drug Administration.   ALDOS/RENIN RATIO 0.0 - 30.0 121.1    Comment:             Units:   ng/dL per ng/mL/hr   Lab Results  Component Value Date   CREATININE 1.10 (H) 04/03/2017   BUN 13 04/03/2017   NA 141 04/03/2017   K 3.5 04/03/2017   CL 103 04/03/2017   CO2 30 (H) 04/03/2017   I personally reviewed electrocardiogram tracing (01/23/17): Indication: HTN.  Impression: SB.  Diffuse inverted T-waves. No hypertrophy. Compared to 01/16/17: no significant change.      Assessment & Plan:  Elevated aldo/renin ratio, new to me.    Patient Instructions  Let's check a CT scan.  you will receive a phone call, about a day and time for an appointment. Please increase the spironolactone to 50 mg daily. Please come back for a follow-up appointment in 1 month.

## 2017-06-12 NOTE — Telephone Encounter (Signed)
Called and left patient very detailed VM that I needed her to call back to make lab appt before she could have CT done. I also stated that the orders were placed & she could call Stacy from [facility] CT with any questions. I also provided patient with directions to go to [address] Office 3rd floor to pick of bottle of contrast before scan.

## 2017-06-12 NOTE — Telephone Encounter (Signed)
Amber Suarez with CT called in reference to CT orders for patient. Amber Suarez stated she needs to know what exactly they are looking for on CT and she believes the order is in wrong. Amber Suarez also stated that lab orders need to be put in for patient. Please call Amber Suarez and advise.

## 2017-06-15 ENCOUNTER — Other Ambulatory Visit (INDEPENDENT_AMBULATORY_CARE_PROVIDER_SITE_OTHER): Payer: No Typology Code available for payment source

## 2017-06-15 DIAGNOSIS — E269 Hyperaldosteronism, unspecified: Secondary | ICD-10-CM

## 2017-06-15 LAB — BASIC METABOLIC PANEL
BUN: 10 mg/dL (ref 6–23)
CALCIUM: 9.7 mg/dL (ref 8.4–10.5)
CHLORIDE: 102 meq/L (ref 96–112)
CO2: 29 meq/L (ref 19–32)
CREATININE: 1.04 mg/dL (ref 0.40–1.20)
GFR: 68.87 mL/min (ref >60.00)
GLUCOSE: 119 mg/dL — AB (ref 70–99)
Potassium: 3.9 mEq/L (ref 3.5–5.1)
Sodium: 140 mEq/L (ref 135–145)

## 2017-06-18 ENCOUNTER — Inpatient Hospital Stay: Admission: RE | Admit: 2017-06-18 | Payer: No Typology Code available for payment source | Source: Ambulatory Visit

## 2017-06-22 ENCOUNTER — Other Ambulatory Visit: Payer: No Typology Code available for payment source

## 2017-06-24 ENCOUNTER — Ambulatory Visit (INDEPENDENT_AMBULATORY_CARE_PROVIDER_SITE_OTHER)
Admission: RE | Admit: 2017-06-24 | Discharge: 2017-06-24 | Disposition: A | Discharge status: Home / Self Care | Payer: No Typology Code available for payment source | Source: Ambulatory Visit | Attending: Endocrinology | Admitting: Endocrinology

## 2017-06-24 DIAGNOSIS — E269 Hyperaldosteronism, unspecified: Secondary | ICD-10-CM

## 2017-06-24 MED ORDER — IOPAMIDOL (ISOVUE-300) INJECTION 61%
80.0000 mL | Freq: Once | INTRAVENOUS | Status: AC | PRN
  Administered 2017-06-24: 80 mL via INTRAVENOUS

## 2017-07-14 ENCOUNTER — Encounter: Payer: Self-pay | Admitting: Endocrinology

## 2017-07-14 ENCOUNTER — Ambulatory Visit (INDEPENDENT_AMBULATORY_CARE_PROVIDER_SITE_OTHER): Payer: No Typology Code available for payment source | Admitting: Endocrinology

## 2017-07-14 VITALS — BP 132/80 | HR 62 | Wt 158.2 lb

## 2017-07-14 DIAGNOSIS — E119 Type 2 diabetes mellitus without complications: Secondary | ICD-10-CM

## 2017-07-14 DIAGNOSIS — E269 Hyperaldosteronism, unspecified: Secondary | ICD-10-CM

## 2017-07-14 LAB — POCT GLYCOSYLATED HEMOGLOBIN (HGB A1C): Hemoglobin A1C: 6.3

## 2017-07-14 NOTE — Progress Notes (Signed)
Subjective:    Patient ID: Amber Suarez, female    DOB: 01/10/1956, 61 y.o.   MRN: [id number]  HPI Pt returns for f/u of elevated aldo/renin ratio (she had hypokalemia at least as far back as 2008; CT in 2018 showed normal adrenals; she was started on aldactone in mid-2018).  pt states she feels well in general, except for fatigue.   Past Medical History:  Diagnosis Date  . CHF (congestive heart failure) (HCC)   . Diabetes mellitus   . Heart murmur   . Hyperlipidemia   . Hypertension   . Myocardial infarction (HCC)   . Stroke [facility])     Past Surgical History:  Procedure Laterality Date  . ABDOMINAL HYSTERECTOMY    . angoiplasty    . LEFT HEART CATHETERIZATION WITH CORONARY ANGIOGRAM N/A 01/28/2012   Procedure: LEFT HEART CATHETERIZATION WITH CORONARY ANGIOGRAM;  Surgeon: Marykay Lex, MD;  Location: [facility] CATH LAB;  Service: Cardiovascular;  Laterality: N/A;  . repair of aneursym  1997    Social History   Social History  . Marital status: Divorced    Spouse name: N/A  . Number of children: N/A  . Years of education: N/A   Occupational History  . Not on file.   Social History Main Topics  . Smoking status: Current Every Day Smoker    Packs/day: 0.50    Years: 40.00    Types: Cigarettes  . Smokeless tobacco: Never Used  . Alcohol use No  . Drug use: No  . Sexual activity: Not Currently    Birth control/ protection: Surgical   Other Topics Concern  . Not on file   Social History Narrative  . No narrative on file    Current Outpatient Prescriptions on File Prior to Visit  Medication Sig Dispense Refill  . Blood Pressure Monitoring DEVI 1 each by Does not apply route daily. 1 Device 0  . buPROPion (WELLBUTRIN XL) 150 MG 24 hr tablet Take 1 tablet (150 mg total) by mouth daily. 30 tablet 5  . isosorbide mononitrate (IMDUR) 30 MG 24 hr tablet TAKE 1 TABLET BY MOUTH DAILY 30 tablet 3  . lisinopril (PRINIVIL,ZESTRIL) 10 MG tablet TAKE 1 TABLET (10 MG TOTAL) BY  MOUTH DAILY. 90 tablet 0  . meloxicam (MOBIC) 7.5 MG tablet Take 7.5 mg by mouth daily.    . metFORMIN (GLUCOPHAGE-XR) 500 MG 24 hr tablet Take 2 tablets (1,000 mg total) by mouth daily. 180 tablet 2  . nitroGLYCERIN (NITROSTAT) 0.4 MG SL tablet Place 0.4 mg under the tongue every 5 (five) minutes as needed. Call 911 if no improvement after 3rd tab     . potassium chloride SA (K-DUR,KLOR-CON) 20 MEQ tablet Take 1 tablet (20 mEq total) by mouth 2 (two) times daily. 60 tablet 3  . pravastatin (PRAVACHOL) 40 MG tablet TAKE 1 TABLET BY MOUTH DAILY 30 tablet 3  . spironolactone (ALDACTONE) 50 MG tablet Take 1 tablet (50 mg total) by mouth daily. 30 tablet 11   No current facility-administered medications on file prior to visit.     Allergies  Allergen Reactions  . Morphine And Related Itching and Other (See Comments)    burning    Family History  Problem Relation Age of Onset  . Diabetes Mother   . Hypertension Mother   . Hypertension Father   . Hypertension Sister     BP 132/80   Pulse 62   Wt 158 lb 3.2 oz (71.8 kg)   SpO2  94%   BMI 23.36 kg/m    Review of Systems Denies leg cramps    Objective:   Physical Exam VITAL SIGNS:  See vs page.  GENERAL: no distress.  Ext: no leg edema.     A1c=6.3% (cheked at pt's request)     Assessment & Plan:  elev aldo/renin ratio. Due for recheck.  Patient Instructions  blood tests are requested for you today.  We'll let you know about the results. Please come back for a follow-up appointment in 3 months.

## 2017-07-14 NOTE — Patient Instructions (Addendum)
blood tests are requested for you today.  We'll let you know about the results.   Please come back for a follow-up appointment in 3 months.   

## 2017-07-27 ENCOUNTER — Other Ambulatory Visit: Payer: Self-pay

## 2017-07-27 ENCOUNTER — Other Ambulatory Visit: Payer: Self-pay | Admitting: Family Medicine

## 2017-07-27 DIAGNOSIS — I10 Essential (primary) hypertension: Secondary | ICD-10-CM

## 2017-07-27 MED ORDER — LISINOPRIL 10 MG PO TABS: 10.0000 mg | ORAL_TABLET | Freq: Every day | ORAL | 0 refills | Status: DC

## 2017-08-25 ENCOUNTER — Ambulatory Visit: Payer: No Typology Code available for payment source | Admitting: Cardiovascular Disease

## 2017-08-28 ENCOUNTER — Other Ambulatory Visit: Payer: Self-pay | Admitting: Family Medicine

## 2017-09-29 ENCOUNTER — Telehealth: Payer: Self-pay

## 2017-09-29 NOTE — Telephone Encounter (Signed)
Spoke with patient, advised that we didn't prescribe her metoprolol and to call heart care to see if she needs to me on it on or not. Patient verbalized understanding. Thanks!

## 2017-10-01 ENCOUNTER — Encounter: Payer: Self-pay | Admitting: Family Medicine

## 2017-10-01 ENCOUNTER — Ambulatory Visit (INDEPENDENT_AMBULATORY_CARE_PROVIDER_SITE_OTHER): Payer: Self-pay | Admitting: Family Medicine

## 2017-10-01 VITALS — BP 140/82 | HR 52 | Temp 98.1°F | Resp 14 | Ht 69.0 in | Wt 156.0 lb

## 2017-10-01 DIAGNOSIS — I10 Essential (primary) hypertension: Secondary | ICD-10-CM

## 2017-10-01 DIAGNOSIS — E119 Type 2 diabetes mellitus without complications: Secondary | ICD-10-CM

## 2017-10-01 LAB — POCT URINALYSIS DIP (DEVICE)
BILIRUBIN URINE: NEGATIVE
GLUCOSE, UA: NEGATIVE mg/dL
Hgb urine dipstick: NEGATIVE
KETONES UR: NEGATIVE mg/dL
Leukocytes, UA: NEGATIVE
Nitrite: NEGATIVE
Protein, ur: >=300 mg/dL — AB
Specific Gravity, Urine: 1.02 (ref 1.005–1.030)
Urobilinogen, UA: 0.2 mg/dL (ref 0.0–1.0)
pH: 7 (ref 5.0–8.0)

## 2017-10-01 LAB — POCT GLYCOSYLATED HEMOGLOBIN (HGB A1C): HEMOGLOBIN A1C: 6.6

## 2017-10-01 MED ORDER — METFORMIN HCL ER 500 MG PO TB24: 1000.0000 mg | ORAL_TABLET | Freq: Every day | ORAL | 5 refills | Status: DC

## 2017-10-01 NOTE — Patient Instructions (Signed)
Your hemoglobin is at goal at 6.6. Goal is < 7.  Your A1C goal is less than 7. Your fasting blood sugar  Upon awakening goal is between 110-140.  Your LDL  (bad cholesterol goal is less than 100 Blood pressure goal is <140/90.  Recommend a lowfat, low carbohydrate diet divided over 5-6 small meals, increase water intake to 6-8 glasses, and 150 minutes per week of cardiovascular exercise.   Take your medications as prescribed Make sure that you are familiar with each one of your medications and what they are used to treat.  If you are unsure of medications, please bring to follow up Will send referral for eye exam  Please keep your scheduled follow up appointment.

## 2017-10-02 LAB — BASIC METABOLIC PANEL
BUN/Creatinine Ratio: 9 — ABNORMAL LOW (ref 12–28)
BUN: 9 mg/dL (ref 8–27)
CALCIUM: 9.2 mg/dL (ref 8.7–10.3)
CO2: 26 mmol/L (ref 20–29)
CREATININE: 0.99 mg/dL (ref 0.57–1.00)
Chloride: 100 mmol/L (ref 96–106)
GFR, EST AFRICAN AMERICAN: 70 mL/min/{1.73_m2} (ref >59)
GFR, EST NON AFRICAN AMERICAN: 61 mL/min/{1.73_m2} (ref >59)
Glucose: 118 mg/dL — ABNORMAL HIGH (ref 65–99)
POTASSIUM: 3.5 mmol/L (ref 3.5–5.2)
Sodium: 144 mmol/L (ref 134–144)

## 2017-10-02 LAB — MICROALBUMIN / CREATININE URINE RATIO
Creatinine, Urine: 59 mg/dL
MICROALB/CREAT RATIO: 1891.9 mg/g{creat} — AB (ref 0.0–30.0)
MICROALBUM., U, RANDOM: 1116.2 ug/mL

## 2017-10-04 NOTE — Progress Notes (Signed)
Chief Complaint  Patient presents with  . Hypertension  . Diabetes    Subjective:    Patient ID: Amber Suarez, female    DOB: 03-11-1956, 62 y.o.   MRN: [id number]  Amber Suarez, a 62 year old female with a history of adrenal deficiency, accelerated hypertension, and type 2 diabetes mellitus presents for a follow up of chronic conditions. Patient says that she is doing well without complaint.    Hypertension  This is a chronic problem. The problem is controlled. Pertinent negatives include no anxiety, blurred vision, chest pain, peripheral edema or shortness of breath. Risk factors for coronary artery disease include sedentary lifestyle and smoking/tobacco exposure. There is no history of heart failure or left ventricular hypertrophy. Identifiable causes of hypertension include hyperaldosteronism.  Diabetes  She has type 2 diabetes mellitus. Her disease course has been stable. Pertinent negatives for diabetes include no blurred vision and no chest pain. Current diabetic treatment includes diet and oral agent (monotherapy). She is compliant with treatment all of the time. She does not see a podiatrist.Eye exam is not current.   Past Medical History:  Diagnosis Date  . CHF (congestive heart failure) (HCC)   . Diabetes mellitus   . Heart murmur   . Hyperlipidemia   . Hypertension   . Myocardial infarction (HCC)   . Stroke [facility])    Social History   Socioeconomic History  . Marital status: Divorced    Spouse name: Not on file  . Number of children: Not on file  . Years of education: Not on file  . Highest education level: Not on file  Social Needs  . Financial resource strain: Not on file  . Food insecurity - worry: Not on file  . Food insecurity - inability: Not on file  . Transportation needs - medical: Not on file  . Transportation needs - non-medical: Not on file  Occupational History  . Not on file  Tobacco Use  . Smoking status: Current Every Day Smoker     Packs/day: 0.50    Years: 40.00    Pack years: 20.00    Types: Cigarettes  . Smokeless tobacco: Never Used  Substance and Sexual Activity  . Alcohol use: No  . Drug use: No  . Sexual activity: Not Currently    Birth control/protection: Surgical  Other Topics Concern  . Not on file  Social History Narrative  . Not on file   Immunization History  Administered Date(s) Administered  . Influenza Split 06/26/2011, 07/14/2012  . Influenza Whole 06/15/2008  . Pneumococcal Polysaccharide-23 10/27/2013  . Td 01/18/2004  . Tdap 01/16/2017   Outpatient Encounter Medications as of 10/01/2017  Medication Sig  . Blood Pressure Monitoring DEVI 1 each by Does not apply route daily.  Marland Kitchen buPROPion (WELLBUTRIN XL) 150 MG 24 hr tablet Take 1 tablet (150 mg total) by mouth daily.  . isosorbide mononitrate (IMDUR) 30 MG 24 hr tablet TAKE 1 TABLET BY MOUTH DAILY  . lisinopril (PRINIVIL,ZESTRIL) 10 MG tablet Take 1 tablet (10 mg total) daily by mouth.  . meloxicam (MOBIC) 7.5 MG tablet Take 7.5 mg by mouth daily.  . metFORMIN (GLUCOPHAGE-XR) 500 MG 24 hr tablet Take 2 tablets (1,000 mg total) by mouth daily.  . potassium chloride SA (K-DUR,KLOR-CON) 20 MEQ tablet Take 1 tablet (20 mEq total) by mouth 2 (two) times daily.  . pravastatin (PRAVACHOL) 40 MG tablet TAKE 1 TABLET BY MOUTH DAILY  . spironolactone (ALDACTONE) 50 MG tablet Take 1 tablet (50  mg total) by mouth daily.  . [DISCONTINUED] metFORMIN (GLUCOPHAGE-XR) 500 MG 24 hr tablet Take 2 tablets (1,000 mg total) by mouth daily.  . nitroGLYCERIN (NITROSTAT) 0.4 MG SL tablet Place 0.4 mg under the tongue every 5 (five) minutes as needed. Call 911 if no improvement after 3rd tab    No facility-administered encounter medications on file as of 10/01/2017.      Review of Systems  Eyes: Negative for blurred vision, photophobia and visual disturbance.  Respiratory: Negative for shortness of breath.   Cardiovascular: Negative for chest pain.   Gastrointestinal: Negative.   Genitourinary: Negative.   Musculoskeletal: Negative.   Skin: Negative.   Neurological: Negative.   Hematological: Negative.        Objective:   Physical Exam  Constitutional: She appears well-developed and well-nourished.  HENT:  Head: Normocephalic and atraumatic.  Right Ear: External ear normal.  Left Ear: External ear normal.  Nose: Nose normal.  Mouth/Throat: Oropharynx is clear and moist.  Eyes: Pupils are equal, round, and reactive to light.  Neck: Normal range of motion. Neck supple.  Cardiovascular: Normal rate, regular rhythm, normal heart sounds and intact distal pulses.  Pulmonary/Chest: Effort normal and breath sounds normal.  Abdominal: Soft. Bowel sounds are normal.  Neurological: She is alert.  Skin: Skin is warm.  Psychiatric: She has a normal mood and affect. Her behavior is normal. Judgment and thought content normal.         BP 140/82 (BP Location: Left Arm, Patient Position: Sitting, Cuff Size: Normal) Comment: manually  Pulse (!) 52   Temp 98.1 F (36.7 C) (Oral)   Resp 14   Ht 5\' 9"  (1.753 m)   Wt 156 lb (70.8 kg)   SpO2 100%   BMI 23.04 kg/m  Assessment & Plan:  Controlled type 2 diabetes mellitus without complication, without long-term current use of insulin (HCC) Hemoglobin a1C is 6.6, which is at goal. Will continue Metformin 1000 mg daily.  Also, continue carbohydrate modified diet.  - HgB A1c - POCT urinalysis dip (device) - metFORMIN (GLUCOPHAGE-XR) 500 MG 24 hr tablet; Take 2 tablets (1,000 mg total) by mouth daily.  Dispense: 180 tablet; Refill: 5 - Basic Metabolic Panel - Microalbumin/Creatinine Ratio, Urine  HYPERTENSION, BENIGN ESSENTIAL Blood pressure is at goal on current medication regimen. Follow up with Dr. Everardo AllEllison, endocrinologist for adrenal deficiency as scheduled No medication changes warranted on today - POCT urinalysis dip (device) - Basic Metabolic Panel - Microalbumin/Creatinine  Ratio, Urine  Tobacco dependence Smoking cessation instruction/counseling given:  counseled patient on the dangers of tobacco use, advised patient to stop smoking, and reviewed strategies to maximize success   RTC: 6 months for chronic conditions   Nolon NationsLachina Moore Davanna He  MSN, FNP-C [facility] [address]  [place], [place] [id number] [phone]

## 2017-10-14 ENCOUNTER — Encounter: Payer: Self-pay | Admitting: Endocrinology

## 2017-10-14 ENCOUNTER — Ambulatory Visit (INDEPENDENT_AMBULATORY_CARE_PROVIDER_SITE_OTHER): Payer: No Typology Code available for payment source | Admitting: Endocrinology

## 2017-10-14 DIAGNOSIS — E269 Hyperaldosteronism, unspecified: Secondary | ICD-10-CM

## 2017-10-14 MED ORDER — SPIRONOLACTONE 50 MG PO TABS: 50.0000 mg | ORAL_TABLET | Freq: Two times a day (BID) | ORAL | 3 refills | Status: DC

## 2017-10-14 NOTE — Progress Notes (Signed)
Subjective:    Patient ID: Amber Suarez, female    DOB: 03/08/1956, 62 y.o.   MRN: [id number]  HPI Pt returns for f/u of elevated aldo/renin ratio (she had hypokalemia at least as far back as 2008; CT in 2018 showed normal adrenals; she was started on aldactone in mid-2018).  pt states she feels well in general.  Past Medical History:  Diagnosis Date  . CHF (congestive heart failure) (HCC)   . Diabetes mellitus   . Heart murmur   . Hyperlipidemia   . Hypertension   . Myocardial infarction (HCC)   . Stroke [facility](HCC)     Past Surgical History:  Procedure Laterality Date  . ABDOMINAL HYSTERECTOMY    . angoiplasty    . LEFT HEART CATHETERIZATION WITH CORONARY ANGIOGRAM N/A 01/28/2012   Procedure: LEFT HEART CATHETERIZATION WITH CORONARY ANGIOGRAM;  Surgeon: Marykay Lexavid W Harding, MD;  [facility];  Service: Cardiovascular;  Laterality: N/A;  . repair of aneursym  1997    Social History   Socioeconomic History  . Marital status: Divorced    Spouse name: Not on file  . Number of children: Not on file  . Years of education: Not on file  . Highest education level: Not on file  Social Needs  . Financial resource strain: Not on file  . Food insecurity - worry: Not on file  . Food insecurity - inability: Not on file  . Transportation needs - medical: Not on file  . Transportation needs - non-medical: Not on file  Occupational History  . Not on file  Tobacco Use  . Smoking status: Current Every Day Smoker    Packs/day: 0.50    Years: 40.00    Pack years: 20.00    Types: Cigarettes  . Smokeless tobacco: Never Used  Substance and Sexual Activity  . Alcohol use: No  . Drug use: No  . Sexual activity: Not Currently    Birth control/protection: Surgical  Other Topics Concern  . Not on file  Social History Narrative  . Not on file    Current Outpatient Medications on File Prior to Visit  Medication Sig Dispense Refill  . Blood Pressure Monitoring DEVI 1 each by Does  not apply route daily. 1 Device 0  . buPROPion (WELLBUTRIN XL) 150 MG 24 hr tablet Take 1 tablet (150 mg total) by mouth daily. 30 tablet 5  . isosorbide mononitrate (IMDUR) 30 MG 24 hr tablet TAKE 1 TABLET BY MOUTH DAILY 30 tablet 3  . lisinopril (PRINIVIL,ZESTRIL) 10 MG tablet Take 1 tablet (10 mg total) daily by mouth. 90 tablet 0  . meloxicam (MOBIC) 7.5 MG tablet Take 7.5 mg by mouth daily.    . metFORMIN (GLUCOPHAGE-XR) 500 MG 24 hr tablet Take 2 tablets (1,000 mg total) by mouth daily. 180 tablet 5  . nitroGLYCERIN (NITROSTAT) 0.4 MG SL tablet Place 0.4 mg under the tongue every 5 (five) minutes as needed. Call 911 if no improvement after 3rd tab     . potassium chloride SA (K-DUR,KLOR-CON) 20 MEQ tablet Take 1 tablet (20 mEq total) by mouth 2 (two) times daily. 60 tablet 3  . pravastatin (PRAVACHOL) 40 MG tablet TAKE 1 TABLET BY MOUTH DAILY 30 tablet 3   No current facility-administered medications on file prior to visit.     Allergies  Allergen Reactions  . Morphine And Related Itching and Other (See Comments)    burning    Family History  Problem Relation Age of Onset  .  Diabetes Mother   . Hypertension Mother   . Hypertension Father   . Hypertension Sister     BP (!) 198/110 (BP Location: Left Arm, Patient Position: Sitting, Cuff Size: Large) Comment: Pt states she has not taken B/P meds today  Pulse 69   Ht 5\' 9"  (1.753 m)   Wt 153 lb 12.8 oz (69.8 kg)   SpO2 99%   BMI 22.71 kg/m    Review of Systems Denies cramps.     Objective:   Physical Exam VITAL SIGNS:  See vs page GENERAL: no distress LUNGS:  Clear to auscultation.    Ext: no leg edema.   Lab Results  Component Value Date   CREATININE 0.99 10/01/2017   BUN 9 10/01/2017   NA 144 10/01/2017   K 3.5 10/01/2017   CL 100 10/01/2017   CO2 26 10/01/2017      Assessment & Plan:  HTN, with elev aldo/renin ratio.  She needs increased rx.    Patient Instructions  I have sent a prescription to your  pharmacy, to increase the spironolactone to twice a day.  Please come back for a follow-up appointment in 1 month.

## 2017-10-14 NOTE — Patient Instructions (Addendum)
I have sent a prescription to your pharmacy, to increase the spironolactone to twice a day.  Please come back for a follow-up appointment in 1 month.

## 2017-11-09 ENCOUNTER — Ambulatory Visit (INDEPENDENT_AMBULATORY_CARE_PROVIDER_SITE_OTHER): Payer: No Typology Code available for payment source | Admitting: Cardiovascular Disease

## 2017-11-09 ENCOUNTER — Encounter: Payer: Self-pay | Admitting: Cardiovascular Disease

## 2017-11-09 VITALS — BP 192/120 | HR 51 | Ht 69.0 in | Wt 155.1 lb

## 2017-11-09 DIAGNOSIS — I10 Essential (primary) hypertension: Secondary | ICD-10-CM

## 2017-11-09 DIAGNOSIS — E269 Hyperaldosteronism, unspecified: Secondary | ICD-10-CM

## 2017-11-09 MED ORDER — AMLODIPINE BESYLATE 5 MG PO TABS: 5.0000 mg | ORAL_TABLET | Freq: Every day | ORAL | 3 refills | Status: DC

## 2017-11-09 NOTE — Patient Instructions (Signed)
Medication Instructions:  Your physician has recommended you make the following change in your medication:   START: amlodipine 5 mg daily  Labwork: None ordered  Testing/Procedures: None ordered  Follow-Up: Your physician recommends that you schedule a follow-up appointment in: 1-2 weeks in the Hypertension Clinic for Blood Pressure Management.   Your physician wants you to follow-up in: 6 months with Dr. Elease HashimotoNahser. You will receive a reminder letter in the mail two months in advance. If you don't receive a letter, please call our office to schedule the follow-up appointment.   Any Other Special Instructions Will Be Listed Below (If Applicable).     If you need a refill on your cardiac medications before your next appointment, please call your pharmacy.

## 2017-11-09 NOTE — Progress Notes (Signed)
Cardiology Office Note   Date:  11/09/2017   ID:  Amber, Suarez Apr 15, 1956, MRN [id number]  PCP:  Massie Maroon, FNP  Cardiologist:   Kristeen Miss, MD   Chief Complaint  Patient presents with  . Hypertension   Problem list 1. Bradycardia 2. Essential hypertension 3. History of coronary artery disease 4. Hyperlipidemia 5. Diabetes mellitus 6. Cerebral aneurism with CVA - 1997.     February 19, 2017 Amber Suarez is a 62 y.o. female who is being seen today for the evaluation of bradycardia and HTN  at the request of Massie Maroon, FNP. Seen with daughter, [name].   She has been seen by Dr. Herbie Baltimore in the past. She previously saw Dr. Clarene Duke. She had a cardiac catheterization in May, 2013 which revealed nonobstructive coronary artery disease with small vessel ectasia. 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  She has not had any recent chest pain  No syncope or presyncope  She was at her medical doctors office today for routine medical checkup. She was noted have a slow heart rate and hypertension.  BP  164/84 with HR of 54.  She received clonidine 0.2 mg orally at her medical doctors office and her blood pressure is much better.  HR has slowed further ( due to the clonidine )   February 19, 2017:  [name] is doing much better in the Aldactone  No CP or dyspnea   Feb. 25 ,2019: BP is up.    Feels fine.   Still eating some additional salt .  Does not exercise.  Walks on occasion    Past Medical History:  Diagnosis Date  . CHF (congestive heart failure) (HCC)   . Diabetes mellitus   . Heart murmur   . Hyperlipidemia   . Hypertension   . Myocardial infarction (HCC)   . Stroke [facility])     Past Surgical History:  Procedure Laterality Date  . ABDOMINAL HYSTERECTOMY    . angoiplasty    . LEFT HEART CATHETERIZATION WITH CORONARY ANGIOGRAM N/A 01/28/2012   Procedure: LEFT HEART CATHETERIZATION WITH CORONARY ANGIOGRAM;  Surgeon: Marykay Lex, MD;   Location[facility] C[facility] LAB;  Service: Cardiovascular;  Laterality: N/A;  . repair of aneursym  1997     Current Outpatient Medications  Medication Sig Dispense Refill  . Blood Pressure Monitoring DEVI 1 each by Does not apply route daily. 1 Device 0  . buPROPion (WELLBUTRIN XL) 150 MG 24 hr tablet Take 1 tablet (150 mg total) by mouth daily. 30 tablet 5  . isosorbide mononitrate (IMDUR) 30 MG 24 hr tablet TAKE 1 TABLET BY MOUTH DAILY 30 tablet 3  . lisinopril (PRINIVIL,ZESTRIL) 10 MG tablet Take 1 tablet (10 mg total) daily by mouth. 90 tablet 0  . meloxicam (MOBIC) 7.5 MG tablet Take 7.5 mg by mouth daily.    . metFORMIN (GLUCOPHAGE-XR) 500 MG 24 hr tablet Take 2 tablets (1,000 mg total) by mouth daily. 180 tablet 5  . nitroGLYCERIN (NITROSTAT) 0.4 MG SL tablet Place 0.4 mg under the tongue every 5 (five) minutes as needed. Call 911 if no improvement after 3rd tab     . potassium chloride SA (K-DUR,KLOR-CON) 20 MEQ tablet Take 1 tablet (20 mEq total) by mouth 2 (two) times daily. 60 tablet 3  . pravastatin (PRAVACHOL) 40 MG tablet TAKE 1 TABLET BY MOUTH DAILY 30 tablet 3  . spironolactone (ALDACTONE) 50 MG tablet Take 1 tablet (50 mg total) by mouth 2 (  two) times daily. 180 tablet 3   No current facility-administered medications for this visit.     PAD Screen 01/22/2017  Previous PAD dx? No  Previous surgical procedure? No  Pain with walking? No  Feet/toe relief with dangling? No  Painful, non-healing ulcers? No  Extremities discolored? No      Allergies:   Morphine and related    Social History:  The patient  reports that she has been smoking cigarettes.  She has a 20.00 pack-year smoking history. she has never used smokeless tobacco. She reports that she does not drink alcohol or use drugs.   Family History:  The patient's family history includes Diabetes in her mother; Hypertension in her father, mother, and sister.    ROS: Noted in current history, all other systems are  negative.    Physical Exam: Blood pressure (!) 192/120, pulse (!) 51, height 5\' 9"  (1.753 m), weight 155 lb 1.9 oz (70.4 kg), SpO2 98 %.  GEN:  Well nourished, well developed in no acute distress HEENT: Normal NECK: No JVD; No carotid bruits LYMPHATICS: No lymphadenopathy CARDIAC: RR , no murmurs, rubs, gallops RESPIRATORY:  Clear to auscultation without rales, wheezing or rhonchi  ABDOMEN: Soft, non-tender, non-distended MUSCULOSKELETAL:  No edema; No deformity  SKIN: Warm and dry NEUROLOGIC:  Alert and oriented x 3   EKG:   FEb. 25, 2019:   Sinus brady,  TWI inf and lat.     Recent Labs: 01/16/2017: ALT 4; Hemoglobin 13.2; Platelets 139; TSH 1.15 10/01/2017: BUN 9; Creatinine, Ser 0.99; Potassium 3.5; Sodium 144    Lipid Panel    Component Value Date/Time   CHOL 145 08/15/2016 0845   TRIG 94 08/15/2016 0845   HDL 31 (L) 08/15/2016 0845   CHOLHDL 4.7 08/15/2016 0845   VLDL 19 08/15/2016 0845   LDLCALC 95 08/15/2016 0845   LDLDIRECT 133 (H) 07/14/2012 1402      Wt Readings from Last 3 Encounters:  11/09/17 155 lb 1.9 oz (70.4 kg)  10/14/17 153 lb 12.8 oz (69.8 kg)  10/01/17 156 lb (70.8 kg)      Other studies Reviewed: Additional studies/ records that were reviewed today include: . Review of the above records demonstrates:    ASSESSMENT AND PLAN:   1. Hypertension:  Pets his blood pressure remains elevated.  .  She still eats a bit of extra salt in her diet.  She still smokes.  I encouraged her to stop smoking and to watch her salt intake.  We will add amlodipine 5 mg a day.  We will also likely need to add additional diuretics.  Would likely need to consider additional Aldactone or perhaps  Lasix and additional potassium.  We will send her to the hypertension clinic for further evaluation and titration of medications. I will see her in 6 months.  2.  Hyperaldosteronism: I would by endocrinology.  Continue Aldactone.  She might benefit from additional  Aldactone.  3. Sinus Bradycardia:  Stable    2. Coronary artery disease:   has mild CAD by cath .    Current medicines are reviewed at length with the patient today.  The patient does not have concerns regarding medicines.  Labs/ tests ordered today include:  No orders of the defined types were placed in this encounter.   Disposition:   Office visit in 6 months     Kristeen MissPhilip Loyce Klasen, MD  11/09/2017 8:17 AM    [facility] [address], [place],  [place]  [address] Phone: [phone]; Fax: [phone]

## 2017-11-11 ENCOUNTER — Ambulatory Visit (INDEPENDENT_AMBULATORY_CARE_PROVIDER_SITE_OTHER): Payer: No Typology Code available for payment source | Admitting: Endocrinology

## 2017-11-11 ENCOUNTER — Encounter: Payer: Self-pay | Admitting: Endocrinology

## 2017-11-11 VITALS — BP 180/96 | HR 71 | Ht 69.0 in | Wt 155.0 lb

## 2017-11-11 DIAGNOSIS — I152 Hypertension secondary to endocrine disorders: Secondary | ICD-10-CM

## 2017-11-11 MED ORDER — SPIRONOLACTONE 100 MG PO TABS: 100.0000 mg | ORAL_TABLET | Freq: Two times a day (BID) | ORAL | 11 refills | Status: DC

## 2017-11-11 NOTE — Progress Notes (Signed)
Subjective:    Patient ID: Amber Suarez, female    DOB: 10/30/1955, 62 y.o.   MRN: [id number]  HPI Pt returns for f/u of elevated aldo/renin ratio (she had hypokalemia at least as far back as 2008; CT in 2018 showed normal adrenals; she was started on aldactone in mid-2018).  pt states she feels well in general.  norvasc was added 2 days ago.   Past Medical History:  Diagnosis Date  . CHF (congestive heart failure) (HCC)   . Diabetes mellitus   . Heart murmur   . Hyperlipidemia   . Hypertension   . Myocardial infarction (HCC)   . Stroke [facility](HCC)     Past Surgical History:  Procedure Laterality Date  . ABDOMINAL HYSTERECTOMY    . angoiplasty    . LEFT HEART CATHETERIZATION WITH CORONARY ANGIOGRAM N/A 01/28/2012   Procedure: LEFT HEART CATHETERIZATION WITH CORONARY ANGIOGRAM;  Surgeon: Marykay Lexavid W Harding, MD;  [facility];  Service: Cardiovascular;  Laterality: N/A;  . repair of aneursym  1997    Social History   Socioeconomic History  . Marital status: Divorced    Spouse name: Not on file  . Number of children: Not on file  . Years of education: Not on file  . Highest education level: Not on file  Social Needs  . Financial resource strain: Not on file  . Food insecurity - worry: Not on file  . Food insecurity - inability: Not on file  . Transportation needs - medical: Not on file  . Transportation needs - non-medical: Not on file  Occupational History  . Not on file  Tobacco Use  . Smoking status: Current Every Day Smoker    Packs/day: 0.50    Years: 40.00    Pack years: 20.00    Types: Cigarettes  . Smokeless tobacco: Never Used  Substance and Sexual Activity  . Alcohol use: No  . Drug use: No  . Sexual activity: Not Currently    Birth control/protection: Surgical  Other Topics Concern  . Not on file  Social History Narrative  . Not on file    Current Outpatient Medications on File Prior to Visit  Medication Sig Dispense Refill  . amLODipine  (NORVASC) 5 MG tablet Take 1 tablet (5 mg total) by mouth daily. 90 tablet 3  . Blood Pressure Monitoring DEVI 1 each by Does not apply route daily. 1 Device 0  . buPROPion (WELLBUTRIN XL) 150 MG 24 hr tablet Take 1 tablet (150 mg total) by mouth daily. 30 tablet 5  . isosorbide mononitrate (IMDUR) 30 MG 24 hr tablet TAKE 1 TABLET BY MOUTH DAILY 30 tablet 3  . lisinopril (PRINIVIL,ZESTRIL) 10 MG tablet Take 1 tablet (10 mg total) daily by mouth. 90 tablet 0  . meloxicam (MOBIC) 7.5 MG tablet Take 7.5 mg by mouth daily.    . metFORMIN (GLUCOPHAGE-XR) 500 MG 24 hr tablet Take 2 tablets (1,000 mg total) by mouth daily. 180 tablet 5  . nitroGLYCERIN (NITROSTAT) 0.4 MG SL tablet Place 0.4 mg under the tongue every 5 (five) minutes as needed. Call 911 if no improvement after 3rd tab     . potassium chloride SA (K-DUR,KLOR-CON) 20 MEQ tablet Take 1 tablet (20 mEq total) by mouth 2 (two) times daily. 60 tablet 3  . pravastatin (PRAVACHOL) 40 MG tablet TAKE 1 TABLET BY MOUTH DAILY 30 tablet 3   No current facility-administered medications on file prior to visit.     Allergies  Allergen Reactions  . Morphine And Related Itching and Other (See Comments)    burning    Family History  Problem Relation Age of Onset  . Diabetes Mother   . Hypertension Mother   . Hypertension Father   . Hypertension Sister     BP (!) 180/96 (BP Location: Left Arm, Patient Position: Sitting, Cuff Size: Normal)   Pulse 71   Ht 5\' 9"  (1.753 m)   Wt 155 lb (70.3 kg)   SpO2 97%   BMI 22.89 kg/m    Review of Systems Denies sob.      Objective:   Physical Exam VITAL SIGNS:  See vs page GENERAL: no distress Ext: no leg edema.     Assessment & Plan:  HTN, on increased rx elev aldo/renin ratio.  She needs increased rx.  Patient Instructions  I have sent a prescription to your pharmacy, to increase the spironolactone to 100 mg, twice a day.  Please continue the same other blood pressure medications.     Please come back for a follow-up appointment in 2-3 weeks.

## 2017-11-11 NOTE — Patient Instructions (Addendum)
I have sent a prescription to your pharmacy, to increase the spironolactone to 100 mg, twice a day.  Please continue the same other blood pressure medications.   Please come back for a follow-up appointment in 2-3 weeks.

## 2017-11-26 ENCOUNTER — Ambulatory Visit (INDEPENDENT_AMBULATORY_CARE_PROVIDER_SITE_OTHER): Payer: No Typology Code available for payment source | Admitting: Pharmacist

## 2017-11-26 VITALS — BP 158/106 | HR 77

## 2017-11-26 DIAGNOSIS — F17200 Nicotine dependence, unspecified, uncomplicated: Secondary | ICD-10-CM

## 2017-11-26 DIAGNOSIS — I10 Essential (primary) hypertension: Secondary | ICD-10-CM

## 2017-11-26 LAB — BASIC METABOLIC PANEL
BUN/Creatinine Ratio: 11 — ABNORMAL LOW (ref 12–28)
BUN: 13 mg/dL (ref 8–27)
CO2: 25 mmol/L (ref 20–29)
CREATININE: 1.22 mg/dL — AB (ref 0.57–1.00)
Calcium: 10.1 mg/dL (ref 8.7–10.3)
Chloride: 99 mmol/L (ref 96–106)
GFR, EST AFRICAN AMERICAN: 54 mL/min/{1.73_m2} — AB (ref >59)
GFR, EST NON AFRICAN AMERICAN: 47 mL/min/{1.73_m2} — AB (ref >59)
Glucose: 70 mg/dL (ref 65–99)
Potassium: 4.9 mmol/L (ref 3.5–5.2)
SODIUM: 138 mmol/L (ref 134–144)

## 2017-11-26 MED ORDER — AMLODIPINE BESYLATE 10 MG PO TABS: 10.0000 mg | ORAL_TABLET | Freq: Every day | ORAL | 3 refills | Status: DC

## 2017-11-26 MED ORDER — NICOTINE 21 MG/24HR TD PT24: 21.0000 mg | MEDICATED_PATCH | Freq: Every day | TRANSDERMAL | 3 refills | Status: DC

## 2017-11-26 NOTE — Progress Notes (Addendum)
Patient ID: Amber Suarez                 DOB: [birth year]                      MRN: [id number]     HPI: Amber Suarez is a 62 y.o. female referred by Dr. Elease Hashimoto to HTN clinic. PMH is significant for HTN, CHF, DM, HLD, MI, stroke, sinus bradycardia, and elevated aldosterone/renin ratio. BP was elevated at 192/120 at last visit 3 weeks ago and she was started on amlodipine 5mg  daily. 2 days later, she saw Dr Everardo All who increased her spironolactone to 100mg  BID for better BP control and primary hyperaldosteronism.  Pt presents today in good spirits. She reports tolerating her medications well. Denies dizziness, blurred vision, falls, or headache. She does not check her blood pressure at home because she believes her cuff is broken. She has been smoking 1 PPD but seems interested in quitting. She has tried bupriopion but states this did not work - she does continue to take this. She tried Chantix but experienced weird dreams. She has also tried the patch but did not have any problems tolerating it.  Current HTN meds: amlodipine 5mg  daily, lisinopril 10mg  daily, spironolactone 100mg  BID, Imdur 30mg  daily BP goal: <130/57mmHg  Family History: The patient's family history includes Diabetes in her mother; Hypertension in her father, mother, and sister.   Social History: The patient  reports that she has been smoking cigarettes.  She has a 20.00 pack-year smoking history. she has never used smokeless tobacco. She reports that she does not drink alcohol or use drugs.   Diet: Switched from canned to frozen vegetables. Switched to decaf coffee. Cooks most meals at home. Adds salt occasionally to food. Likes smoked [place] and beef hotdogs.  Exercise: Walks for 30 minutes each day.   Home BP readings: Does not check.  Wt Readings from Last 3 Encounters:  11/11/17 155 lb (70.3 kg)  11/09/17 155 lb 1.9 oz (70.4 kg)  10/14/17 153 lb 12.8 oz (69.8 kg)   BP Readings from Last 3 Encounters:  11/11/17  (!) 180/96  11/09/17 (!) 192/120  10/14/17 (!) 198/110   Pulse Readings from Last 3 Encounters:  11/11/17 71  11/09/17 (!) 51  10/14/17 69    Renal function: CrCl cannot be calculated (Patient's most recent lab result is older than the maximum 21 days allowed.).  Past Medical History:  Diagnosis Date  . CHF (congestive heart failure) (HCC)   . Diabetes mellitus   . Heart murmur   . Hyperlipidemia   . Hypertension   . Myocardial infarction (HCC)   . Stroke [facility])     Current Outpatient Medications on File Prior to Visit  Medication Sig Dispense Refill  . amLODipine (NORVASC) 5 MG tablet Take 1 tablet (5 mg total) by mouth daily. 90 tablet 3  . Blood Pressure Monitoring DEVI 1 each by Does not apply route daily. 1 Device 0  . buPROPion (WELLBUTRIN XL) 150 MG 24 hr tablet Take 1 tablet (150 mg total) by mouth daily. 30 tablet 5  . isosorbide mononitrate (IMDUR) 30 MG 24 hr tablet TAKE 1 TABLET BY MOUTH DAILY 30 tablet 3  . lisinopril (PRINIVIL,ZESTRIL) 10 MG tablet Take 1 tablet (10 mg total) daily by mouth. 90 tablet 0  . meloxicam (MOBIC) 7.5 MG tablet Take 7.5 mg by mouth daily.    . metFORMIN (GLUCOPHAGE-XR) 500 MG 24 hr tablet Take  2 tablets (1,000 mg total) by mouth daily. 180 tablet 5  . nitroGLYCERIN (NITROSTAT) 0.4 MG SL tablet Place 0.4 mg under the tongue every 5 (five) minutes as needed. Call 911 if no improvement after 3rd tab     . potassium chloride SA (K-DUR,KLOR-CON) 20 MEQ tablet Take 1 tablet (20 mEq total) by mouth 2 (two) times daily. 60 tablet 3  . pravastatin (PRAVACHOL) 40 MG tablet TAKE 1 TABLET BY MOUTH DAILY 30 tablet 3  . spironolactone (ALDACTONE) 100 MG tablet Take 1 tablet (100 mg total) by mouth 2 (two) times daily. 60 tablet 11   No current facility-administered medications on file prior to visit.     Allergies  Allergen Reactions  . Morphine And Related Itching and Other (See Comments)    burning     Assessment/Plan:  1. Hypertension -  BP much improved today however remains above goal <130/5480mmHg. Will increase amlodipine to 10mg  daily. Will check BMET today with recent dose increase of spironolactone. If stable, will also increase lisinopril to 20mg  daily. Continue all other meds. F/u in HTN clinic in 4 weeks.  2. Tobacco abuse - Pt is currently smoking 1 PPD but is interested in quitting. She is willing to try the nicotine patch again. Sent rx for 21mg  dose and advised pt to remove patch at night if it affects her sleeping. Will assess progress at next visit.   Lantz Hermann E. Kaprice Kage, PharmD, CPP, BCACP [facility] [address], [place], [place] [address] Phone: [phone]) [phone]; Fax: [phone]) [phone] 11/26/2017 11:27 AM  Addendum: SCr increased from 0.99 to 1.22, potentially due to recent spironolactone dose increase. Will still plan to increase amlodipine if discussed, but will continue lisinopril at current dose of 10mg  daily and reevaluate renal function again at next follow up. Pt is aware of plan.

## 2017-11-26 NOTE — Patient Instructions (Addendum)
It was nice to meet you today  Your blood pressure goal is less than 130/80  Increase your amlodipine to 10mg  daily  We will check labs today. If they are stable, we will increase your lisinopril to 20mg  daily  Continue taking your other medication for blood pressure  Start using the nicotine patch every day to help you quit smoking   Follow up in clinic in 4 weeks

## 2017-12-02 ENCOUNTER — Ambulatory Visit: Payer: No Typology Code available for payment source | Admitting: Endocrinology

## 2017-12-04 ENCOUNTER — Other Ambulatory Visit: Payer: Self-pay | Admitting: Family Medicine

## 2017-12-04 DIAGNOSIS — I10 Essential (primary) hypertension: Secondary | ICD-10-CM

## 2017-12-29 ENCOUNTER — Ambulatory Visit (INDEPENDENT_AMBULATORY_CARE_PROVIDER_SITE_OTHER): Payer: No Typology Code available for payment source | Admitting: Pharmacist

## 2017-12-29 VITALS — BP 134/80 | HR 75

## 2017-12-29 DIAGNOSIS — I10 Essential (primary) hypertension: Secondary | ICD-10-CM

## 2017-12-29 LAB — BASIC METABOLIC PANEL
BUN/Creatinine Ratio: 12 (ref 12–28)
BUN: 16 mg/dL (ref 8–27)
CALCIUM: 9.5 mg/dL (ref 8.7–10.3)
CO2: 24 mmol/L (ref 20–29)
Chloride: 100 mmol/L (ref 96–106)
Creatinine, Ser: 1.34 mg/dL — ABNORMAL HIGH (ref 0.57–1.00)
GFR, EST AFRICAN AMERICAN: 49 mL/min/{1.73_m2} — AB (ref >59)
GFR, EST NON AFRICAN AMERICAN: 42 mL/min/{1.73_m2} — AB (ref >59)
Glucose: 140 mg/dL — ABNORMAL HIGH (ref 65–99)
POTASSIUM: 4.1 mmol/L (ref 3.5–5.2)
Sodium: 138 mmol/L (ref 134–144)

## 2017-12-29 NOTE — Progress Notes (Signed)
Patient ID: Amber Suarez                 DOB: 16-Feb-1956                      MRN: [id number]     HPI: Amber Suarez is a 62 y.o. female referred by Dr. Elease Hashimoto to HTN clinic. PMH is significant for HTN, CHF, DM, HLD, MI, stroke, sinus bradycardia, and elevated aldosterone/renin ratio. BP was elevated at 192/120 at last visit 3 weeks ago and she was started on amlodipine 5mg  daily. 2 days later, she saw Dr Everardo All who increased her spironolactone to 100mg  BID for better BP control and primary hyperaldosteronism. F/u SCr increased from 0.99 to 1.22. Amlodipine was titrated and pt presents today for BP follow up and BMET.  Pt presents today in good spirits. She reports tolerating her medications well. Denies blurred vision, falls, or headache. She denies edema since increasing amlodipine dose. She does report occasional dizziness if she stands too quickly. She does not check her blood pressure at home because she believes her cuff is broken. She did check her BP at [facility] and reports improved reading of 136/80s. She took her medications 1 hour ago and has had 1 cup of coffee this morning about 2 hours ago.  She has been smoking 1 PPD but seems interested in quitting. She has tried bupriopion but states this did not work - she does continue to take this. She tried Chantix but experienced weird dreams. She has also tried the patch but did not have any problems tolerating it. She was started on the patch at last visit.  Current HTN meds: amlodipine 10mg  daily, lisinopril 10mg  daily, spironolactone 100mg  BID BP goal: <130/17mmHg  Family History: The patient's family history includes Diabetes in her mother; Hypertension in her father, mother, and sister.   Social History: The patient  reports that she has been smoking cigarettes.  She has a 20.00 pack-year smoking history. she has never used smokeless tobacco. She reports that she does not drink alcohol or use drugs.   Diet: Switched from canned to  frozen vegetables. Switched to decaf coffee. Cooks most meals at home. Adds salt occasionally to food. Likes smoked [place] and beef hotdogs.  Exercise: Walks for 30 minutes each day.   Home BP readings: Does not check.  Wt Readings from Last 3 Encounters:  11/11/17 155 lb (70.3 kg)  11/09/17 155 lb 1.9 oz (70.4 kg)  10/14/17 153 lb 12.8 oz (69.8 kg)   BP Readings from Last 3 Encounters:  11/26/17 (!) 158/106  11/11/17 (!) 180/96  11/09/17 (!) 192/120   Pulse Readings from Last 3 Encounters:  11/26/17 77  11/11/17 71  11/09/17 (!) 51    Renal function: CrCl cannot be calculated (Patient's most recent lab result is older than the maximum 21 days allowed.).  Past Medical History:  Diagnosis Date  . CHF (congestive heart failure) (HCC)   . Diabetes mellitus   . Heart murmur   . Hyperlipidemia   . Hypertension   . Myocardial infarction (HCC)   . Stroke [facility])     Current Outpatient Medications on File Prior to Visit  Medication Sig Dispense Refill  . amLODipine (NORVASC) 10 MG tablet Take 1 tablet (10 mg total) by mouth daily. 90 tablet 3  . Blood Pressure Monitoring DEVI 1 each by Does not apply route daily. 1 Device 0  . buPROPion (WELLBUTRIN XL) 150 MG 24 hr  tablet Take 1 tablet (150 mg total) by mouth daily. 30 tablet 5  . isosorbide mononitrate (IMDUR) 30 MG 24 hr tablet TAKE 1 TABLET BY MOUTH DAILY 30 tablet 3  . lisinopril (PRINIVIL,ZESTRIL) 10 MG tablet TAKE 1 TABLET (10 MG TOTAL) BY MOUTH DAILY. 90 tablet 0  . meloxicam (MOBIC) 7.5 MG tablet Take 7.5 mg by mouth daily.    . metFORMIN (GLUCOPHAGE-XR) 500 MG 24 hr tablet Take 2 tablets (1,000 mg total) by mouth daily. 180 tablet 5  . nicotine (NICODERM CQ - DOSED IN MG/24 HOURS) 21 mg/24hr patch Place 1 patch (21 mg total) onto the skin daily. 28 patch 3  . nitroGLYCERIN (NITROSTAT) 0.4 MG SL tablet Place 0.4 mg under the tongue every 5 (five) minutes as needed. Call 911 if no improvement after 3rd tab     . potassium  chloride SA (K-DUR,KLOR-CON) 20 MEQ tablet Take 1 tablet (20 mEq total) by mouth 2 (two) times daily. 60 tablet 3  . pravastatin (PRAVACHOL) 40 MG tablet TAKE 1 TABLET BY MOUTH DAILY 30 tablet 3  . spironolactone (ALDACTONE) 100 MG tablet Take 1 tablet (100 mg total) by mouth 2 (two) times daily. 60 tablet 11   No current facility-administered medications on file prior to visit.     Allergies  Allergen Reactions  . Morphine And Related Itching and Other (See Comments)    burning     Assessment/Plan:  1. Hypertension - BP now very close to goal <130/6180mmHg and much improved from 200/110 readings seen earlier this year. Will continue amlodipine 10mg  daily, spironolactone 100mg  BID, and lisinopril 10mg  daily. Rechecking BMET today since SCr had bumped with spironolactone dose increase. F/u in HTN clinic as needed.   Megan E. Supple, PharmD, CPP, BCACP [facility] [address], [place], [place] [address] Phone: [phone]; Fax: [phone]) [phone] 12/29/2017 7:24 AM

## 2017-12-29 NOTE — Patient Instructions (Signed)
It was nice to see you - your blood pressure was excellent today!  Your blood pressure goal is less than 130/80  Continue to take your medications and call clinic if you notice your readings become elevated on a regular basis

## 2017-12-30 ENCOUNTER — Other Ambulatory Visit: Payer: Self-pay | Admitting: Family Medicine

## 2018-02-09 ENCOUNTER — Other Ambulatory Visit: Payer: Self-pay | Admitting: Family Medicine

## 2018-03-15 ENCOUNTER — Other Ambulatory Visit: Payer: Self-pay | Admitting: Family Medicine

## 2018-03-15 DIAGNOSIS — I10 Essential (primary) hypertension: Secondary | ICD-10-CM

## 2018-05-31 ENCOUNTER — Other Ambulatory Visit: Payer: Self-pay | Admitting: Family Medicine

## 2018-07-20 ENCOUNTER — Other Ambulatory Visit: Payer: Self-pay | Admitting: Family Medicine

## 2018-07-20 ENCOUNTER — Telehealth: Payer: Self-pay

## 2018-07-20 DIAGNOSIS — I10 Essential (primary) hypertension: Secondary | ICD-10-CM

## 2018-07-20 NOTE — Telephone Encounter (Signed)
Called, no answer. Left a message for patient that we received a refill request for lisinopril and we need to see her before this can be sent in. Asked that she call back and schedule an appointment. Thanks!

## 2018-07-23 ENCOUNTER — Other Ambulatory Visit: Payer: Self-pay

## 2018-07-23 DIAGNOSIS — I10 Essential (primary) hypertension: Secondary | ICD-10-CM

## 2018-07-23 MED ORDER — LISINOPRIL 10 MG PO TABS: 10.0000 mg | ORAL_TABLET | Freq: Every day | ORAL | 0 refills | Status: DC

## 2018-08-02 ENCOUNTER — Ambulatory Visit (INDEPENDENT_AMBULATORY_CARE_PROVIDER_SITE_OTHER): Payer: Self-pay | Admitting: Family Medicine

## 2018-08-02 ENCOUNTER — Other Ambulatory Visit: Payer: Self-pay

## 2018-08-02 ENCOUNTER — Encounter: Payer: Self-pay | Admitting: Family Medicine

## 2018-08-02 VITALS — BP 144/82 | HR 56 | Temp 98.5°F | Resp 16 | Ht 69.0 in | Wt 159.2 lb

## 2018-08-02 DIAGNOSIS — E119 Type 2 diabetes mellitus without complications: Secondary | ICD-10-CM

## 2018-08-02 DIAGNOSIS — Z1239 Encounter for other screening for malignant neoplasm of breast: Secondary | ICD-10-CM

## 2018-08-02 DIAGNOSIS — F17200 Nicotine dependence, unspecified, uncomplicated: Secondary | ICD-10-CM

## 2018-08-02 DIAGNOSIS — I10 Essential (primary) hypertension: Secondary | ICD-10-CM

## 2018-08-02 LAB — POCT URINALYSIS DIP (MANUAL ENTRY)
Bilirubin, UA: NEGATIVE
Blood, UA: NEGATIVE
Glucose, UA: NEGATIVE mg/dL
Ketones, POC UA: NEGATIVE mg/dL
Leukocytes, UA: NEGATIVE
Nitrite, UA: NEGATIVE
Protein Ur, POC: NEGATIVE mg/dL
Spec Grav, UA: 1.01 (ref 1.010–1.025)
Urobilinogen, UA: 1 E.U./dL
pH, UA: 6 (ref 5.0–8.0)

## 2018-08-02 LAB — POCT GLYCOSYLATED HEMOGLOBIN (HGB A1C): Hemoglobin A1C: 6.7 % — AB (ref 4.0–5.6)

## 2018-08-02 LAB — GLUCOSE, POCT (MANUAL RESULT ENTRY): POC Glucose: 110 mg/dl — AB (ref 70–99)

## 2018-08-02 MED ORDER — PRAVASTATIN SODIUM 40 MG PO TABS: 40.0000 mg | ORAL_TABLET | Freq: Every day | ORAL | 3 refills | Status: DC

## 2018-08-02 MED ORDER — ISOSORBIDE MONONITRATE ER 30 MG PO TB24: 30.0000 mg | ORAL_TABLET | Freq: Every day | ORAL | 3 refills | Status: DC

## 2018-08-02 MED ORDER — METFORMIN HCL ER 500 MG PO TB24: 1000.0000 mg | ORAL_TABLET | Freq: Every day | ORAL | 5 refills | Status: DC

## 2018-08-02 MED ORDER — LISINOPRIL 10 MG PO TABS: 10.0000 mg | ORAL_TABLET | Freq: Every day | ORAL | 0 refills | Status: DC

## 2018-08-02 NOTE — Patient Instructions (Addendum)
Diabetes and Foot Care Diabetes may cause you to have problems because of poor blood supply (circulation) to your feet and legs. This may cause the skin on your feet to become thinner, break easier, and heal more slowly. Your skin may become dry, and the skin may peel and crack. You may also have nerve damage in your legs and feet causing decreased feeling in them. You may not notice minor injuries to your feet that could lead to infections or more serious problems. Taking care of your feet is one of the most important things you can do for yourself. Follow these instructions at home:  Wear shoes at all times, even in the house. Do not go barefoot. Bare feet are easily injured.  Check your feet daily for blisters, cuts, and redness. If you cannot see the bottom of your feet, use a mirror or ask someone for help.  Wash your feet with warm water (do not use hot water) and mild soap. Then pat your feet and the areas between your toes until they are completely dry. Do not soak your feet as this can dry your skin.  Apply a moisturizing lotion or petroleum jelly (that does not contain alcohol and is unscented) to the skin on your feet and to dry, brittle toenails. Do not apply lotion between your toes.  Trim your toenails straight across. Do not dig under them or around the cuticle. File the edges of your nails with an emery board or nail file.  Do not cut corns or calluses or try to remove them with medicine.  Wear clean socks or stockings every day. Make sure they are not too tight. Do not wear knee-high stockings since they may decrease blood flow to your legs.  Wear shoes that fit properly and have enough cushioning. To break in new shoes, wear them for just a few hours a day. This prevents you from injuring your feet. Always look in your shoes before you put them on to be sure there are no objects inside.  Do not cross your legs. This may decrease the blood flow to your feet.  If you find a  minor scrape, cut, or break in the skin on your feet, keep it and the skin around it clean and dry. These areas may be cleansed with mild soap and water. Do not cleanse the area with peroxide, alcohol, or iodine.  When you remove an adhesive bandage, be sure not to damage the skin around it.  If you have a wound, look at it several times a day to make sure it is healing.  Do not use heating pads or hot water bottles. They may burn your skin. If you have lost feeling in your feet or legs, you may not know it is happening until it is too late.  Make sure your health care provider performs a complete foot exam at least annually or more often if you have foot problems. Report any cuts, sores, or bruises to your health care provider immediately. Contact a health care provider if:  You have an injury that is not healing.  You have cuts or breaks in the skin.  You have an ingrown nail.  You notice redness on your legs or feet.  You feel burning or tingling in your legs or feet.  You have pain or cramps in your legs and feet.  Your legs or feet are numb.  Your feet always feel cold. Get help right away if:  There is increasing   redness, swelling, or pain in or around a wound.  There is a red line that goes up your leg.  Pus is coming from a wound.  You develop a fever or as directed by your health care provider.  You notice a bad smell coming from an ulcer or wound. This information is not intended to replace advice given to you by your health care provider. Make sure you discuss any questions you have with your health care provider. Document Released: 08/29/2000 Document Revised: 02/07/2016 Document Reviewed: 02/08/2013 Elsevier Interactive Patient Education  2017 Elsevier Inc.  Hypertension Hypertension is another name for high blood pressure. High blood pressure forces your heart to work harder to pump blood. This can cause problems over time. There are two numbers in a blood  pressure reading. There is a top number (systolic) over a bottom number (diastolic). It is best to have a blood pressure below 120/80. Healthy choices can help lower your blood pressure. You may need medicine to help lower your blood pressure if:  Your blood pressure cannot be lowered with healthy choices.  Your blood pressure is higher than 130/80.  Follow these instructions at home: Eating and drinking  If directed, follow the DASH eating plan. This diet includes: ? Filling half of your plate at each meal with fruits and vegetables. ? Filling one quarter of your plate at each meal with whole grains. Whole grains include whole wheat pasta, brown rice, and whole grain bread. ? Eating or drinking low-fat dairy products, such as skim milk or low-fat yogurt. ? Filling one quarter of your plate at each meal with low-fat (lean) proteins. Low-fat proteins include fish, skinless chicken, eggs, beans, and tofu. ? Avoiding fatty meat, cured and processed meat, or chicken with skin. ? Avoiding premade or processed food.  Eat less than 1,500 mg of salt (sodium) a day.  Limit alcohol use to no more than 1 drink a day for nonpregnant women and 2 drinks a day for men. One drink equals 12 oz of beer, 5 oz of wine, or 1 oz of hard liquor. Lifestyle  Work with your doctor to stay at a healthy weight or to lose weight. Ask your doctor what the best weight is for you.  Get at least 30 minutes of exercise that causes your heart to beat faster (aerobic exercise) most days of the week. This may include walking, swimming, or biking.  Get at least 30 minutes of exercise that strengthens your muscles (resistance exercise) at least 3 days a week. This may include lifting weights or pilates.  Do not use any products that contain nicotine or tobacco. This includes cigarettes and e-cigarettes. If you need help quitting, ask your doctor.  Check your blood pressure at home as told by your doctor.  Keep all  follow-up visits as told by your doctor. This is important. Medicines  Take over-the-counter and prescription medicines only as told by your doctor. Follow directions carefully.  Do not skip doses of blood pressure medicine. The medicine does not work as well if you skip doses. Skipping doses also puts you at risk for problems.  Ask your doctor about side effects or reactions to medicines that you should watch for. Contact a doctor if:  You think you are having a reaction to the medicine you are taking.  You have headaches that keep coming back (recurring).  You feel dizzy.  You have swelling in your ankles.  You have trouble with your vision. Get help right away  if:  You get a very bad headache.  You start to feel confused.  You feel weak or numb.  You feel faint.  You get very bad pain in your: ? Chest. ? Belly (abdomen).  You throw up (vomit) more than once.  You have trouble breathing. Summary  Hypertension is another name for high blood pressure.  Making healthy choices can help lower blood pressure. If your blood pressure cannot be controlled with healthy choices, you may need to take medicine. This information is not intended to replace advice given to you by your health care provider. Make sure you discuss any questions you have with your health care provider. Document Released: 02/18/2008 Document Revised: 07/30/2016 Document Reviewed: 07/30/2016 Elsevier Interactive Patient Education  [employer].

## 2018-08-02 NOTE — Progress Notes (Signed)
[facility]  Provider: Mike GipAndre Celester Morgan, FNP   Hypertension Follow Up Visit  SUBJECTIVE:  Amber Suarez is a 62 y.o. female who  has a past medical history of CHF (congestive heart failure) (HCC), Diabetes mellitus, Heart murmur, Hyperlipidemia, Hypertension, Myocardial infarction [facility](HCC), and Stroke (HCC). .   New concerns:  Patient is doing well with out complaints today. She continues to smoke 1/2 PPD and is not interested in quitting. Patient followed by endocrinology and heart care.   Current Outpatient Medications  Medication Sig Dispense Refill  . isosorbide mononitrate (IMDUR) 30 MG 24 hr tablet TAKE 1 TABLET BY MOUTH DAILY 30 tablet 3  . lisinopril (PRINIVIL,ZESTRIL) 10 MG tablet Take 1 tablet (10 mg total) by mouth daily. 30 tablet 0  . meloxicam (MOBIC) 7.5 MG tablet Take 7.5 mg by mouth daily.    . metFORMIN (GLUCOPHAGE-XR) 500 MG 24 hr tablet Take 2 tablets (1,000 mg total) by mouth daily. 180 tablet 5  . nitroGLYCERIN (NITROSTAT) 0.4 MG SL tablet Place 0.4 mg under the tongue every 5 (five) minutes as needed. Call 911 if no improvement after 3rd tab     . potassium chloride SA (K-DUR,KLOR-CON) 20 MEQ tablet Take 1 tablet (20 mEq total) by mouth 2 (two) times daily. 60 tablet 3  . pravastatin (PRAVACHOL) 40 MG tablet TAKE 1 TABLET BY MOUTH DAILY 30 tablet 3  . spironolactone (ALDACTONE) 100 MG tablet Take 1 tablet (100 mg total) by mouth 2 (two) times daily. 60 tablet 11  . amLODipine (NORVASC) 10 MG tablet Take 1 tablet (10 mg total) by mouth daily. 90 tablet 3  . Blood Pressure Monitoring DEVI 1 each by Does not apply route daily. 1 Device 0  . buPROPion (WELLBUTRIN XL) 150 MG 24 hr tablet Take 1 tablet (150 mg total) by mouth daily. (Patient not taking: Reported on 08/02/2018) 30 tablet 5  . nicotine (NICODERM CQ - DOSED IN MG/24 HOURS) 21 mg/24hr patch Place 1 patch (21 mg total) onto the skin daily. (Patient not  taking: Reported on 08/02/2018) 28 patch 3   No current facility-administered medications for this visit.     No results found for this or any previous visit (from the past 2160 hour(s)).  Hypertension ROS: Review of Systems  Constitutional: Negative.   HENT: Negative.   Eyes: Negative.   Respiratory: Negative.   Cardiovascular: Negative.   Gastrointestinal: Negative.   Genitourinary: Negative.   Musculoskeletal: Negative.   Skin: Negative.   Neurological: Negative.   Psychiatric/Behavioral: Negative.      OBJECTIVE:   BP (!) 163/79 (BP Location: Left Arm, Patient Position: Sitting, Cuff Size: Normal)   Pulse (!) 56   Temp 98.5 F (36.9 C) (Oral)   Resp 16   Ht 5\' 9"  (1.753 m)   Wt 159 lb 3.2 oz (72.2 kg)   SpO2 100%   BMI 23.51 kg/m   Physical Exam  Constitutional: She is oriented to person, place, and time and well-developed, well-nourished, and in no distress. No distress.  HENT:  Head: Normocephalic and atraumatic.  Eyes: Pupils are equal, round, and reactive to light. Conjunctivae and EOM are normal.  Neck: Normal range of motion. Neck supple.  Cardiovascular: Normal rate, regular rhythm and intact distal pulses. Exam reveals no gallop and no friction rub.  No murmur heard. Pulmonary/Chest: Effort normal and breath sounds normal. No respiratory distress. She has no wheezes.  Abdominal: Soft. Bowel sounds are normal. There  is no tenderness.  Musculoskeletal: Normal range of motion. She exhibits no edema or tenderness.  Lymphadenopathy:    She has no cervical adenopathy.  Neurological: She is alert and oriented to person, place, and time. Gait normal.  Skin: Skin is warm and dry.  Psychiatric: Mood, memory, affect and judgment normal.  Nursing note and vitals reviewed.    ASSESSMENT/PLAN:   1. Controlled type 2 diabetes mellitus without complication, without long-term current use of insulin (HCC) Controlled and at goal. Discussed diabetic foot care.  -  Ambulatory referral to Ophthalmology - HM Diabetes Foot Exam - pravastatin (PRAVACHOL) 40 MG tablet; Take 1 tablet (40 mg total) by mouth daily.  Dispense: 90 tablet; Refill: 3 - metFORMIN (GLUCOPHAGE-XR) 500 MG 24 hr tablet; Take 2 tablets (1,000 mg total) by mouth daily.  Dispense: 180 tablet; Refill: 5 - Basic Metabolic Panel - Lipid Panel  2. HYPERTENSION, BENIGN ESSENTIAL Continue with current medications. Patient followed by heart care for HTN.  - isosorbide mononitrate (IMDUR) 30 MG 24 hr tablet; Take 1 tablet (30 mg total) by mouth daily.  Dispense: 90 tablet; Refill: 3 - lisinopril (PRINIVIL,ZESTRIL) 10 MG tablet; Take 1 tablet (10 mg total) by mouth daily.  Dispense: 30 tablet; Refill: 0  3. Screening for breast cancer - MM Digital Screening; Future  4. Tobacco dependence Smoking cessation instruction/counseling given:  counseled patient on the dangers of tobacco use, advised patient to stop smoking, and reviewed strategies to maximize success   The patient is asked to make an attempt to improve diet and exercise patterns to aid in medical management of this problem.  Return to care as scheduled and prn. Patient verbalized understanding and agreed with plan of care.    [name]. [name], FNP-BC [facility] [address] [place], [place] [address] [phone]

## 2018-08-03 LAB — LIPID PANEL
Chol/HDL Ratio: 3.4 ratio (ref 0.0–4.4)
Cholesterol, Total: 118 mg/dL (ref 100–199)
HDL: 35 mg/dL — ABNORMAL LOW (ref >39)
LDL Calculated: 67 mg/dL (ref 0–99)
Triglycerides: 82 mg/dL (ref 0–149)
VLDL Cholesterol Cal: 16 mg/dL (ref 5–40)

## 2018-08-03 LAB — BASIC METABOLIC PANEL
BUN/Creatinine Ratio: 10 — ABNORMAL LOW (ref 12–28)
BUN: 11 mg/dL (ref 8–27)
CO2: 25 mmol/L (ref 20–29)
Calcium: 9.4 mg/dL (ref 8.7–10.3)
Chloride: 101 mmol/L (ref 96–106)
Creatinine, Ser: 1.12 mg/dL — ABNORMAL HIGH (ref 0.57–1.00)
GFR calc Af Amer: 60 mL/min/{1.73_m2} (ref >59)
GFR calc non Af Amer: 52 mL/min/{1.73_m2} — ABNORMAL LOW (ref >59)
Glucose: 92 mg/dL (ref 65–99)
Potassium: 3.7 mmol/L (ref 3.5–5.2)
Sodium: 140 mmol/L (ref 134–144)

## 2018-11-25 ENCOUNTER — Other Ambulatory Visit: Payer: Self-pay | Admitting: Family Medicine

## 2018-11-25 DIAGNOSIS — I10 Essential (primary) hypertension: Secondary | ICD-10-CM

## 2018-12-17 ENCOUNTER — Other Ambulatory Visit: Payer: Self-pay | Admitting: Family Medicine

## 2018-12-17 ENCOUNTER — Other Ambulatory Visit: Payer: Self-pay | Admitting: Cardiovascular Disease

## 2018-12-17 DIAGNOSIS — I10 Essential (primary) hypertension: Secondary | ICD-10-CM

## 2019-01-05 ENCOUNTER — Other Ambulatory Visit: Payer: Self-pay | Admitting: Endocrinology

## 2019-01-05 NOTE — Telephone Encounter (Signed)
Please advise, last OV a year ago.

## 2019-01-05 NOTE — Telephone Encounter (Signed)
Please refill x 1 F/u is due  

## 2019-01-26 ENCOUNTER — Other Ambulatory Visit: Payer: Self-pay | Admitting: Family Medicine

## 2019-01-26 DIAGNOSIS — I10 Essential (primary) hypertension: Secondary | ICD-10-CM

## 2019-01-31 ENCOUNTER — Other Ambulatory Visit: Payer: Self-pay

## 2019-01-31 ENCOUNTER — Encounter: Payer: Self-pay | Admitting: Family Medicine

## 2019-01-31 ENCOUNTER — Ambulatory Visit (INDEPENDENT_AMBULATORY_CARE_PROVIDER_SITE_OTHER): Payer: Self-pay | Admitting: Family Medicine

## 2019-01-31 VITALS — BP 142/86 | HR 68 | Temp 98.6°F | Resp 16 | Ht 69.0 in | Wt 158.0 lb

## 2019-01-31 DIAGNOSIS — I10 Essential (primary) hypertension: Secondary | ICD-10-CM

## 2019-01-31 DIAGNOSIS — E785 Hyperlipidemia, unspecified: Secondary | ICD-10-CM

## 2019-01-31 DIAGNOSIS — I252 Old myocardial infarction: Secondary | ICD-10-CM

## 2019-01-31 DIAGNOSIS — E119 Type 2 diabetes mellitus without complications: Secondary | ICD-10-CM

## 2019-01-31 LAB — POCT GLYCOSYLATED HEMOGLOBIN (HGB A1C): Hemoglobin A1C: 6.9 % — AB (ref 4.0–5.6)

## 2019-01-31 LAB — POCT URINALYSIS DIPSTICK
Bilirubin, UA: NEGATIVE
Blood, UA: NEGATIVE
Glucose, UA: NEGATIVE
Ketones, UA: NEGATIVE
Nitrite, UA: NEGATIVE
Protein, UA: NEGATIVE
Spec Grav, UA: 1.015 (ref 1.010–1.025)
Urobilinogen, UA: 2 E.U./dL — AB
pH, UA: 6.5 (ref 5.0–8.0)

## 2019-01-31 MED ORDER — LISINOPRIL 10 MG PO TABS: 10.0000 mg | ORAL_TABLET | Freq: Every day | ORAL | 1 refills | Status: DC

## 2019-01-31 NOTE — Progress Notes (Signed)
[facility]   Progress Note: General Provider: Mike Gip, FNP  SUBJECTIVE:   Amber Suarez is a 63 y.o. female who  has a past medical history of CHF (congestive heart failure) (HCC), Diabetes mellitus, Heart murmur, Hyperlipidemia, Hypertension, Myocardial infarction [facility]), and Stroke (HCC).. Patient presents today for Follow-up (6 MONTH FOLLOW UP ); Hypertension ([facility] ); Diabetes; and Hyperlipidemia   Patient presents for follow up on DM2, HTN and HLD. She reports continuing to smoke 1/2 ppd and is not interested in quitting. She does not report a carb or sodium modified diet or daily exercise. She reports compliance with medications and denies side effects.   Review of Systems  Constitutional: Negative.   HENT: Negative.   Eyes: Negative.   Respiratory: Negative.   Cardiovascular: Negative.   Gastrointestinal: Negative.   Genitourinary: Negative.   Musculoskeletal: Negative.   Skin: Negative.   Neurological: Negative.   Psychiatric/Behavioral: Negative.      OBJECTIVE: BP (!) 142/86   Pulse 68   Temp 98.6 F (37 C) (Oral)   Resp 16   Ht 5\' 9"  (1.753 m)   Wt 158 lb (71.7 kg)   SpO2 99%   BMI 23.33 kg/m   Wt Readings from Last 3 Encounters:  01/31/19 158 lb (71.7 kg)  08/02/18 159 lb 3.2 oz (72.2 kg)  11/11/17 155 lb (70.3 kg)     Physical Exam Vitals signs and nursing note reviewed.  Constitutional:      General: She is not in acute distress.    Appearance: Normal appearance.  HENT:     Head: Normocephalic and atraumatic.  Eyes:     Extraocular Movements: Extraocular movements intact.     Conjunctiva/sclera: Conjunctivae normal.     Pupils: Pupils are equal, round, and reactive to light.  Cardiovascular:     Rate and Rhythm: Normal rate and regular rhythm.     Heart sounds: No murmur.  Pulmonary:     Effort: Pulmonary effort is normal.     Breath sounds: Normal breath sounds.   Musculoskeletal: Normal range of motion.  Skin:    General: Skin is warm and dry.  Neurological:     Mental Status: She is alert and oriented to person, place, and time.  Psychiatric:        Mood and Affect: Mood normal.        Behavior: Behavior normal.        Thought Content: Thought content normal.        Judgment: Judgment normal.     ASSESSMENT/PLAN:   1. HYPERTENSION, BENIGN ESSENTIAL No medication changes warranted at the present time.   - lisinopril (ZESTRIL) 10 MG tablet; Take 1 tablet (10 mg total) by mouth daily.  Dispense: 30 tablet; Refill: 0  2. Controlled type 2 diabetes mellitus without complication, without long-term current use of insulin (HCC) No medication changes warranted at the present time.   - Urinalysis Dipstick - HgB A1c  3. History of MI (myocardial infarction) Patient is being followed by a specialist for this condition. Patient advised to continue with follow up appointments. PCP will continue to monitor progress.   4. Hyperlipidemia, unspecified hyperlipidemia type No medication changes warranted at the present time.      Return in about 6 months (around 08/03/2019) for DM, HTN, HLD.    The patient was given clear instructions to go to ER or return to medical center if symptoms do not improve, worsen  or new problems develop. The patient verbalized understanding and agreed with plan of care.   Ms. Doug Sou. Nathaneil Canary, FNP-BC [facility] [address] [place], [place] [address] [phone]

## 2019-01-31 NOTE — Patient Instructions (Addendum)
Cholesterol  Cholesterol is a fat. Your body needs a small amount of cholesterol. Cholesterol (plaque) may build up in your blood vessels (arteries). That makes you more likely to have a heart attack or stroke. You cannot feel your cholesterol level. Having a blood test is the only way to find out if your level is high. Keep your test results. Work with your doctor to keep your cholesterol at a good level. What do the results mean?  Total cholesterol is how much cholesterol is in your blood.  LDL is bad cholesterol. This is the type that can build up. Try to have low LDL.  HDL is good cholesterol. It cleans your blood vessels and carries LDL away. Try to have high HDL.  Triglycerides are fat that the body can store or burn for energy. What are good levels of cholesterol?  Total cholesterol below 200.  LDL below 100 is good for people who have health risks. LDL below 70 is good for people who have very high risks.  HDL above 40 is good. It is best to have HDL of 60 or higher.  Triglycerides below 150. How can I lower my cholesterol? Diet Follow your diet program as told by your doctor.  Choose fish, white meat chicken, or [place] that is roasted or baked. Try not to eat red meat, fried foods, sausage, or lunch meats.  Eat lots of fresh fruits and vegetables.  Choose whole grains, beans, pasta, potatoes, and cereals.  Choose olive oil, corn oil, or canola oil. Only use small amounts.  Try not to eat butter, mayonnaise, shortening, or palm kernel oils.  Try not to eat foods with trans fats.  Choose low-fat or nonfat dairy foods. ? Drink skim or nonfat milk. ? Eat low-fat or nonfat yogurt and cheeses. ? Try not to drink whole milk or cream. ? Try not to eat ice cream, egg yolks, or full-fat cheeses.  Healthy desserts include angel food cake, ginger snaps, animal crackers, hard candy, popsicles, and low-fat or nonfat frozen yogurt. Try not to eat pastries, cakes, pies, and  cookies.  Exercise Follow your exercise program as told by your doctor.  Be more active. Try gardening, walking, and taking the stairs.  Ask your doctor about ways that you can be more active. Medicine  Take over-the-counter and prescription medicines only as told by your doctor. This information is not intended to replace advice given to you by your health care provider. Make sure you discuss any questions you have with your health care provider. Document Released: 11/28/2008 Document Revised: 04/02/2016 Document Reviewed: 03/13/2016 Elsevier Interactive Patient Education  2019 [employer]. Hypertension Hypertension is another name for high blood pressure. High blood pressure forces your heart to work harder to pump blood. This can cause problems over time. There are two numbers in a blood pressure reading. There is a top number (systolic) over a bottom number (diastolic). It is best to have a blood pressure below 120/80. Healthy choices can help lower your blood pressure. You may need medicine to help lower your blood pressure if:  Your blood pressure cannot be lowered with healthy choices.  Your blood pressure is higher than 130/80. Follow these instructions at home: Eating and drinking   If directed, follow the DASH eating plan. This diet includes: ? Filling half of your plate at each meal with fruits and vegetables. ? Filling one quarter of your plate at each meal with whole grains. Whole grains include whole wheat pasta, brown rice,  and whole grain bread. ? Eating or drinking low-fat dairy products, such as skim milk or low-fat yogurt. ? Filling one quarter of your plate at each meal with low-fat (lean) proteins. Low-fat proteins include fish, skinless chicken, eggs, beans, and tofu. ? Avoiding fatty meat, cured and processed meat, or chicken with skin. ? Avoiding premade or processed food.  Eat less than 1,500 mg of salt (sodium) a day.  Limit alcohol use to no more than 1  drink a day for nonpregnant women and 2 drinks a day for men. One drink equals 12 oz of beer, 5 oz of wine, or 1 oz of hard liquor. Lifestyle  Work with your doctor to stay at a healthy weight or to lose weight. Ask your doctor what the best weight is for you.  Get at least 30 minutes of exercise that causes your heart to beat faster (aerobic exercise) most days of the week. This may include walking, swimming, or biking.  Get at least 30 minutes of exercise that strengthens your muscles (resistance exercise) at least 3 days a week. This may include lifting weights or pilates.  Do not use any products that contain nicotine or tobacco. This includes cigarettes and e-cigarettes. If you need help quitting, ask your doctor.  Check your blood pressure at home as told by your doctor.  Keep all follow-up visits as told by your doctor. This is important. Medicines  Take over-the-counter and prescription medicines only as told by your doctor. Follow directions carefully.  Do not skip doses of blood pressure medicine. The medicine does not work as well if you skip doses. Skipping doses also puts you at risk for problems.  Ask your doctor about side effects or reactions to medicines that you should watch for. Contact a doctor if:  You think you are having a reaction to the medicine you are taking.  You have headaches that keep coming back (recurring).  You feel dizzy.  You have swelling in your ankles.  You have trouble with your vision. Get help right away if:  You get a very bad headache.  You start to feel confused.  You feel weak or numb.  You feel faint.  You get very bad pain in your: ? Chest. ? Belly (abdomen).  You throw up (vomit) more than once.  You have trouble breathing. Summary  Hypertension is another name for high blood pressure.  Making healthy choices can help lower blood pressure. If your blood pressure cannot be controlled with healthy choices, you may  need to take medicine. This information is not intended to replace advice given to you by your health care provider. Make sure you discuss any questions you have with your health care provider. Document Released: 02/18/2008 Document Revised: 07/30/2016 Document Reviewed: 07/30/2016 Elsevier Interactive Patient Education  2019 Elsevier Inc. DASH Eating Plan DASH stands for "Dietary Approaches to Stop Hypertension." The DASH eating plan is a healthy eating plan that has been shown to reduce high blood pressure (hypertension). It may also reduce your risk for type 2 diabetes, heart disease, and stroke. The DASH eating plan may also help with weight loss. What are tips for following this plan?  General guidelines  Avoid eating more than 2,300 mg (milligrams) of salt (sodium) a day. If you have hypertension, you may need to reduce your sodium intake to 1,500 mg a day.  Limit alcohol intake to no more than 1 drink a day for nonpregnant women and 2 drinks a day for men.  One drink equals 12 oz of beer, 5 oz of wine, or 1 oz of hard liquor.  Work with your health care provider to maintain a healthy body weight or to lose weight. Ask what an ideal weight is for you.  Get at least 30 minutes of exercise that causes your heart to beat faster (aerobic exercise) most days of the week. Activities may include walking, swimming, or biking.  Work with your health care provider or diet and nutrition specialist (dietitian) to adjust your eating plan to your individual calorie needs. Reading food labels   Check food labels for the amount of sodium per serving. Choose foods with less than 5 percent of the Daily Value of sodium. Generally, foods with less than 300 mg of sodium per serving fit into this eating plan.  To find whole grains, look for the word "whole" as the first word in the ingredient list. Shopping  Buy products labeled as "low-sodium" or "no salt added."  Buy fresh foods. Avoid canned foods  and premade or frozen meals. Cooking  Avoid adding salt when cooking. Use salt-free seasonings or herbs instead of table salt or sea salt. Check with your health care provider or pharmacist before using salt substitutes.  Do not fry foods. Cook foods using healthy methods such as baking, boiling, grilling, and broiling instead.  Cook with heart-healthy oils, such as olive, canola, soybean, or sunflower oil. Meal planning  Eat a balanced diet that includes: ? 5 or more servings of fruits and vegetables each day. At each meal, try to fill half of your plate with fruits and vegetables. ? Up to 6-8 servings of whole grains each day. ? Less than 6 oz of lean meat, poultry, or fish each day. A 3-oz serving of meat is about the same size as a deck of cards. One egg equals 1 oz. ? 2 servings of low-fat dairy each day. ? A serving of nuts, seeds, or beans 5 times each week. ? Heart-healthy fats. Healthy fats called Omega-3 fatty acids are found in foods such as flaxseeds and coldwater fish, like sardines, salmon, and mackerel.  Limit how much you eat of the following: ? Canned or prepackaged foods. ? Food that is high in trans fat, such as fried foods. ? Food that is high in saturated fat, such as fatty meat. ? Sweets, desserts, sugary drinks, and other foods with added sugar. ? Full-fat dairy products.  Do not salt foods before eating.  Try to eat at least 2 vegetarian meals each week.  Eat more home-cooked food and less restaurant, buffet, and fast food.  When eating at a restaurant, ask that your food be prepared with less salt or no salt, if possible. What foods are recommended? The items listed may not be a complete list. Talk with your dietitian about what dietary choices are best for you. Grains Whole-grain or whole-wheat bread. Whole-grain or whole-wheat pasta. Brown rice. Orpah Cobb. Bulgur. Whole-grain and low-sodium cereals. Pita bread. Low-fat, low-sodium crackers.  Whole-wheat flour tortillas. Vegetables Fresh or frozen vegetables (raw, steamed, roasted, or grilled). Low-sodium or reduced-sodium tomato and vegetable juice. Low-sodium or reduced-sodium tomato sauce and tomato paste. Low-sodium or reduced-sodium canned vegetables. Fruits All fresh, dried, or frozen fruit. Canned fruit in natural juice (without added sugar). Meat and other protein foods Skinless chicken or [place]. Ground chicken or [place]. Pork with fat trimmed off. Fish and seafood. Egg whites. Dried beans, peas, or lentils. Unsalted nuts, nut butters, and seeds. Unsalted canned beans.  Lean cuts of beef with fat trimmed off. Low-sodium, lean deli meat. Dairy Low-fat (1%) or fat-free (skim) milk. Fat-free, low-fat, or reduced-fat cheeses. Nonfat, low-sodium ricotta or cottage cheese. Low-fat or nonfat yogurt. Low-fat, low-sodium cheese. Fats and oils Soft margarine without trans fats. Vegetable oil. Low-fat, reduced-fat, or light mayonnaise and salad dressings (reduced-sodium). Canola, safflower, olive, soybean, and sunflower oils. Avocado. Seasoning and other foods Herbs. Spices. Seasoning mixes without salt. Unsalted popcorn and pretzels. Fat-free sweets. What foods are not recommended? The items listed may not be a complete list. Talk with your dietitian about what dietary choices are best for you. Grains Baked goods made with fat, such as croissants, muffins, or some breads. Dry pasta or rice meal packs. Vegetables Creamed or fried vegetables. Vegetables in a cheese sauce. Regular canned vegetables (not low-sodium or reduced-sodium). Regular canned tomato sauce and paste (not low-sodium or reduced-sodium). Regular tomato and vegetable juice (not low-sodium or reduced-sodium). Rosita FirePickles. Olives. Fruits Canned fruit in a light or heavy syrup. Fried fruit. Fruit in cream or butter sauce. Meat and other protein foods Fatty cuts of meat. Ribs. Fried meat. Tomasa BlaseBacon. Sausage. Bologna and other  processed lunch meats. Salami. Fatback. Hotdogs. Bratwurst. Salted nuts and seeds. Canned beans with added salt. Canned or smoked fish. Whole eggs or egg yolks. Chicken or [place] with skin. Dairy Whole or 2% milk, cream, and half-and-half. Whole or full-fat cream cheese. Whole-fat or sweetened yogurt. Full-fat cheese. Nondairy creamers. Whipped toppings. Processed cheese and cheese spreads. Fats and oils Butter. Stick margarine. Lard. Shortening. Ghee. Bacon fat. Tropical oils, such as coconut, palm kernel, or palm oil. Seasoning and other foods Salted popcorn and pretzels. Onion salt, garlic salt, seasoned salt, table salt, and sea salt. Worcestershire sauce. Tartar sauce. Barbecue sauce. Teriyaki sauce. Soy sauce, including reduced-sodium. Steak sauce. Canned and packaged gravies. Fish sauce. Oyster sauce. Cocktail sauce. Horseradish that you find on the shelf. Ketchup. Mustard. Meat flavorings and tenderizers. Bouillon cubes. Hot sauce and Tabasco sauce. Premade or packaged marinades. Premade or packaged taco seasonings. Relishes. Regular salad dressings. Where to find more information:  National Heart, Lung, and Blood Institute: PopSteam.iswww.nhlbi.nih.gov  American Heart Association: www.heart.org Summary  The DASH eating plan is a healthy eating plan that has been shown to reduce high blood pressure (hypertension). It may also reduce your risk for type 2 diabetes, heart disease, and stroke.  With the DASH eating plan, you should limit salt (sodium) intake to 2,300 mg a day. If you have hypertension, you may need to reduce your sodium intake to 1,500 mg a day.  When on the DASH eating plan, aim to eat more fresh fruits and vegetables, whole grains, lean proteins, low-fat dairy, and heart-healthy fats.  Work with your health care provider or diet and nutrition specialist (dietitian) to adjust your eating plan to your individual calorie needs. This information is not intended to replace advice given to  you by your health care provider. Make sure you discuss any questions you have with your health care provider. Document Released: 08/21/2011 Document Revised: 08/25/2016 Document Reviewed: 08/25/2016 Elsevier Interactive Patient Education  2019 [employer]

## 2019-02-01 ENCOUNTER — Telehealth: Payer: Self-pay

## 2019-02-01 LAB — COMPREHENSIVE METABOLIC PANEL
ALT: 7 IU/L (ref 0–32)
AST: 12 IU/L (ref 0–40)
Albumin/Globulin Ratio: 1.5 (ref 1.2–2.2)
Albumin: 4.6 g/dL (ref 3.8–4.8)
Alkaline Phosphatase: 77 IU/L (ref 39–117)
BUN/Creatinine Ratio: 8 — ABNORMAL LOW (ref 12–28)
BUN: 11 mg/dL (ref 8–27)
Bilirubin Total: 0.3 mg/dL (ref 0.0–1.2)
CO2: 23 mmol/L (ref 20–29)
Calcium: 9.5 mg/dL (ref 8.7–10.3)
Chloride: 103 mmol/L (ref 96–106)
Creatinine, Ser: 1.37 mg/dL — ABNORMAL HIGH (ref 0.57–1.00)
GFR calc Af Amer: 47 mL/min/{1.73_m2} — ABNORMAL LOW (ref >59)
GFR calc non Af Amer: 41 mL/min/{1.73_m2} — ABNORMAL LOW (ref >59)
Globulin, Total: 3 g/dL (ref 1.5–4.5)
Glucose: 150 mg/dL — ABNORMAL HIGH (ref 65–99)
Potassium: 3.9 mmol/L (ref 3.5–5.2)
Sodium: 143 mmol/L (ref 134–144)
Total Protein: 7.6 g/dL (ref 6.0–8.5)

## 2019-02-01 MED ORDER — GLIMEPIRIDE 4 MG PO TABS
4.0000 mg | ORAL_TABLET | Freq: Every day | ORAL | 3 refills | Status: DC
Start: 2019-02-01 — End: 2019-06-24

## 2019-02-01 NOTE — Addendum Note (Signed)
Addended by: Reatha Harps on: 02/01/2019 08:45 AM   Modules accepted: Orders

## 2019-02-01 NOTE — Telephone Encounter (Signed)
Called, patient was not available. Left a message with Female for patient to call back when possible. Thanks!

## 2019-02-01 NOTE — Telephone Encounter (Signed)
-----   Message from Mike Gip, FNP sent at 02/01/2019  8:44 AM EDT ----- Please let patient know that her kidney function is decreasing. This may be due to the metformin. Please discontinue metformin and start Amaryl daily. I will send this to the pharmacy. We will repeat her labs at her next visit.

## 2019-02-01 NOTE — Telephone Encounter (Signed)
Called and spoke with patient, advised that kidney function is decreasing. Advised that this may be due to metformin and advised she needs to stop metformin and start amaryl daily as directed. Advised that we will check labs at next visit. Patient verbalized understanding. Thanks!

## 2019-03-21 ENCOUNTER — Other Ambulatory Visit: Payer: Self-pay | Admitting: Endocrinology

## 2019-04-13 ENCOUNTER — Other Ambulatory Visit: Payer: Self-pay | Admitting: Family Medicine

## 2019-05-25 ENCOUNTER — Encounter (HOSPITAL_COMMUNITY): Payer: Self-pay

## 2019-05-25 ENCOUNTER — Encounter (HOSPITAL_COMMUNITY): Payer: Self-pay | Admitting: *Deleted

## 2019-06-24 ENCOUNTER — Other Ambulatory Visit: Payer: Self-pay | Admitting: Family Medicine

## 2019-08-01 ENCOUNTER — Other Ambulatory Visit: Payer: Self-pay | Admitting: Family Medicine

## 2019-08-01 DIAGNOSIS — I10 Essential (primary) hypertension: Secondary | ICD-10-CM

## 2019-08-03 ENCOUNTER — Ambulatory Visit: Payer: No Typology Code available for payment source | Admitting: Family Medicine

## 2019-08-18 ENCOUNTER — Other Ambulatory Visit: Payer: Self-pay | Admitting: Family Medicine

## 2019-08-31 ENCOUNTER — Telehealth (INDEPENDENT_AMBULATORY_CARE_PROVIDER_SITE_OTHER): Payer: Self-pay | Admitting: Cardiovascular Disease

## 2019-08-31 ENCOUNTER — Other Ambulatory Visit: Payer: Self-pay

## 2019-08-31 ENCOUNTER — Encounter: Payer: Self-pay | Admitting: Cardiovascular Disease

## 2019-08-31 VITALS — Ht 69.0 in

## 2019-08-31 DIAGNOSIS — Z7189 Other specified counseling: Secondary | ICD-10-CM

## 2019-08-31 DIAGNOSIS — I10 Essential (primary) hypertension: Secondary | ICD-10-CM

## 2019-08-31 NOTE — Patient Instructions (Signed)
Medication Instructions:  Your physician recommends that you continue on your current medications as directed. Please refer to the Current Medication list given to you today.  *If you need a refill on your cardiac medications before your next appointment, please call your pharmacy*  Lab Work: None Ordered   Testing/Procedures: None Ordered   Follow-Up: At [facility], you and your health needs are our priority.  As part of our continuing mission to provide you with exceptional heart care, we have created designated Provider Care Teams.  These Care Teams include your primary Cardiologist (physician) and Advanced Practice Providers (APPs -  Physician Assistants and Nurse Practitioners) who all work together to provide you with the care you need, when you need it.  Your next appointment:   1 year(s)  The format for your next appointment:   In Person  Provider:   Scott Weaver, PA-C, Vin Bhagat, PA-C or Janine Hammond, NP     

## 2019-08-31 NOTE — Progress Notes (Signed)
Virtual Visit via Telephone Note   This visit type was conducted due to national recommendations for restrictions regarding the COVID-19 Pandemic (e.g. social distancing) in an effort to limit this patient's exposure and mitigate transmission in our community.  Due to her co-morbid illnesses, this patient is at least at moderate risk for complications without adequate follow up.  This format is felt to be most appropriate for this patient at this time.  The patient did not have access to video technology/had technical difficulties with video requiring transitioning to audio format only (telephone).  All issues noted in this document were discussed and addressed.  No physical exam could be performed with this format.  Please refer to the patient's chart for her  consent to telehealth for [facility].   Date:  08/31/2019   ID:  Ardis, Lawley February 25, 1956, MRN [id number]  Patient Location: Home Provider Location: Office  PCP:  Lanae Boast, [place]  Cardiologist: Nkechi Linehan  Electrophysiologist:  None    Previous History :   Problem list 1. Bradycardia 2. Essential hypertension 3. History of coronary artery disease 4. Hyperlipidemia 5. Diabetes mellitus 6. Cerebral aneurism with CVA - 1997.     February 19, 2017 Konnie Noffsinger Solimine is a 63 y.o. female who is being seen today for the evaluation of bradycardia and HTN  at the request of Dorena Dew, FNP. Seen with daughter, Izell Stewart.   She has been seen by Dr. Ellyn Hack in the past. She previously saw Dr. Rex Kras. She had a cardiac catheterization in May, 2013 which revealed nonobstructive coronary artery disease with small vessel ectasia. 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  She has not had any recent chest pain  No syncope or presyncope  She was at her medical doctors office today for routine medical checkup. She was noted have a slow heart rate and hypertension.  BP  164/84 with HR of 54.  She received clonidine 0.2 mg  orally at her medical doctors office and her blood pressure is much better.  HR has slowed further ( due to the clonidine )   February 19, 2017:  Alazay is doing much better in the Aldactone  No CP or dyspnea   Feb. 25 ,2019: BP is up.    Feels fine.   Still eating some additional salt .  Does not exercise.  Walks on occasion     Evaluation Performed:  Follow-Up Visit  Chief Complaint:  HTN    Dec. 16 , 2020   Yvetta LYNDE LUDWIG is a 63 y.o. female with history of hypertension.  She has been seen in the hypertension clinic on occasion.  I last saw her in February, 2019. No CP or dyspnea.   Feels well  Walks on occasion No VS available.   BP has been ok at other visits  Advised about smoking cessation  Smokes  1 ppd   Due to see her primary MD in Jan.    The patient does not have symptoms concerning for COVID-19 infection (fever, chills, cough, or new shortness of breath).    Past Medical History:  Diagnosis Date  . CHF (congestive heart failure) ([place])   . Diabetes mellitus   . Heart murmur   . Hyperlipidemia   . Hypertension   . Myocardial infarction ([place])   . Stroke [facility])    Past Surgical History:  Procedure Laterality Date  . ABDOMINAL HYSTERECTOMY    . angoiplasty    . LEFT HEART CATHETERIZATION WITH CORONARY  ANGIOGRAM N/A 01/28/2012   Procedure: LEFT HEART CATHETERIZATION WITH CORONARY ANGIOGRAM;  Surgeon: Marykay Lex, MD;  Location: [facility] CATH LAB;  Service: Cardiovascular;  Laterality: N/A;  . repair of aneursym  1997     No outpatient medications have been marked as taking for the 08/31/19 encounter (Telemedicine) with Avis Tirone, Deloris Ping, MD.     Allergies:   Morphine and related   Social History   Tobacco Use  . Smoking status: Current Every Day Smoker    Packs/day: 0.50    Years: 40.00    Pack years: 20.00    Types: Cigarettes  . Smokeless tobacco: Never Used  Substance Use Topics  . Alcohol use: No  . Drug use: No     Family Hx: The  patient's family history includes Diabetes in her mother; Hypertension in her father, mother, and sister.  ROS:   Please see the history of present illness.     All other systems reviewed and are negative.   Prior CV studies:   The following studies were reviewed today:    Labs/Other Tests and Data Reviewed:    EKG:  No ECG reviewed.  Recent Labs: 01/31/2019: ALT 7; BUN 11; Creatinine, Ser 1.37; Potassium 3.9; Sodium 143   Recent Lipid Panel Lab Results  Component Value Date/Time   CHOL 118 08/02/2018 09:02 AM   TRIG 82 08/02/2018 09:02 AM   HDL 35 (L) 08/02/2018 09:02 AM   CHOLHDL 3.4 08/02/2018 09:02 AM   CHOLHDL 4.7 08/15/2016 08:45 AM   LDLCALC 67 08/02/2018 09:02 AM   LDLDIRECT 133 (H) 07/14/2012 02:02 PM    Wt Readings from Last 3 Encounters:  01/31/19 158 lb (71.7 kg)  08/02/18 159 lb 3.2 oz (72.2 kg)  11/11/17 155 lb (70.3 kg)     Objective:    Vital Signs:  There were no vitals taken for this visit.    ASSESSMENT & PLAN:    1. CAD: Has history of minimal coronary artery disease by heart cath in 2013 by Dr. Herbie Baltimore.  She is not having any episodes of chest discomfort.  2.   HTN: Blood pressure has been well controlled at her other doctor's offices.  Of advised her to get a new blood pressure cuff at the drugstore and keep a record of her blood pressure. Advised smoking cessation.   Advised salt restriction .   And weight loss   3.   Hx of smoking:   Advised her to stop   4.  Hyperlipidemia :      COVID-19 Education: The signs and symptoms of COVID-19 were discussed with the patient and how to seek care for testing (follow up with PCP or arrange E-visit).  The importance of social distancing was discussed today.  Time:   Today, I have spent  18  minutes with the patient with telehealth technology discussing the above problems.     Medication Adjustments/Labs and Tests Ordered: Current medicines are reviewed at length with the patient today.   Concerns regarding medicines are outlined above.   Tests Ordered: No orders of the defined types were placed in this encounter.   Medication Changes: No orders of the defined types were placed in this encounter.   Follow Up:  In Person in 1 year(s)  Signed, Kristeen Miss, MD  08/31/2019 9:28 AM    [facility]

## 2019-09-05 ENCOUNTER — Other Ambulatory Visit: Payer: Self-pay | Admitting: Internal Medicine

## 2019-09-05 ENCOUNTER — Other Ambulatory Visit: Payer: Self-pay | Admitting: Family Medicine

## 2019-09-05 DIAGNOSIS — E119 Type 2 diabetes mellitus without complications: Secondary | ICD-10-CM

## 2019-09-12 ENCOUNTER — Other Ambulatory Visit: Payer: Self-pay | Admitting: *Deleted

## 2019-09-12 MED ORDER — SPIRONOLACTONE 100 MG PO TABS: 100.0000 mg | ORAL_TABLET | Freq: Two times a day (BID) | ORAL | 0 refills | Status: DC

## 2019-10-11 ENCOUNTER — Other Ambulatory Visit: Payer: Self-pay

## 2019-10-11 ENCOUNTER — Ambulatory Visit (INDEPENDENT_AMBULATORY_CARE_PROVIDER_SITE_OTHER): Payer: No Typology Code available for payment source | Admitting: Family Medicine

## 2019-10-11 ENCOUNTER — Encounter: Payer: Self-pay | Admitting: Family Medicine

## 2019-10-11 VITALS — BP 141/87 | HR 67 | Temp 98.3°F | Resp 16 | Ht 69.0 in | Wt 162.0 lb

## 2019-10-11 DIAGNOSIS — I10 Essential (primary) hypertension: Secondary | ICD-10-CM

## 2019-10-11 DIAGNOSIS — E785 Hyperlipidemia, unspecified: Secondary | ICD-10-CM

## 2019-10-11 DIAGNOSIS — F17200 Nicotine dependence, unspecified, uncomplicated: Secondary | ICD-10-CM

## 2019-10-11 DIAGNOSIS — E119 Type 2 diabetes mellitus without complications: Secondary | ICD-10-CM

## 2019-10-11 LAB — POCT GLYCOSYLATED HEMOGLOBIN (HGB A1C): Hemoglobin A1C: 6.9 % — AB (ref 4.0–5.6)

## 2019-10-11 LAB — POCT URINALYSIS DIPSTICK
Blood, UA: NEGATIVE
Glucose, UA: NEGATIVE
Leukocytes, UA: NEGATIVE
Nitrite, UA: NEGATIVE
Protein, UA: POSITIVE — AB
Spec Grav, UA: 1.025 (ref 1.010–1.025)
Urobilinogen, UA: 2 E.U./dL — AB
pH, UA: 5.5 (ref 5.0–8.0)

## 2019-10-11 NOTE — Progress Notes (Signed)
[facility]   Established Patient Office Visit  Subjective:  Patient ID: Amber Suarez, female    DOB: [birth year]  Age: 64 y.o. MRN: [id number]  CC:  Chief Complaint  Patient presents with  . Hypertension    sees cardiology   . Diabetes  . Follow-up   Amber Suarez, a very pleasant 64 year old female with a history significant for essential hypertension, type 2 diabetes mellitus, hyperaldosteronism, heart murmur, and tobacco dependence presents for follow-up of chronic disease.  Patient states that she has been doing well and is without complaint.  Patient states that she has not been following a consistent diet or very active signs isolating due to Covid 19 pandemic.  She continues to smoke cigarettes, she has attempted to quit in the past without success.  Hypertension This is a chronic problem. The problem is unchanged. The problem is controlled. Pertinent negatives include no anxiety, blurred vision, chest pain, headaches, orthopnea, shortness of breath or sweats. Risk factors for coronary artery disease include smoking/tobacco exposure, sedentary lifestyle and diabetes mellitus. Identifiable causes of hypertension include hyperaldosteronism.  Diabetes She has type 2 diabetes mellitus. Her disease course has been stable. Pertinent negatives for hypoglycemia include no dizziness, headaches, nervousness/anxiousness, seizures or sweats. Pertinent negatives for diabetes include no blurred vision, no chest pain, no fatigue, no polydipsia, no polyphagia and no polyuria. There are no diabetic complications. Pertinent negatives for diabetic complications include no nephropathy or peripheral neuropathy.     Past Medical History:  Diagnosis Date  . CHF (congestive heart failure) (H[facility])   . Diabetes mellitus   . Heart murmur   . Hyperlipidemia   . Hypertension   . Myocardial infarction ([facility])   . Stroke [facility])     Past Surgical History:    Procedure Laterality Date  . ABDOMINAL HYSTERECTOMY    . angoiplasty    . LEFT HEART CATHETERIZATION WITH CORONARY ANGIOGRAM N/A 01/28/2012   Procedure: LEFT HEART CATHETERIZATION WITH CORONARY ANGIOGRAM;  Surgeon: Marykay Lex, MD;  Location: [facility] CATH LAB;  Service: Cardiovascular;  Laterality: N/A;  . repair of aneursym  1997    Family History  Problem Relation Age of Onset  . Diabetes Mother   . Hypertension Mother   . Hypertension Father   . Hypertension Sister     Social History   Socioeconomic History  . Marital status: Divorced    Spouse name: Not on file  . Number of children: Not on file  . Years of education: Not on file  . Highest education level: Not on file  Occupational History  . Not on file  Tobacco Use  . Smoking status: Current Every Day Smoker    Packs/day: 0.50    Years: 40.00    Pack years: 20.00    Types: Cigarettes  . Smokeless tobacco: Never Used  Substance and Sexual Activity  . Alcohol use: No  . Drug use: No  . Sexual activity: Not Currently    Birth control/protection: Surgical  Other Topics Concern  . Not on file  Social History Narrative  . Not on file   Social Determinants of Health   Financial Resource Strain:   . Difficulty of Paying Living Expenses: Not on file  Food Insecurity:   . Worried About Programme researcher, broadcasting/film/video in the Last Year: Not on file  . Ran Out of Food in the Last Year: Not on file  Transportation Needs:   . Lack of  Transportation (Medical): Not on file  . Lack of Transportation (Non-Medical): Not on file  Physical Activity:   . Days of Exercise per Week: Not on file  . Minutes of Exercise per Session: Not on file  Stress:   . Feeling of Stress : Not on file  Social Connections:   . Frequency of Communication with Friends and Family: Not on file  . Frequency of Social Gatherings with Friends and Family: Not on file  . Attends Religious Services: Not on file  . Active Member of Clubs or Organizations: Not on  file  . Attends Banker Meetings: Not on file  . Marital Status: Not on file  Intimate Partner Violence:   . Fear of Current or Ex-Partner: Not on file  . Emotionally Abused: Not on file  . Physically Abused: Not on file  . Sexually Abused: Not on file    Outpatient Medications Prior to Visit  Medication Sig Dispense Refill  . amLODipine (NORVASC) 10 MG tablet TAKE 1 TABLET BY MOUTH DAILY 90 tablet 1  . Blood Pressure Monitoring DEVI 1 each by Does not apply route daily. 1 Device 0  . glimepiride (AMARYL) 4 MG tablet TAKE 1 TABLET (4 MG TOTAL) BY MOUTH DAILY BEFORE BREAKFAST. 30 tablet 3  . isosorbide mononitrate (IMDUR) 30 MG 24 hr tablet Take 1 tablet (30 mg total) by mouth daily. 90 tablet 3  . lisinopril (ZESTRIL) 10 MG tablet TAKE 1 TABLET (10 MG TOTAL) BY MOUTH DAILY. 30 tablet 3  . meloxicam (MOBIC) 7.5 MG tablet Take 7.5 mg by mouth as needed.     . nitroGLYCERIN (NITROSTAT) 0.4 MG SL tablet Place 0.4 mg under the tongue every 5 (five) minutes as needed. Call 911 if no improvement after 3rd tab     . potassium chloride SA (K-DUR,KLOR-CON) 20 MEQ tablet Take 1 tablet (20 mEq total) by mouth 2 (two) times daily. 60 tablet 3  . pravastatin (PRAVACHOL) 40 MG tablet TAKE 1 TABLET (40 MG TOTAL) BY MOUTH DAILY. 30 tablet 3  . spironolactone (ALDACTONE) 100 MG tablet Take 1 tablet (100 mg total) by mouth 2 (two) times daily. 60 tablet 0   No facility-administered medications prior to visit.    Allergies  Allergen Reactions  . Morphine And Related Itching and Other (See Comments)    burning    ROS Review of Systems  Constitutional: Negative for fatigue and fever.  HENT: Negative.   Eyes: Negative for blurred vision.  Respiratory: Negative.  Negative for shortness of breath.   Cardiovascular: Negative.  Negative for chest pain and orthopnea.  Gastrointestinal: Negative.   Endocrine: Negative for polydipsia, polyphagia and polyuria.  Genitourinary: Negative.     Musculoskeletal: Positive for arthralgias.  Skin: Negative.   Allergic/Immunologic: Negative.   Neurological: Negative.  Negative for dizziness, seizures and headaches.  Hematological: Negative.   Psychiatric/Behavioral: Negative.  The patient is not nervous/anxious.       Objective:    Physical Exam  Constitutional: She is oriented to person, place, and time. She appears well-developed.  HENT:  Head: Normocephalic.  Eyes: Pupils are equal, round, and reactive to light.  Cardiovascular: Normal rate and regular rhythm.  Pulmonary/Chest: Effort normal and breath sounds normal.  Abdominal: Soft. Bowel sounds are normal.  Musculoskeletal:        General: Normal range of motion.     Cervical back: Normal range of motion.  Neurological: She is alert and oriented to person, place, and time.  Skin:  Skin is warm and dry.  Psychiatric: She has a normal mood and affect. Her behavior is normal. Judgment and thought content normal.    BP (!) 141/87 (BP Location: Left Arm, Patient Position: Sitting, Cuff Size: Normal)   Pulse 67   Temp 98.3 F (36.8 C) (Oral)   Resp 16   Ht 5\' 9"  (1.753 m)   Wt 162 lb (73.5 kg)   SpO2 100%   BMI 23.92 kg/m  Wt Readings from Last 3 Encounters:  10/11/19 162 lb (73.5 kg)  01/31/19 158 lb (71.7 kg)  08/02/18 159 lb 3.2 oz (72.2 kg)     Health Maintenance Due  Topic Date Due  . OPHTHALMOLOGY EXAM  08/18/1964  . COLONOSCOPY  08/18/2004  . MAMMOGRAM  01/06/2018  . FOOT EXAM  08/03/2019  . DEXA SCAN  08/19/2019  . PNA vac Low Risk Adult (1 of 2 - PCV13) 08/19/2019    There are no preventive care reminders to display for this patient.  Lab Results  Component Value Date   TSH 1.15 01/16/2017   Lab Results  Component Value Date   WBC 4.2 01/16/2017   HGB 13.2 01/16/2017   HCT 39.6 01/16/2017   MCV 78.4 (L) 01/16/2017   PLT 139 (L) 01/16/2017   Lab Results  Component Value Date   NA 143 01/31/2019   K 3.9 01/31/2019   CO2 23  01/31/2019   GLUCOSE 150 (H) 01/31/2019   BUN 11 01/31/2019   CREATININE 1.37 (H) 01/31/2019   BILITOT 0.3 01/31/2019   ALKPHOS 77 01/31/2019   AST 12 01/31/2019   ALT 7 01/31/2019   PROT 7.6 01/31/2019   ALBUMIN 4.6 01/31/2019   CALCIUM 9.5 01/31/2019   ANIONGAP 10 01/22/2017   GFR 68.87 06/15/2017   Lab Results  Component Value Date   CHOL 118 08/02/2018   Lab Results  Component Value Date   HDL 35 (L) 08/02/2018   Lab Results  Component Value Date   LDLCALC 67 08/02/2018   Lab Results  Component Value Date   TRIG 82 08/02/2018   Lab Results  Component Value Date   CHOLHDL 3.4 08/02/2018   Lab Results  Component Value Date   HGBA1C 6.9 (A) 10/11/2019      Assessment & Plan:   Problem List Items Addressed This Visit      Cardiovascular and Mediastinum   HYPERTENSION, BENIGN ESSENTIAL - Primary   Relevant Orders   Urinalysis Dipstick (Completed)   Comprehensive metabolic panel   Ambulatory referral to Ophthalmology     Endocrine   Diabetes mellitus type 2, controlled (HCC)   Relevant Orders   Urinalysis Dipstick (Completed)   HgB A1c (Completed)   Comprehensive metabolic panel   Ambulatory referral to Ophthalmology     Other   HLD (hyperlipidemia)   Relevant Orders   Lipid Panel     1. Essential hypertension, benign - Continue medication, monitor blood pressure at home. Continue DASH diet. Reminder to go to the ER if any CP, SOB, nausea, dizziness, severe HA, changes vision/speech, left arm numbness and tingling and jaw pain.   - Urinalysis Dipstick - Comprehensive metabolic panel - Ambulatory referral to Ophthalmology - CBC with Differential  2. Controlled type 2 diabetes mellitus without complication, without long-term current use of insulin (HCC) Follow up with endocrinology as scheduled.  - Urinalysis Dipstick - HgB A1c - Comprehensive metabolic panel - Ambulatory referral to Ophthalmology - CBC with Differential  Hyperlipidemia,  unspecified hyperlipidemia type The  10-year ASCVD risk score Mikey Bussing DC Jr., et al., 2013) is: 37.9%   Values used to calculate the score:     Age: 70 years     Sex: Female     Is Non-Hispanic African American: Yes     Diabetic: Yes     Tobacco smoker: Yes     Systolic Blood Pressure: 559 mmHg     Is BP treated: Yes     HDL Cholesterol: 32 mg/dL     Total Cholesterol: 141 mg/dL - Lipid Panel  Tobacco dependence Smoking cessation instruction/counseling given:  counseled patient on the dangers of tobacco use, advised patient to stop smoking, and reviewed strategies to maximize success  Follow-up: Return in about 3 months (around 01/09/2020) for diabetes, hypertension.    Donia Pounds  APRN, MSN, FNP-C [facility] [place] [facility] [place] [address] [place], [place] [address] [phone]

## 2019-10-11 NOTE — Patient Instructions (Signed)
COVID-19 Vaccine Information can be found at: [email or web address] For questions related to vaccine distribution or appointments, please email [email or web address] or call [phone].    

## 2019-10-12 LAB — CBC WITH DIFFERENTIAL/PLATELET
Basophils Absolute: 0 10*3/uL (ref 0.0–0.2)
Basos: 1 %
EOS (ABSOLUTE): 0.1 10*3/uL (ref 0.0–0.4)
Eos: 1 %
Hematocrit: 41.2 % (ref 34.0–46.6)
Hemoglobin: 13.9 g/dL (ref 11.1–15.9)
Immature Grans (Abs): 0 10*3/uL (ref 0.0–0.1)
Immature Granulocytes: 0 %
Lymphocytes Absolute: 1.2 10*3/uL (ref 0.7–3.1)
Lymphs: 21 %
MCH: 28.9 pg (ref 26.6–33.0)
MCHC: 33.7 g/dL (ref 31.5–35.7)
MCV: 86 fL (ref 79–97)
Monocytes Absolute: 0.7 10*3/uL (ref 0.1–0.9)
Monocytes: 12 %
Neutrophils Absolute: 3.7 10*3/uL (ref 1.4–7.0)
Neutrophils: 65 %
Platelets: 166 10*3/uL (ref 150–450)
RBC: 4.81 x10E6/uL (ref 3.77–5.28)
RDW: 13.3 % (ref 11.7–15.4)
WBC: 5.6 10*3/uL (ref 3.4–10.8)

## 2019-10-12 LAB — LIPID PANEL
Chol/HDL Ratio: 4.4 ratio (ref 0.0–4.4)
Cholesterol, Total: 141 mg/dL (ref 100–199)
HDL: 32 mg/dL — ABNORMAL LOW (ref >39)
LDL Chol Calc (NIH): 89 mg/dL (ref 0–99)
Triglycerides: 105 mg/dL (ref 0–149)
VLDL Cholesterol Cal: 20 mg/dL (ref 5–40)

## 2019-10-12 LAB — COMPREHENSIVE METABOLIC PANEL
ALT: 8 IU/L (ref 0–32)
AST: 11 IU/L (ref 0–40)
Albumin/Globulin Ratio: 1.5 (ref 1.2–2.2)
Albumin: 4.5 g/dL (ref 3.8–4.8)
Alkaline Phosphatase: 85 IU/L (ref 39–117)
BUN/Creatinine Ratio: 7 — ABNORMAL LOW (ref 12–28)
BUN: 9 mg/dL (ref 8–27)
Bilirubin Total: 0.4 mg/dL (ref 0.0–1.2)
CO2: 24 mmol/L (ref 20–29)
Calcium: 9.4 mg/dL (ref 8.7–10.3)
Chloride: 104 mmol/L (ref 96–106)
Creatinine, Ser: 1.27 mg/dL — ABNORMAL HIGH (ref 0.57–1.00)
GFR calc Af Amer: 51 mL/min/{1.73_m2} — ABNORMAL LOW (ref >59)
GFR calc non Af Amer: 44 mL/min/{1.73_m2} — ABNORMAL LOW (ref >59)
Globulin, Total: 3.1 g/dL (ref 1.5–4.5)
Glucose: 115 mg/dL — ABNORMAL HIGH (ref 65–99)
Potassium: 4.1 mmol/L (ref 3.5–5.2)
Sodium: 142 mmol/L (ref 134–144)
Total Protein: 7.6 g/dL (ref 6.0–8.5)

## 2019-10-18 ENCOUNTER — Telehealth: Payer: Self-pay

## 2019-10-18 NOTE — Telephone Encounter (Signed)
-----   Message from Massie Maroon, [place] sent at 10/17/2019  8:10 PM EST ----- Regarding: lab results Please inform patient that creatine, an indicator of kidney functioning is mildly elevated. Improved from previous. Continue to monitor closely. Will repeat at scheduled follow up.  Continue medication, monitor blood pressure at home. Continue DASH diet. Reminder to go to the ER if any CP, SOB, nausea, dizziness, severe HA, changes vision/speech, left arm numbness and tingling and jaw pain.  Nolon Nations  APRN, MSN, FNP-C Patient [facility] [address] [place], [place] [address] [phone]

## 2019-10-18 NOTE — Telephone Encounter (Signed)
Called and left a message that kidney function is elevated but improving and asked that she continue to take medications as prescribed, monitor blood pressure, and eat a heart healthy diet. Asked her to go Go to ER for any serious symptoms as described by Julianne Handler, FNP. Asked if she had any questions to call our office. Thanks!

## 2019-10-20 ENCOUNTER — Other Ambulatory Visit: Payer: Self-pay | Admitting: Family Medicine

## 2019-10-20 DIAGNOSIS — I10 Essential (primary) hypertension: Secondary | ICD-10-CM

## 2019-10-20 NOTE — Telephone Encounter (Signed)
I am no longer this patient's PCP. Please send to Crystal King, NP. Thanks

## 2019-11-16 ENCOUNTER — Other Ambulatory Visit: Payer: Self-pay | Admitting: Cardiovascular Disease

## 2019-11-16 ENCOUNTER — Other Ambulatory Visit: Payer: Self-pay

## 2019-11-16 MED ORDER — GLIMEPIRIDE 4 MG PO TABS: 4.0000 mg | ORAL_TABLET | Freq: Every day | ORAL | 3 refills | Status: DC

## 2019-12-08 ENCOUNTER — Other Ambulatory Visit: Payer: Self-pay

## 2019-12-08 DIAGNOSIS — I10 Essential (primary) hypertension: Secondary | ICD-10-CM

## 2019-12-08 MED ORDER — LISINOPRIL 10 MG PO TABS: 10.0000 mg | ORAL_TABLET | Freq: Every day | ORAL | 3 refills | Status: DC

## 2020-01-10 ENCOUNTER — Ambulatory Visit: Payer: Self-pay | Admitting: Family Medicine

## 2020-01-23 ENCOUNTER — Other Ambulatory Visit: Payer: Self-pay | Admitting: Family Medicine

## 2020-01-23 DIAGNOSIS — E119 Type 2 diabetes mellitus without complications: Secondary | ICD-10-CM

## 2020-02-27 ENCOUNTER — Other Ambulatory Visit: Payer: Self-pay | Admitting: Family Medicine

## 2020-02-27 DIAGNOSIS — E119 Type 2 diabetes mellitus without complications: Secondary | ICD-10-CM

## 2020-03-08 ENCOUNTER — Other Ambulatory Visit: Payer: Self-pay

## 2020-03-08 MED ORDER — AMLODIPINE BESYLATE 10 MG PO TABS: 10.0000 mg | ORAL_TABLET | Freq: Every day | ORAL | 1 refills | Status: DC

## 2020-04-04 ENCOUNTER — Other Ambulatory Visit: Payer: Self-pay | Admitting: Nurse Practitioner

## 2020-05-04 ENCOUNTER — Emergency Department (HOSPITAL_COMMUNITY): Payer: Medicare Other

## 2020-05-04 ENCOUNTER — Other Ambulatory Visit: Payer: Self-pay

## 2020-05-04 ENCOUNTER — Emergency Department (HOSPITAL_COMMUNITY)
Admission: EM | Admit: 2020-05-04 | Discharge: 2020-05-04 | Disposition: A | Discharge status: Home / Self Care | Payer: Medicare Other | Attending: Emergency Medicine | Admitting: Emergency Medicine

## 2020-05-04 ENCOUNTER — Encounter (HOSPITAL_COMMUNITY): Payer: Self-pay | Admitting: Emergency Medicine

## 2020-05-04 DIAGNOSIS — R4182 Altered mental status, unspecified: Secondary | ICD-10-CM | POA: Diagnosis present

## 2020-05-04 DIAGNOSIS — Z7984 Long term (current) use of oral hypoglycemic drugs: Secondary | ICD-10-CM | POA: Insufficient documentation

## 2020-05-04 DIAGNOSIS — I509 Heart failure, unspecified: Secondary | ICD-10-CM | POA: Diagnosis not present

## 2020-05-04 DIAGNOSIS — I10 Essential (primary) hypertension: Secondary | ICD-10-CM | POA: Diagnosis not present

## 2020-05-04 DIAGNOSIS — E162 Hypoglycemia, unspecified: Secondary | ICD-10-CM | POA: Diagnosis not present

## 2020-05-04 DIAGNOSIS — E119 Type 2 diabetes mellitus without complications: Secondary | ICD-10-CM | POA: Diagnosis not present

## 2020-05-04 DIAGNOSIS — Z20822 Contact with and (suspected) exposure to covid-19: Secondary | ICD-10-CM | POA: Insufficient documentation

## 2020-05-04 DIAGNOSIS — Z79899 Other long term (current) drug therapy: Secondary | ICD-10-CM | POA: Insufficient documentation

## 2020-05-04 LAB — CBC WITH DIFFERENTIAL/PLATELET
Abs Immature Granulocytes: 0.02 10*3/uL (ref 0.00–0.07)
Basophils Absolute: 0 10*3/uL (ref 0.0–0.1)
Basophils Relative: 0 %
Eosinophils Absolute: 0 10*3/uL (ref 0.0–0.5)
Eosinophils Relative: 0 %
HCT: 44 % (ref 36.0–46.0)
Hemoglobin: 14.7 g/dL (ref 12.0–15.0)
Immature Granulocytes: 0 %
Lymphocytes Relative: 13 %
Lymphs Abs: 0.9 10*3/uL (ref 0.7–4.0)
MCH: 29.2 pg (ref 26.0–34.0)
MCHC: 33.4 g/dL (ref 30.0–36.0)
MCV: 87.3 fL (ref 80.0–100.0)
Monocytes Absolute: 0.5 10*3/uL (ref 0.1–1.0)
Monocytes Relative: 7 %
Neutro Abs: 5.7 10*3/uL (ref 1.7–7.7)
Neutrophils Relative %: 80 %
Platelets: 164 10*3/uL (ref 150–400)
RBC: 5.04 MIL/uL (ref 3.87–5.11)
RDW: 13.5 % (ref 11.5–15.5)
WBC: 7.1 10*3/uL (ref 4.0–10.5)
nRBC: 0 % (ref 0.0–0.2)

## 2020-05-04 LAB — CBG MONITORING, ED
Glucose-Capillary: 29 mg/dL — CL (ref 70–99)
Glucose-Capillary: 152 mg/dL — ABNORMAL HIGH (ref 70–99)
Glucose-Capillary: 137 mg/dL — ABNORMAL HIGH (ref 70–99)
Glucose-Capillary: 108 mg/dL — ABNORMAL HIGH (ref 70–99)

## 2020-05-04 LAB — SARS CORONAVIRUS 2 BY RT PCR (HOSPITAL ORDER, PERFORMED IN ~~LOC~~ HOSPITAL LAB): SARS Coronavirus 2: NEGATIVE

## 2020-05-04 LAB — HEPATIC FUNCTION PANEL
ALT: 11 U/L (ref 0–44)
AST: 16 U/L (ref 15–41)
Albumin: 4.4 g/dL (ref 3.5–5.0)
Alkaline Phosphatase: 62 U/L (ref 38–126)
Bilirubin, Direct: 0.1 mg/dL (ref 0.0–0.2)
Indirect Bilirubin: 0.2 mg/dL — ABNORMAL LOW (ref 0.3–0.9)
Total Bilirubin: 0.3 mg/dL (ref 0.3–1.2)
Total Protein: 7.9 g/dL (ref 6.5–8.1)

## 2020-05-04 LAB — URINALYSIS, ROUTINE W REFLEX MICROSCOPIC
Bacteria, UA: NONE SEEN
Bilirubin Urine: NEGATIVE
Glucose, UA: >=500 mg/dL — AB
Hgb urine dipstick: NEGATIVE
Ketones, ur: NEGATIVE mg/dL
Leukocytes,Ua: NEGATIVE
Nitrite: NEGATIVE
Protein, ur: NEGATIVE mg/dL
Specific Gravity, Urine: 1.011 (ref 1.005–1.030)
pH: 5 (ref 5.0–8.0)

## 2020-05-04 LAB — BASIC METABOLIC PANEL
Anion gap: 10 (ref 5–15)
BUN: 9 mg/dL (ref 8–23)
CO2: 23 mmol/L (ref 22–32)
Calcium: 9.2 mg/dL (ref 8.9–10.3)
Chloride: 108 mmol/L (ref 98–111)
Creatinine, Ser: 1.09 mg/dL — ABNORMAL HIGH (ref 0.44–1.00)
GFR calc Af Amer: >60 mL/min (ref >60)
GFR calc non Af Amer: 53 mL/min — ABNORMAL LOW (ref >60)
Glucose, Bld: 35 mg/dL — CL (ref 70–99)
Potassium: 3.7 mmol/L (ref 3.5–5.1)
Sodium: 141 mmol/L (ref 135–145)

## 2020-05-04 LAB — MAGNESIUM: Magnesium: 1.9 mg/dL (ref 1.7–2.4)

## 2020-05-04 LAB — LIPASE, BLOOD: Lipase: 26 U/L (ref 11–51)

## 2020-05-04 MED ORDER — DEXTROSE 50 % IV SOLN
1.0000 | Freq: Once | INTRAVENOUS | Status: AC
  Administered 2020-05-04: 50 mL via INTRAVENOUS
  Filled 2020-05-04: qty 50

## 2020-05-04 NOTE — Discharge Instructions (Addendum)
Please stop glimepiride and follow up with primary care doctor.

## 2020-05-04 NOTE — ED Provider Notes (Signed)
[facility]-EMERGENCY DEPT Provider Note   CSN: [id number] Arrival date & time: 05/04/20  2202     History Chief Complaint  Patient presents with  . AMS    Amber Suarez is a 64 y.o. female.  Patient brought here by family after being confused throughout the night.  She might have been incontinent in bed.  She is not on blood thinners.  History of stroke in the past.  However patient is walking and talking but confused.  Appears agitated per family.  Patient denies any pain.  The history is provided by the patient and a relative.  Altered Mental Status Presenting symptoms: confusion and disorientation   Severity:  Mild Most recent episode:  Today Episode history:  Single Timing:  Constant Progression:  Unchanged Chronicity:  New Context: not recent illness and not recent infection   Associated symptoms: bladder incontinence   Associated symptoms: no abdominal pain, no fever, no palpitations, no rash, no seizures and no vomiting        Past Medical History:  Diagnosis Date  . CHF (congestive heart failure) (HCC)   . Diabetes mellitus   . Heart murmur   . Hyperlipidemia   . Hypertension   . Myocardial infarction (HCC)   . Stroke [facility])     Patient Active Problem List   Diagnosis Date Noted  . Hypokalemia 03/12/2017  . Hyperaldosteronism (HCC) 01/22/2017  . 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 Acute sinus infection 02/01/2014  . Other malaise and fatigue 10/27/2013  . History of MI (myocardial infarction) 10/27/2013  . Unstable angina (HCC) 01/28/2012  . Insomnia 11/26/2010  . Diabetes mellitus type 2, controlled (HCC) 05/11/2009  . Overweight(278.02) 06/15/2008  . HYPERTENSION, BENIGN ESSENTIAL 11/20/2006  . HLD (hyperlipidemia) 11/12/2006  . TOBACCO DEPENDENCE 11/12/2006  . DEPRESSIVE DISORDER, NOS 11/12/2006  . CVA 11/12/2006    Past Surgical History:  Procedure Laterality Date  . ABDOMINAL HYSTERECTOMY    .  angoiplasty    . LEFT HEART CATHETERIZATION WITH CORONARY ANGIOGRAM N/A 01/28/2012   Procedure: LEFT HEART CATHETERIZATION WITH CORONARY ANGIOGRAM;  Surgeon: Marykay Lex, MD;  Location: [facility] CATH LAB;  Service: Cardiovascular;  Laterality: N/A;  . repair of aneursym  1997     OB History   No obstetric history on file.     Family History  Problem Relation Age of Onset  . Diabetes Mother   . Hypertension Mother   . Hypertension Father   . Hypertension Sister     Social History   Tobacco Use  . Smoking status: Current Every Day Smoker    Packs/day: 0.50    Years: 40.00    Pack years: 20.00    Types: Cigarettes  . Smokeless tobacco: Never Used  Vaping Use  . Vaping Use: Never used  Substance Use Topics  . Alcohol use: No  . Drug use: No    Home Medications Prior to Admission medications   Medication Sig Start Date End Date Taking? Authorizing Provider  amLODipine (NORVASC) 10 MG tablet Take 1 tablet (10 mg total) by mouth daily. 03/08/20   Massie Maroon, FNP  Blood Pressure Monitoring DEVI 1 each by Does not apply route daily. 02/25/17   Massie Maroon, FNP  glimepiride (AMARYL) 4 MG tablet TAKE 1 TABLET BY MOUTH DAILY BEFORE BREAKFAST 04/04/20   Barbette Merino, NP  isosorbide mononitrate (IMDUR) 30 MG 24 hr tablet TAKE 1 TABLET (30 MG TOTAL) BY MOUTH DAILY. 10/20/19  Massie Maroon, FNP  lisinopril (ZESTRIL) 10 MG tablet Take 1 tablet (10 mg total) by mouth daily. 12/08/19   Massie Maroon, FNP  meloxicam (MOBIC) 7.5 MG tablet Take 7.5 mg by mouth as needed.     [provider]  nitroGLYCERIN (NITROSTAT) 0.4 MG SL tablet Place 0.4 mg under the tongue every 5 (five) minutes as needed. Call 911 if no improvement after 3rd tab     [provider]  potassium chloride SA (K-DUR,KLOR-CON) 20 MEQ tablet Take 1 tablet (20 mEq total) by mouth 2 (two) times daily. 03/27/17   Nahser, Deloris Ping, MD  pravastatin (PRAVACHOL) 40 MG tablet TAKE 1 TABLET BY MOUTH  DAILY 02/27/20   Massie Maroon, FNP  spironolactone (ALDACTONE) 100 MG tablet TAKE 1 TABLET (100 MG TOTAL) BY MOUTH 2 (TWO) TIMES DAILY. 11/17/19   Nahser, Deloris Ping, MD    Allergies    Morphine and related  Review of Systems   Review of Systems  Constitutional: Negative for chills and fever.  HENT: Negative for ear pain and sore throat.   Eyes: Negative for pain and visual disturbance.  Respiratory: Negative for cough and shortness of breath.   Cardiovascular: Negative for chest pain and palpitations.  Gastrointestinal: Negative for abdominal pain and vomiting.  Genitourinary: Positive for bladder incontinence. Negative for dysuria and hematuria.  Musculoskeletal: Negative for arthralgias and back pain.  Skin: Negative for color change and rash.  Neurological: Negative for seizures and syncope.  Psychiatric/Behavioral: Positive for confusion.  All other systems reviewed and are negative.   Physical Exam Updated Vital Signs  ED Triage Vitals  Enc Vitals Group     BP 05/04/20 0805 (!) 145/85     Pulse Rate 05/04/20 0805 (!) 48     Resp 05/04/20 0805 14     Temp 05/04/20 0805 97.8 F (36.6 C)     Temp Source 05/04/20 0805 Oral     SpO2 05/04/20 0805 96 %     Weight --      Height --      Head Circumference --      Peak Flow --      Pain Score 05/04/20 0815 0     Pain Loc --      Pain Edu? --      Excl. in GC? --     Physical Exam Vitals and nursing note reviewed.  Constitutional:      General: She is not in acute distress.    Appearance: She is well-developed. She is not ill-appearing.  HENT:     Head: Normocephalic and atraumatic.     Nose: Nose normal.     Mouth/Throat:     Mouth: Mucous membranes are moist.  Eyes:     Conjunctiva/sclera: Conjunctivae normal.     Pupils: Pupils are equal, round, and reactive to light.  Cardiovascular:     Rate and Rhythm: Normal rate and regular rhythm.     Pulses: Normal pulses.     Heart sounds: Normal heart sounds. No  murmur heard.   Pulmonary:     Effort: Pulmonary effort is normal. No respiratory distress.     Breath sounds: Normal breath sounds.  Abdominal:     Palpations: Abdomen is soft.     Tenderness: There is no abdominal tenderness.  Musculoskeletal:     Cervical back: Normal range of motion and neck supple.  Skin:    General: Skin is warm and dry.  Capillary Refill: Capillary refill takes less than 2 seconds.  Neurological:     General: No focal deficit present.     Mental Status: She is alert.     Comments: Patient with normal gait, 5+ out of 5 strength throughout, normal sensation, appears mildly confused     ED Results / Procedures / Treatments   Labs (all labs ordered are listed, but only abnormal results are displayed) Labs Reviewed  BASIC METABOLIC PANEL - Abnormal; Notable for the following components:      Result Value   Glucose, Bld 35 (*)    Creatinine, Ser 1.09 (*)    GFR calc non Af Amer 53 (*)    All other components within normal limits  HEPATIC FUNCTION PANEL - Abnormal; Notable for the following components:   Indirect Bilirubin 0.2 (*)    All other components within normal limits  URINALYSIS, ROUTINE W REFLEX MICROSCOPIC - Abnormal; Notable for the following components:   Glucose, UA >=500 (*)    All other components within normal limits  CBG MONITORING, ED - Abnormal; Notable for the following components:   Glucose-Capillary 29 (*)    All other components within normal limits  CBG MONITORING, ED - Abnormal; Notable for the following components:   Glucose-Capillary 152 (*)    All other components within normal limits  CBG MONITORING, ED - Abnormal; Notable for the following components:   Glucose-Capillary 137 (*)    All other components within normal limits  CBG MONITORING, ED - Abnormal; Notable for the following components:   Glucose-Capillary 108 (*)    All other components within normal limits  SARS CORONAVIRUS 2 BY RT PCR (HOSPITAL ORDER, PERFORMED  IN [facility])  CBC WITH DIFFERENTIAL/PLATELET  LIPASE, BLOOD  MAGNESIUM    EKG EKG Interpretation  Date/Time:  Friday May 04 2020 08:04:17 EDT Ventricular Rate:  48 PR Interval:    QRS Duration: 114 QT Interval:  600 QTC Calculation: 537 R Axis:   3 Text Interpretation: Sinus bradycardia Borderline intraventricular conduction delay RSR' in V1 or V2, probably normal variant Nonspecific T abnormalities, diffuse leads Prolonged QT interval Confirmed by Virgina Norfolk [phone]) on 05/04/2020 8:33:12 AM   Radiology DG Chest Portable 1 View  Result Date: 05/04/2020 CLINICAL DATA:  Altered mental status EXAM: PORTABLE CHEST 1 VIEW COMPARISON:  Feb 01, 2014 FINDINGS: Lungs are clear. Heart is upper normal in size with pulmonary vascularity normal. No adenopathy. No pneumothorax. No bone lesions. IMPRESSION: Lungs clear.  Heart upper normal in size.  No adenopathy. Electronically Signed   By: Bretta Bang III M.D.   On: 05/04/2020 09:02    Procedures .Critical Care Performed by: Virgina Norfolk, DO Authorized by: Virgina Norfolk, DO   Critical care provider statement:    Critical care time (minutes):  45   Critical care was necessary to treat or prevent imminent or life-threatening deterioration of the following conditions:  Endocrine crisis   Critical care was time spent personally by me on the following activities:  Blood draw for specimens, development of treatment plan with patient or surrogate, evaluation of patient's response to treatment, examination of patient, obtaining history from patient or surrogate, ordering and performing treatments and interventions, ordering and review of laboratory studies, ordering and review of radiographic studies, pulse oximetry, re-evaluation of patient's condition and review of old charts   I assumed direction of critical care for this patient from another provider in my specialty: no     (including critical care  time)  Medications Ordered in ED Medications  dextrose 50 % solution 50 mL (50 mLs Intravenous Given 05/04/20 [id number])    ED Course  I have reviewed the triage vital signs and the nursing notes.  Pertinent labs & imaging results that were available during my care of the patient were reviewed by me and considered in my medical decision making (see chart for details).    MDM Rules/Calculators/A&P                          Cynthea Cyndy FreezeM Horiuchi is a 64 year old female with history of high blood pressure, high cholesterol, stroke, diabetes who presents the ED with altered mental status.  Patient with overall unremarkable vitals.  No fever.  Patient became confused overnight.  Brought here by family.  Appears neurologically intact initial examination.  However she does appear confused.  EKG shows sinus bradycardia at 48 with slightly prolonged QTC but otherwise unremarkable EKG.  She is not complaining of any pain.  Blood glucose check upon arrival is 29.  Will give an amp of D50.  According to chart review patient was prescribed glimepiride last month and not sure she is taking this.  Suspect that this is likely because of the low blood sugar.  No access other insulin or other medications that family is aware of.  Will check lab work and continue to monitor blood sugar.  Will look for any infectious cause.  Has been vaccinated against coronavirus.  1:06 PM on reassessment after glucose was given patient with improved mentation. She does state that she is recently started on glimepiride. Last dose was yesterday morning. We will continue to monitor.  1:06 PM patient with blood sugar now of 152.  Has been able to eat and drink without any issues since dextrose was given.  Talked with pharmacy who states that if last dose was yesterday morning medication should be mostly out of her system soon.  Patient does confirm that she took her last dose yesterday.  However did not eat much or drink much yesterday.  We will  continue to monitor and see if she can maintain blood sugar and can anticipate possible discharge if she can keep steady glucose.  1:06 PM multiple rechecks have showed stable glucose.  Patient understands to stop glimepiride and follow-up with primary care doctor.  Understands return precautions and discharged from ED in good condition.  This chart was dictated using voice recognition software.  Despite best efforts to proofread,  errors can occur which can change the documentation meaning.    Final Clinical Impression(s) / ED Diagnoses Final diagnoses:  Hypoglycemia    Rx / DC Orders ED Discharge Orders    None       Virgina NorfolkCuratolo, Brandin Dilday, DO 05/04/20 1307

## 2020-05-04 NOTE — ED Triage Notes (Signed)
Per daughter-states patient's BF called her early this am stating she wasn't acting right-states she woke up around 4 am confused, agitated-states she urinated in the bed-daughter states "she is not acting right"-she she had a stroke in the late 90's-patient states she feels fine, denies pain-orientedx2

## 2020-05-10 ENCOUNTER — Other Ambulatory Visit: Payer: Self-pay | Admitting: Family Medicine

## 2020-05-10 DIAGNOSIS — I10 Essential (primary) hypertension: Secondary | ICD-10-CM

## 2020-05-17 ENCOUNTER — Encounter: Payer: Self-pay | Admitting: Nurse Practitioner

## 2020-05-17 ENCOUNTER — Other Ambulatory Visit: Payer: Self-pay

## 2020-05-17 ENCOUNTER — Ambulatory Visit (INDEPENDENT_AMBULATORY_CARE_PROVIDER_SITE_OTHER): Payer: Medicare Other | Admitting: Nurse Practitioner

## 2020-05-17 VITALS — BP 134/87 | HR 67 | Temp 97.9°F | Ht 69.0 in | Wt 153.4 lb

## 2020-05-17 DIAGNOSIS — I10 Essential (primary) hypertension: Secondary | ICD-10-CM

## 2020-05-17 DIAGNOSIS — Z09 Encounter for follow-up examination after completed treatment for conditions other than malignant neoplasm: Secondary | ICD-10-CM | POA: Diagnosis not present

## 2020-05-17 DIAGNOSIS — Z780 Asymptomatic menopausal state: Secondary | ICD-10-CM

## 2020-05-17 DIAGNOSIS — E119 Type 2 diabetes mellitus without complications: Secondary | ICD-10-CM | POA: Diagnosis not present

## 2020-05-17 DIAGNOSIS — Z13820 Encounter for screening for osteoporosis: Secondary | ICD-10-CM | POA: Diagnosis not present

## 2020-05-17 DIAGNOSIS — Z1231 Encounter for screening mammogram for malignant neoplasm of breast: Secondary | ICD-10-CM

## 2020-05-17 DIAGNOSIS — E785 Hyperlipidemia, unspecified: Secondary | ICD-10-CM

## 2020-05-17 LAB — POCT GLYCOSYLATED HEMOGLOBIN (HGB A1C)
HbA1c POC (<> result, manual entry): 6.2 % (ref 4.0–5.6)
HbA1c, POC (controlled diabetic range): 6.2 % (ref 0.0–7.0)
HbA1c, POC (prediabetic range): 6.2 % (ref 5.7–6.4)
Hemoglobin A1C: 6.2 % — AB (ref 4.0–5.6)

## 2020-05-17 LAB — GLUCOSE, POCT (MANUAL RESULT ENTRY): POC Glucose: 134 mg/dl — AB (ref 70–99)

## 2020-05-17 NOTE — Patient Instructions (Signed)
Hypertension, Adult Hypertension is another name for high blood pressure. High blood pressure forces your heart to work harder to pump blood. This can cause problems over time. There are two numbers in a blood pressure reading. There is a top number (systolic) over a bottom number (diastolic). It is best to have a blood pressure that is below 120/80. Healthy choices can help lower your blood pressure, or you may need medicine to help lower it. What are the causes? The cause of this condition is not known. Some conditions may be related to high blood pressure. What increases the risk?  Smoking.  Having type 2 diabetes mellitus, high cholesterol, or both.  Not getting enough exercise or physical activity.  Being overweight.  Having too much fat, sugar, calories, or salt (sodium) in your diet.  Drinking too much alcohol.  Having long-term (chronic) kidney disease.  Having a family history of high blood pressure.  Age. Risk increases with age.  Race. You may be at higher risk if you are African American.  Gender. Men are at higher risk than women before age 45. After age 65, women are at higher risk than men.  Having obstructive sleep apnea.  Stress. What are the signs or symptoms?  High blood pressure may not cause symptoms. Very high blood pressure (hypertensive crisis) may cause: ? Headache. ? Feelings of worry or nervousness (anxiety). ? Shortness of breath. ? Nosebleed. ? A feeling of being sick to your stomach (nausea). ? Throwing up (vomiting). ? Changes in how you see. ? Very bad chest pain. ? Seizures. How is this treated?  This condition is treated by making healthy lifestyle changes, such as: ? Eating healthy foods. ? Exercising more. ? Drinking less alcohol.  Your health care provider may prescribe medicine if lifestyle changes are not enough to get your blood pressure under control, and if: ? Your top number is above 130. ? Your bottom number is above  80.  Your personal target blood pressure may vary. Follow these instructions at home: Eating and drinking   If told, follow the DASH eating plan. To follow this plan: ? Fill one half of your plate at each meal with fruits and vegetables. ? Fill one fourth of your plate at each meal with whole grains. Whole grains include whole-wheat pasta, brown rice, and whole-grain bread. ? Eat or drink low-fat dairy products, such as skim milk or low-fat yogurt. ? Fill one fourth of your plate at each meal with low-fat (lean) proteins. Low-fat proteins include fish, chicken without skin, eggs, beans, and tofu. ? Avoid fatty meat, cured and processed meat, or chicken with skin. ? Avoid pre-made or processed food.  Eat less than 1,500 mg of salt each day.  Do not drink alcohol if: ? Your doctor tells you not to drink. ? You are pregnant, may be pregnant, or are planning to become pregnant.  If you drink alcohol: ? Limit how much you use to:  0-1 drink a day for women.  0-2 drinks a day for men. ? Be aware of how much alcohol is in your drink. In the [place] one drink equals one 12 oz bottle of beer (355 mL), one 5 oz glass of wine (148 mL), or one 1 oz glass of hard liquor (44 mL). Lifestyle   Work with your doctor to stay at a healthy weight or to lose weight. Ask your doctor what the best weight is for you.  Get at least 30 minutes of exercise most   days of the week. This may include walking, swimming, or biking.  Get at least 30 minutes of exercise that strengthens your muscles (resistance exercise) at least 3 days a week. This may include lifting weights or doing Pilates.  Do not use any products that contain nicotine or tobacco, such as cigarettes, e-cigarettes, and chewing tobacco. If you need help quitting, ask your doctor.  Check your blood pressure at home as told by your doctor.  Keep all follow-up visits as told by your doctor. This is important. Medicines  Take over-the-counter  and prescription medicines only as told by your doctor. Follow directions carefully.  Do not skip doses of blood pressure medicine. The medicine does not work as well if you skip doses. Skipping doses also puts you at risk for problems.  Ask your doctor about side effects or reactions to medicines that you should watch for. Contact a doctor if you:  Think you are having a reaction to the medicine you are taking.  Have headaches that keep coming back (recurring).  Feel dizzy.  Have swelling in your ankles.  Have trouble with your vision. Get help right away if you:  Get a very bad headache.  Start to feel mixed up (confused).  Feel weak or numb.  Feel faint.  Have very bad pain in your: ? Chest. ? Belly (abdomen).  Throw up more than once.  Have trouble breathing. Summary  Hypertension is another name for high blood pressure.  High blood pressure forces your heart to work harder to pump blood.  For most people, a normal blood pressure is less than 120/80.  Making healthy choices can help lower blood pressure. If your blood pressure does not get lower with healthy choices, you may need to take medicine. This information is not intended to replace advice given to you by your health care provider. Make sure you discuss any questions you have with your health care provider. Document Revised: 05/12/2018 Document Reviewed: 05/12/2018 Elsevier Patient Education  2020 Elsevier Inc.  Preventing Type 2 Diabetes Mellitus Type 2 diabetes (type 2 diabetes mellitus) is a long-term (chronic) disease that affects blood sugar (glucose) levels. Normally, a hormone called insulin allows glucose to enter cells in the body. The cells use glucose for energy. In type 2 diabetes, one or both of these problems may be present:  The body does not make enough insulin.  The body does not respond properly to insulin that it makes (insulin resistance). Insulin resistance or lack of insulin  causes excess glucose to build up in the blood instead of going into cells. As a result, high blood glucose (hyperglycemia) develops, which can cause many complications. Being overweight or obese and having an inactive (sedentary) lifestyle can increase your risk for diabetes. Type 2 diabetes can be delayed or prevented by making certain nutrition and lifestyle changes. What nutrition changes can be made?   Eat healthy meals and snacks regularly. Keep a healthy snack with you for when you get hungry between meals, such as fruit or a handful of nuts.  Eat lean meats and proteins that are low in saturated fats, such as chicken, fish, egg whites, and beans. Avoid processed meats.  Eat plenty of fruits and vegetables and plenty of grains that have not been processed (whole grains). It is recommended that you eat: ? 1?2 cups of fruit every day. ? 2?3 cups of vegetables every day. ? 6?8 oz of whole grains every day, such as oats, whole wheat, bulgur, brown rice,   quinoa, and millet.  Eat low-fat dairy products, such as milk, yogurt, and cheese.  Eat foods that contain healthy fats, such as nuts, avocado, olive oil, and canola oil.  Drink water throughout the day. Avoid drinks that contain added sugar, such as soda or sweet tea.  Follow instructions from your health care provider about specific eating or drinking restrictions.  Control how much food you eat at a time (portion size). ? Check food labels to find out the serving sizes of foods. ? Use a kitchen scale to weigh amounts of foods.  Saute or steam food instead of frying it. Cook with water or broth instead of oils or butter.  Limit your intake of: ? Salt (sodium). Have no more than 1 tsp (2,400 mg) of sodium a day. If you have heart disease or high blood pressure, have less than ? tsp (1,500 mg) of sodium a day. ? Saturated fat. This is fat that is solid at room temperature, such as butter or fat on meat. What lifestyle changes can  be made? Activity   Do moderate-intensity physical activity for at least 30 minutes on at least 5 days of the week, or as much as told by your health care provider.  Ask your health care provider what activities are safe for you. A mix of physical activities may be best, such as walking, swimming, cycling, and strength training.  Try to add physical activity into your day. For example: ? Park in spots that are farther away than usual, so that you walk more. For example, park in a far corner of the parking lot when you go to the office or the grocery store. ? Take a walk during your lunch break. ? Use stairs instead of elevators or escalators. Weight Loss  Lose weight as directed. Your health care provider can determine how much weight loss is best for you and can help you lose weight safely.  If you are overweight or obese, you may be instructed to lose at least 5?7 % of your body weight. Alcohol and Tobacco   Limit alcohol intake to no more than 1 drink a day for nonpregnant women and 2 drinks a day for men. One drink equals 12 oz of beer, 5 oz of wine, or 1 oz of hard liquor.  Do not use any tobacco products, such as cigarettes, chewing tobacco, and e-cigarettes. If you need help quitting, ask your health care provider. Work With Your Health Care Provider  Have your blood glucose tested regularly, as told by your health care provider.  Discuss your risk factors and how you can reduce your risk for diabetes.  Get screening tests as told by your health care provider. You may have screening tests regularly, especially if you have certain risk factors for type 2 diabetes.  Make an appointment with a diet and nutrition specialist (registered dietitian). A registered dietitian can help you make a healthy eating plan and can help you understand portion sizes and food labels. Why are these changes important?  It is possible to prevent or delay type 2 diabetes and related health problems  by making lifestyle and nutrition changes.  It can be difficult to recognize signs of type 2 diabetes. The best way to avoid possible damage to your body is to take actions to prevent the disease before you develop symptoms. What can happen if changes are not made?  Your blood glucose levels may keep increasing. Having high blood glucose for a long time is dangerous.   Too much glucose in your blood can damage your blood vessels, heart, kidneys, nerves, and eyes.  You may develop prediabetes or type 2 diabetes. Type 2 diabetes can lead to many chronic health problems and complications, such as: ? Heart disease. ? Stroke. ? Blindness. ? Kidney disease. ? Depression. ? Poor circulation in the feet and legs, which could lead to surgical removal (amputation) in severe cases. Where to find support  Ask your health care provider to recommend a registered dietitian, diabetes educator, or weight loss program.  Look for local or online weight loss groups.  Join a gym, fitness club, or outdoor activity group, such as a walking club. Where to find more information To learn more about diabetes and diabetes prevention, visit:  American Diabetes Association (ADA): www.diabetes.org  National Institute of Diabetes and Digestive and Kidney Diseases: www.niddk.nih.gov/health-information/diabetes To learn more about healthy eating, visit:  The U.S. Department of Agriculture (USDA), Choose My Plate: www.choosemyplate.gov/food-groups  Office of Disease Prevention and Health Promotion (ODPHP), Dietary Guidelines: www.health.gov/dietaryguidelines Summary  You can reduce your risk for type 2 diabetes by increasing your physical activity, eating healthy foods, and losing weight as directed.  Talk with your health care provider about your risk for type 2 diabetes. Ask about any blood tests or screening tests that you need to have. This information is not intended to replace advice given to you by your  health care provider. Make sure you discuss any questions you have with your health care provider. Document Revised: 12/24/2018 Document Reviewed: 10/23/2015 Elsevier Patient Education  2020 Elsevier Inc.  

## 2020-05-17 NOTE — Progress Notes (Signed)
[facility] Patient [facility] [address] [place], [place]  [address] Phone:  [phone]   Fax:  [phone]   Established Patient Office Visit  Subjective:  Patient ID: Amber Suarez, female    DOB: April 09, 1956  Age: 64 y.o. MRN: [id number]  CC:  Chief Complaint  Patient presents with   Follow-up    Pt states about 2wks ago she went to the hospital for low blood sugar.    HPI Amber Suarez presents for hospital follow-up. She  has a past medical history of CHF (congestive heart failure) (HCC), Diabetes mellitus, Heart murmur, Hyperlipidemia, Hypertension, Myocardial infarction [facility]), and Stroke (HCC).   On 820/2021 she was seen in the emergency room for hypoglycemia.  She has a history of diabetes was taken glimepiride 4 mg daily. He last fu visit was in Jan 2021.  On admission to ED her blood glucose was 29. She was confused however not exhibiting any stroke symptoms.  She was treated with an amp of D50.  She was alert and oriented and blood sugar on discharge was 152. She had not eaten that day. She admits that she has a small appetite. She no longer taking the glimepiride.   Hypertension Patient is here for follow-up of elevated blood pressure. She is not exercising and is not adherent to a low-salt diet. Blood pressure is not  monitored at home. Cardiac symptoms: none. Patient denies chest pain, dyspnea, exertional chest pressure/discomfort, irregular heart beat, lower extremity edema, palpitations and syncope. Cardiovascular risk factors: advanced age (older than 82 for men, 44 for women), diabetes mellitus, dyslipidemia, hypertension and sedentary lifestyle. Use of agents associated with hypertension: none. History of target organ damage: angina/ prior myocardial infarction and stroke.   Past Medical History:  Diagnosis Date   CHF (congestive heart failure) (HCC)    Diabetes mellitus    Heart murmur    Hyperlipidemia    Hypertension    Myocardial infarction  [facility])    Stroke [facility])     Past Surgical History:  Procedure Laterality Date   ABDOMINAL HYSTERECTOMY     angoiplasty     LEFT HEART CATHETERIZATION WITH CORONARY ANGIOGRAM N/A 01/28/2012   Procedure: LEFT HEART CATHETERIZATION WITH CORONARY ANGIOGRAM;  Surgeon: Marykay Lex, MD;  Location: [facility] CATH LAB;  Service: Cardiovascular;  Laterality: N/A;   repair of aneursym  1997    Family History  Problem Relation Age of Onset   Diabetes Mother    Hypertension Mother    Hypertension Father    Hypertension Sister     Social History   Socioeconomic History   Marital status: Divorced    Spouse name: Not on file   Number of children: Not on file   Years of education: Not on file   Highest education level: Not on file  Occupational History   Not on file  Tobacco Use   Smoking status: Current Every Day Smoker    Packs/day: 0.50    Years: 40.00    Pack years: 20.00    Types: Cigarettes   Smokeless tobacco: Never Used  Vaping Use   Vaping Use: Never used  Substance and Sexual Activity   Alcohol use: No   Drug use: No   Sexual activity: Not Currently    Birth control/protection: Surgical  Other Topics Concern   Not on file  Social History Narrative   Not on file   Social Determinants of Health   Financial Resource Strain:  Difficulty of Paying Living Expenses: Not on file  Food Insecurity:    Worried About Running Out of Food in the Last Year: Not on file   Ran Out of Food in the Last Year: Not on file  Transportation Needs:    Lack of Transportation (Medical): Not on file   Lack of Transportation (Non-Medical): Not on file  Physical Activity:    Days of Exercise per Week: Not on file   Minutes of Exercise per Session: Not on file  Stress:    Feeling of Stress : Not on file  Social Connections:    Frequency of Communication with Friends and Family: Not on file   Frequency of Social Gatherings with Friends and Family: Not on file     Attends Religious Services: Not on file   Active Member of Clubs or Organizations: Not on file   Attends Banker Meetings: Not on file   Marital Status: Not on file  Intimate Partner Violence:    Fear of Current or Ex-Partner: Not on file   Emotionally Abused: Not on file   Physically Abused: Not on file   Sexually Abused: Not on file    Outpatient Medications Prior to Visit  Medication Sig Dispense Refill   amLODipine (NORVASC) 10 MG tablet Take 1 tablet (10 mg total) by mouth daily. 90 tablet 1   Blood Pressure Monitoring DEVI 1 each by Does not apply route daily. 1 Device 0   isosorbide mononitrate (IMDUR) 30 MG 24 hr tablet TAKE 1 TABLET (30 MG TOTAL) BY MOUTH DAILY. 90 tablet 3   lisinopril (ZESTRIL) 10 MG tablet Take 1 tablet (10 mg total) by mouth daily. 30 tablet 3   meloxicam (MOBIC) 7.5 MG tablet Take 7.5 mg by mouth as needed.      potassium chloride SA (K-DUR,KLOR-CON) 20 MEQ tablet Take 1 tablet (20 mEq total) by mouth 2 (two) times daily. 60 tablet 3   pravastatin (PRAVACHOL) 40 MG tablet TAKE 1 TABLET BY MOUTH DAILY 30 tablet 4   spironolactone (ALDACTONE) 100 MG tablet TAKE 1 TABLET (100 MG TOTAL) BY MOUTH 2 (TWO) TIMES DAILY. 180 tablet 2   nitroGLYCERIN (NITROSTAT) 0.4 MG SL tablet Place 0.4 mg under the tongue every 5 (five) minutes as needed. Call 911 if no improvement after 3rd tab  (Patient not taking: Reported on 05/17/2020)     No facility-administered medications prior to visit.    Allergies  Allergen Reactions   Morphine And Related Itching and Other (See Comments)    burning    ROS Review of Systems  Constitutional: Positive for appetite change.       Decreased       Objective:    Physical Exam Constitutional:      General: She is not in acute distress.    Appearance: She is not ill-appearing, toxic-appearing or diaphoretic.  HENT:     Head: Normocephalic and atraumatic.  Cardiovascular:     Rate and Rhythm:  Normal rate and regular rhythm.     Pulses: Normal pulses.     Heart sounds: Normal heart sounds.  Pulmonary:     Effort: Pulmonary effort is normal.  Abdominal:     Palpations: Abdomen is soft.  Musculoskeletal:        General: Normal range of motion.     Cervical back: Normal range of motion.  Skin:    General: Skin is warm.     Capillary Refill: Capillary refill takes less than 2 seconds.  Neurological:     General: No focal deficit present.     Mental Status: She is alert and oriented to person, place, and time.  Psychiatric:        Mood and Affect: Mood normal.        Behavior: Behavior normal.        Thought Content: Thought content normal.        Judgment: Judgment normal.     BP 134/87 (BP Location: Left Arm, Patient Position: Sitting, Cuff Size: Normal)    Pulse 67    Temp 97.9 F (36.6 C)    Ht 5\' 9"  (1.753 m)    Wt 153 lb 6.4 oz (69.6 kg)    BMI 22.65 kg/m  Wt Readings from Last 3 Encounters:  05/17/20 153 lb 6.4 oz (69.6 kg)  05/04/20 148 lb (67.1 kg)  10/11/19 162 lb (73.5 kg)     Health Maintenance Due  Topic Date Due   OPHTHALMOLOGY EXAM  Never done   MAMMOGRAM  01/06/2018    There are no preventive care reminders to display for this patient.  Lab Results  Component Value Date   TSH 1.15 01/16/2017   Lab Results  Component Value Date   WBC 5.5 05/17/2020   HGB 13.9 05/17/2020   HCT 41.3 05/17/2020   MCV 87 05/17/2020   PLT 158 05/17/2020   Lab Results  Component Value Date   NA 141 05/17/2020   K 4.2 05/17/2020   CO2 23 05/04/2020   GLUCOSE 138 (H) 05/17/2020   BUN 11 05/17/2020   CREATININE 1.19 (H) 05/17/2020   BILITOT 0.4 05/17/2020   ALKPHOS 79 05/17/2020   AST 12 05/17/2020   ALT 11 05/04/2020   PROT 7.4 05/17/2020   ALBUMIN 4.7 05/17/2020   CALCIUM 9.5 05/17/2020   ANIONGAP 10 05/04/2020   GFR 68.87 06/15/2017   Lab Results  Component Value Date   CHOL 126 05/17/2020   Lab Results  Component Value Date   HDL 30 (L)  05/17/2020   Lab Results  Component Value Date   LDLCALC 77 05/17/2020   Lab Results  Component Value Date   TRIG 100 05/17/2020   Lab Results  Component Value Date   CHOLHDL 4.2 05/17/2020   Lab Results  Component Value Date   HGBA1C 6.2 (A) 05/17/2020   HGBA1C 6.2 05/17/2020   HGBA1C 6.2 05/17/2020   HGBA1C 6.2 05/17/2020      Assessment & Plan:   Problem List Items Addressed This Visit      Cardiovascular and Mediastinum   HYPERTENSION, BENIGN ESSENTIAL Encouraged on going compliance with current medication regimen Encouraged home monitoring and recording BP <130/80 Eating a heart-healthy diet with less salt Encouraged regular physical activity  Recommend Weight loss    Relevant Orders   CBC with Differential/Platelet (Completed)     Endocrine   Diabetes mellitus type 2, controlled (HCC) Encourage compliance and proper diet with medications. Medications on hold at this time.  Encourage regular CBG monitoring Encourage contacting office if excessive hyperglycemia and or hypoglycemia Lifestyle modification with healthy diet (fewer calories, more high fiber foods, whole grains and non-starchy vegetables, lower fat meat and fish, low-fat diary include healthy oils) regular exercise (physical activity) and weight loss Opthalmology exam discussed  Nutritional consult recommended Regular dental visits encouraged Home BP monitoring also encouraged goal <130/80     Relevant Orders   Comp. Metabolic Panel (12) (Completed)   POC HgB A1c (Completed)     Other  HLD (hyperlipidemia)   Relevant Orders   Lipid panel (Completed)    Other Visit Diagnoses    Follow up    -  Primary   Relevant Orders   POC Glucose (CBG) (Completed)   Encounter for screening mammogram for malignant neoplasm of breast       Relevant Orders   MM Digital Screening   Encounter for osteoporosis screening in asymptomatic postmenopausal patient       Relevant Orders   HM DEXA SCAN  (Completed)      No orders of the defined types were placed in this encounter.   Follow-up: Return in about 3 months (around 08/16/2020).    Barbette Merinorystal M Mylena Sedberry, NP

## 2020-05-18 LAB — COMP. METABOLIC PANEL (12)
AST: 12 IU/L (ref 0–40)
Albumin/Globulin Ratio: 1.7 (ref 1.2–2.2)
Albumin: 4.7 g/dL (ref 3.8–4.8)
Alkaline Phosphatase: 79 IU/L (ref 48–121)
BUN/Creatinine Ratio: 9 — ABNORMAL LOW (ref 12–28)
BUN: 11 mg/dL (ref 8–27)
Bilirubin Total: 0.4 mg/dL (ref 0.0–1.2)
Calcium: 9.5 mg/dL (ref 8.7–10.3)
Chloride: 101 mmol/L (ref 96–106)
Creatinine, Ser: 1.19 mg/dL — ABNORMAL HIGH (ref 0.57–1.00)
GFR calc Af Amer: 55 mL/min/{1.73_m2} — ABNORMAL LOW (ref >59)
GFR calc non Af Amer: 48 mL/min/{1.73_m2} — ABNORMAL LOW (ref >59)
Globulin, Total: 2.7 g/dL (ref 1.5–4.5)
Glucose: 138 mg/dL — ABNORMAL HIGH (ref 65–99)
Potassium: 4.2 mmol/L (ref 3.5–5.2)
Sodium: 141 mmol/L (ref 134–144)
Total Protein: 7.4 g/dL (ref 6.0–8.5)

## 2020-05-18 LAB — CBC WITH DIFFERENTIAL/PLATELET
Basophils Absolute: 0 10*3/uL (ref 0.0–0.2)
Basos: 0 %
EOS (ABSOLUTE): 0.1 10*3/uL (ref 0.0–0.4)
Eos: 2 %
Hematocrit: 41.3 % (ref 34.0–46.6)
Hemoglobin: 13.9 g/dL (ref 11.1–15.9)
Immature Grans (Abs): 0 10*3/uL (ref 0.0–0.1)
Immature Granulocytes: 0 %
Lymphocytes Absolute: 1.2 10*3/uL (ref 0.7–3.1)
Lymphs: 22 %
MCH: 29.2 pg (ref 26.6–33.0)
MCHC: 33.7 g/dL (ref 31.5–35.7)
MCV: 87 fL (ref 79–97)
Monocytes Absolute: 0.5 10*3/uL (ref 0.1–0.9)
Monocytes: 9 %
Neutrophils Absolute: 3.7 10*3/uL (ref 1.4–7.0)
Neutrophils: 67 %
Platelets: 158 10*3/uL (ref 150–450)
RBC: 4.76 x10E6/uL (ref 3.77–5.28)
RDW: 13.8 % (ref 11.7–15.4)
WBC: 5.5 10*3/uL (ref 3.4–10.8)

## 2020-05-18 LAB — LIPID PANEL
Chol/HDL Ratio: 4.2 ratio (ref 0.0–4.4)
Cholesterol, Total: 126 mg/dL (ref 100–199)
HDL: 30 mg/dL — ABNORMAL LOW (ref >39)
LDL Chol Calc (NIH): 77 mg/dL (ref 0–99)
Triglycerides: 100 mg/dL (ref 0–149)
VLDL Cholesterol Cal: 19 mg/dL (ref 5–40)

## 2020-06-01 ENCOUNTER — Ambulatory Visit: Payer: Medicare Other

## 2020-06-05 ENCOUNTER — Ambulatory Visit
Admission: RE | Admit: 2020-06-05 | Discharge: 2020-06-05 | Disposition: A | Discharge status: Home / Self Care | Payer: Medicare Other | Source: Ambulatory Visit | Attending: Nurse Practitioner | Admitting: Nurse Practitioner

## 2020-06-05 ENCOUNTER — Other Ambulatory Visit: Payer: Self-pay

## 2020-06-05 DIAGNOSIS — Z1231 Encounter for screening mammogram for malignant neoplasm of breast: Secondary | ICD-10-CM

## 2020-08-16 ENCOUNTER — Ambulatory Visit: Payer: Medicare Other | Admitting: Nurse Practitioner

## 2020-08-17 ENCOUNTER — Other Ambulatory Visit: Payer: Self-pay | Admitting: Family Medicine

## 2020-08-17 DIAGNOSIS — E119 Type 2 diabetes mellitus without complications: Secondary | ICD-10-CM

## 2020-08-21 NOTE — Telephone Encounter (Signed)
Please see refill request.

## 2020-09-11 ENCOUNTER — Other Ambulatory Visit: Payer: Self-pay | Admitting: Family Medicine

## 2020-10-19 ENCOUNTER — Other Ambulatory Visit: Payer: Self-pay | Admitting: Family Medicine

## 2020-10-19 DIAGNOSIS — E119 Type 2 diabetes mellitus without complications: Secondary | ICD-10-CM

## 2020-10-19 DIAGNOSIS — I10 Essential (primary) hypertension: Secondary | ICD-10-CM

## 2020-10-19 NOTE — Telephone Encounter (Signed)
Please see med reqest

## 2020-10-29 ENCOUNTER — Telehealth: Payer: Self-pay | Admitting: *Deleted

## 2020-10-29 NOTE — Telephone Encounter (Signed)
  Patient Consent for Virtual Visit         Amber Suarez has provided verbal consent on 10/29/2020 for a virtual visit (video or telephone).   CONSENT FOR VIRTUAL VISIT FOR:  Amber Suarez  By participating in this virtual visit I agree to the following:  I hereby voluntarily request, consent and authorize [facility] and its employed or contracted physicians, physician assistants, nurse practitioners or other licensed health care professionals (the Practitioner), to provide me with telemedicine health care services (the "Services") as deemed necessary by the treating Practitioner. I acknowledge and consent to receive the Services by the Practitioner via telemedicine. I understand that the telemedicine visit will involve communicating with the Practitioner through live audiovisual communication technology and the disclosure of certain medical information by electronic transmission. I acknowledge that I have been given the opportunity to request an in-person assessment or other available alternative prior to the telemedicine visit and am voluntarily participating in the telemedicine visit.  I understand that I have the right to withhold or withdraw my consent to the use of telemedicine in the course of my care at any time, without affecting my right to future care or treatment, and that the Practitioner or I may terminate the telemedicine visit at any time. I understand that I have the right to inspect all information obtained and/or recorded in the course of the telemedicine visit and may receive copies of available information for a reasonable fee.  I understand that some of the potential risks of receiving the Services via telemedicine include:  Marland Kitchen Delay or interruption in medical evaluation due to technological equipment failure or disruption; . Information transmitted may not be sufficient (e.g. poor resolution of images) to allow for appropriate medical decision making by the  Practitioner; and/or  . In rare instances, security protocols could fail, causing a breach of personal health information.  Furthermore, I acknowledge that it is my responsibility to provide information about my medical history, conditions and care that is complete and accurate to the best of my ability. I acknowledge that Practitioner's advice, recommendations, and/or decision may be based on factors not within their control, such as incomplete or inaccurate data provided by me or distortions of diagnostic images or specimens that may result from electronic transmissions. I understand that the practice of medicine is not an exact science and that Practitioner makes no warranties or guarantees regarding treatment outcomes. I acknowledge that a copy of this consent can be made available to me via my patient portal [facility] MyChart), or I can request a printed copy by calling the office of [facility].    I understand that my insurance will be billed for this visit.   I have read or had this consent read to me. . I understand the contents of this consent, which adequately explains the benefits and risks of the Services being provided via telemedicine.  . I have been provided ample opportunity to ask questions regarding this consent and the Services and have had my questions answered to my satisfaction. . I give my informed consent for the services to be provided through the use of telemedicine in my medical care

## 2020-10-30 ENCOUNTER — Telehealth (INDEPENDENT_AMBULATORY_CARE_PROVIDER_SITE_OTHER): Payer: Medicare Other | Admitting: Physician Assistant

## 2020-10-30 ENCOUNTER — Encounter: Payer: Self-pay | Admitting: Physician Assistant

## 2020-10-30 ENCOUNTER — Ambulatory Visit (INDEPENDENT_AMBULATORY_CARE_PROVIDER_SITE_OTHER): Payer: Medicare Other | Admitting: Family Medicine

## 2020-10-30 ENCOUNTER — Encounter: Payer: Self-pay | Admitting: Family Medicine

## 2020-10-30 ENCOUNTER — Other Ambulatory Visit: Payer: Self-pay

## 2020-10-30 VITALS — BP 144/89 | HR 56 | Temp 97.2°F | Ht 69.0 in | Wt 150.0 lb

## 2020-10-30 VITALS — BP 144/89 | HR 56 | Ht 69.0 in | Wt 150.0 lb

## 2020-10-30 DIAGNOSIS — E782 Mixed hyperlipidemia: Secondary | ICD-10-CM

## 2020-10-30 DIAGNOSIS — I10 Essential (primary) hypertension: Secondary | ICD-10-CM | POA: Diagnosis not present

## 2020-10-30 DIAGNOSIS — E785 Hyperlipidemia, unspecified: Secondary | ICD-10-CM | POA: Diagnosis not present

## 2020-10-30 DIAGNOSIS — E119 Type 2 diabetes mellitus without complications: Secondary | ICD-10-CM

## 2020-10-30 DIAGNOSIS — I2510 Atherosclerotic heart disease of native coronary artery without angina pectoris: Secondary | ICD-10-CM

## 2020-10-30 DIAGNOSIS — F17200 Nicotine dependence, unspecified, uncomplicated: Secondary | ICD-10-CM | POA: Diagnosis not present

## 2020-10-30 LAB — POCT URINALYSIS DIPSTICK
Bilirubin, UA: NEGATIVE
Blood, UA: NEGATIVE
Glucose, UA: POSITIVE — AB
Ketones, UA: NEGATIVE
Leukocytes, UA: NEGATIVE
Nitrite, UA: NEGATIVE
Protein, UA: NEGATIVE
Spec Grav, UA: 1.025 (ref 1.010–1.025)
Urobilinogen, UA: 0.2 E.U./dL
pH, UA: 6 (ref 5.0–8.0)

## 2020-10-30 LAB — POCT CBG (FASTING - GLUCOSE)-MANUAL ENTRY: Glucose Fasting, POC: 148 mg/dL — AB (ref 70–99)

## 2020-10-30 MED ORDER — ASPIRIN EC 81 MG PO TBEC: 81.0000 mg | DELAYED_RELEASE_TABLET | Freq: Every day | ORAL | 3 refills | Status: DC

## 2020-10-30 NOTE — Patient Instructions (Signed)
Medication Instructions:  Your physician has recommended you make the following change in your medication:  1.  Check with your Primary Care Dr to see if it's ok you start Aspirin 81 mg 1 tablet daily   *If you need a refill on your cardiac medications before your next appointment, please call your pharmacy*   Lab Work: None ordered  If you have labs (blood work) drawn today and your tests are completely normal, you will receive your results only by: Marland Kitchen MyChart Message (if you have MyChart) OR . A paper copy in the mail If you have any lab test that is abnormal or we need to change your treatment, we will call you to review the results.   Testing/Procedures: None ordered   Follow-Up: At [facility], you and your health needs are our priority.  As part of our continuing mission to provide you with exceptional heart care, we have created designated Provider Care Teams.  These Care Teams include your primary Cardiologist (physician) and Advanced Practice Providers (APPs -  Physician Assistants and Nurse Practitioners) who all work together to provide you with the care you need, when you need it.  We recommend signing up for the patient portal called "MyChart".  Sign up information is provided on this After Visit Summary.  MyChart is used to connect with patients for Virtual Visits (Telemedicine).  Patients are able to view lab/test results, encounter notes, upcoming appointments, etc.  Non-urgent messages can be sent to your provider as well.   To learn more about what you can do with MyChart, go to [email or web address].    Your next appointment:   12 month(s)  The format for your next appointment:   In Person  Provider:   You may see Kristeen Miss, MD or one of the following Advanced Practice Providers on your designated Care Team:    Tereso Newcomer, PA-C  Chelsea Aus, [place]    Other Instructions

## 2020-10-30 NOTE — Patient Instructions (Addendum)
-We will follow up by phone with all laboratory results.  - Continue medication, monitor blood pressure at home. Continue DASH diet. Reminder to go to the ER if any CP, SOB, nausea, dizziness, severe HA, changes vision/speech, left arm numbness and tingling and jaw pain. -Recommend smoking cessation.  -We will see you in 3 months    Hypertension, Adult Hypertension is another name for high blood pressure. High blood pressure forces your heart to work harder to pump blood. This can cause problems over time. There are two numbers in a blood pressure reading. There is a top number (systolic) over a bottom number (diastolic). It is best to have a blood pressure that is below 120/80. Healthy choices can help lower your blood pressure, or you may need medicine to help lower it. What are the causes? The cause of this condition is not known. Some conditions may be related to high blood pressure. What increases the risk?  Smoking.  Having type 2 diabetes mellitus, high cholesterol, or both.  Not getting enough exercise or physical activity.  Being overweight.  Having too much fat, sugar, calories, or salt (sodium) in your diet.  Drinking too much alcohol.  Having long-term (chronic) kidney disease.  Having a family history of high blood pressure.  Age. Risk increases with age.  Race. You may be at higher risk if you are African American.  Gender. Men are at higher risk than women before age 16. After age 37, women are at higher risk than men.  Having obstructive sleep apnea.  Stress. What are the signs or symptoms?  High blood pressure may not cause symptoms. Very high blood pressure (hypertensive crisis) may cause: ? Headache. ? Feelings of worry or nervousness (anxiety). ? Shortness of breath. ? Nosebleed. ? A feeling of being sick to your stomach (nausea). ? Throwing up (vomiting). ? Changes in how you see. ? Very bad chest pain. ? Seizures. How is this  treated?  This condition is treated by making healthy lifestyle changes, such as: ? Eating healthy foods. ? Exercising more. ? Drinking less alcohol.  Your health care provider may prescribe medicine if lifestyle changes are not enough to get your blood pressure under control, and if: ? Your top number is above 130. ? Your bottom number is above 80.  Your personal target blood pressure may vary. Follow these instructions at home: Eating and drinking  If told, follow the DASH eating plan. To follow this plan: ? Fill one half of your plate at each meal with fruits and vegetables. ? Fill one fourth of your plate at each meal with whole grains. Whole grains include whole-wheat pasta, brown rice, and whole-grain bread. ? Eat or drink low-fat dairy products, such as skim milk or low-fat yogurt. ? Fill one fourth of your plate at each meal with low-fat (lean) proteins. Low-fat proteins include fish, chicken without skin, eggs, beans, and tofu. ? Avoid fatty meat, cured and processed meat, or chicken with skin. ? Avoid pre-made or processed food.  Eat less than 1,500 mg of salt each day.  Do not drink alcohol if: ? Your doctor tells you not to drink. ? You are pregnant, may be pregnant, or are planning to become pregnant.  If you drink alcohol: ? Limit how much you use to:  0-1 drink a day for women.  0-2 drinks a day for men. ? Be aware of how much alcohol is in your drink. In the [place] one drink equals one 12 oz  bottle of beer (355 mL), one 5 oz glass of wine (148 mL), or one 1 oz glass of hard liquor (44 mL).   Lifestyle  Work with your doctor to stay at a healthy weight or to lose weight. Ask your doctor what the best weight is for you.  Get at least 30 minutes of exercise most days of the week. This may include walking, swimming, or biking.  Get at least 30 minutes of exercise that strengthens your muscles (resistance exercise) at least 3 days a week. This may include lifting  weights or doing Pilates.  Do not use any products that contain nicotine or tobacco, such as cigarettes, e-cigarettes, and chewing tobacco. If you need help quitting, ask your doctor.  Check your blood pressure at home as told by your doctor.  Keep all follow-up visits as told by your doctor. This is important.   Medicines  Take over-the-counter and prescription medicines only as told by your doctor. Follow directions carefully.  Do not skip doses of blood pressure medicine. The medicine does not work as well if you skip doses. Skipping doses also puts you at risk for problems.  Ask your doctor about side effects or reactions to medicines that you should watch for. Contact a doctor if you:  Think you are having a reaction to the medicine you are taking.  Have headaches that keep coming back (recurring).  Feel dizzy.  Have swelling in your ankles.  Have trouble with your vision. Get help right away if you:  Get a very bad headache.  Start to feel mixed up (confused).  Feel weak or numb.  Feel faint.  Have very bad pain in your: ? Chest. ? Belly (abdomen).  Throw up more than once.  Have trouble breathing. Summary  Hypertension is another name for high blood pressure.  High blood pressure forces your heart to work harder to pump blood.  For most people, a normal blood pressure is less than 120/80.  Making healthy choices can help lower blood pressure. If your blood pressure does not get lower with healthy choices, you may need to take medicine. This information is not intended to replace advice given to you by your health care provider. Make sure you discuss any questions you have with your health care provider. Document Revised: 05/12/2018 Document Reviewed: 05/12/2018 Elsevier Patient Education  2021 [employer].

## 2020-10-30 NOTE — Progress Notes (Signed)
[facility]     Subjective:  Patient ID: Amber Suarez, female    DOB: 10/26/1955  Age: 65 y.o. MRN: [id number]  CC:  Chief Complaint  Patient presents with  . Follow-up    Follow up  htn , and diabetes     HPI  Amber Suarez is a 65 year old female with a medical history significant for type 2 diabetes mellitus, essential hypertension, hyperlipidemia, and history of CVA presents for follow-up of chronic conditions.  Patient states that she is doing well and does not have any complaints on today.  She has been taking all prescribed medications consistently.  Patient had an episode of hypoglycemia in August 2021, at that time glimepiride was discontinued.  Patient has been managing diabetes without antidiabetic medications over the past 6 months.  She says that she mostly follows a low-fat, low carbohydrate diet divided over small meals.  Patient does not exercise. Patient is a chronic everyday tobacco user.  She is not interested in smoking cessation at this time.  She typically smokes 1 pack/day.  Diabetes She has type 2 diabetes mellitus. Her disease course has been stable. Hypoglycemia symptoms include sweats. Pertinent negatives for hypoglycemia include no dizziness or headaches. Pertinent negatives for diabetes include no blurred vision, no chest pain, no fatigue, no foot paresthesias, no polydipsia, no polyphagia, no polyuria, no visual change, no weakness and no weight loss. Symptoms are stable. Diabetic complications include a CVA. Risk factors for coronary artery disease include diabetes mellitus, dyslipidemia and hypertension. Current diabetic treatment includes diet. She is compliant with treatment all of the time. When asked about meal planning, she reported none. She has not had a previous visit with a dietitian. She participates in exercise daily. There is no change in her home blood glucose trend. She does not see a  podiatrist.Eye exam is current.  Hypertension This is a chronic problem. The problem is unchanged. The problem is controlled. Associated symptoms include sweats. Pertinent negatives include no blurred vision, chest pain, headaches, orthopnea or shortness of breath. There are no associated agents to hypertension. Risk factors for coronary artery disease include dyslipidemia. There are no compliance problems.  Hypertensive end-organ damage includes CVA. There is no history of kidney disease or CAD/MI.    Past Medical History:  Diagnosis Date  . CHF (congestive heart failure) (HCC)   . Diabetes mellitus   . Heart murmur   . Hyperlipidemia   . Hypertension   . Myocardial infarction (HCC)   . Stroke [facility])     Past Surgical History:  Procedure Laterality Date  . ABDOMINAL HYSTERECTOMY    . angoiplasty    . LEFT HEART CATHETERIZATION WITH CORONARY ANGIOGRAM N/A 01/28/2012   Procedure: LEFT HEART CATHETERIZATION WITH CORONARY ANGIOGRAM;  Surgeon: Marykay Lex, MD;  Location: [facility] CATH LAB;  Service: Cardiovascular;  Laterality: N/A;  . repair of aneursym  1997    Family History  Problem Relation Age of Onset  . Diabetes Mother   . Hypertension Mother   . Hypertension Father   . Hypertension Sister     Social History   Socioeconomic History  . Marital status: Divorced    Spouse name: Not on file  . Number of children: Not on file  . Years of education: Not on file  . Highest education level: Not on file  Occupational History  . Not on file  Tobacco Use  . Smoking status: Current Every Day  Smoker    Packs/day: 0.50    Years: 40.00    Pack years: 20.00    Types: Cigarettes  . Smokeless tobacco: Never Used  Vaping Use  . Vaping Use: Never used  Substance and Sexual Activity  . Alcohol use: No  . Drug use: No  . Sexual activity: Not Currently    Birth control/protection: Surgical  Other Topics Concern  . Not on file  Social History Narrative  . Not on file   Social  Determinants of Health   Financial Resource Strain: Not on file  Food Insecurity: Not on file  Transportation Needs: Not on file  Physical Activity: Not on file  Stress: Not on file  Social Connections: Not on file  Intimate Partner Violence: Not on file    Outpatient Medications Prior to Visit  Medication Sig Dispense Refill  . amLODipine (NORVASC) 10 MG tablet TAKE 1 TABLET (10 MG TOTAL) BY MOUTH DAILY. 90 tablet 1  . Blood Pressure Monitoring DEVI 1 each by Does not apply route daily. 1 Device 0  . isosorbide mononitrate (IMDUR) 30 MG 24 hr tablet TAKE 1 TABLET BY MOUTH DAILY 30 tablet 1  . lisinopril (ZESTRIL) 10 MG tablet TAKE 1 TABLET BY MOUTH DAILY 30 tablet 3  . meloxicam (MOBIC) 7.5 MG tablet Take 7.5 mg by mouth as needed.     . nitroGLYCERIN (NITROSTAT) 0.4 MG SL tablet Place 0.4 mg under the tongue every 5 (five) minutes as needed. Call 911 if no improvement after 3rd tab    . potassium chloride SA (K-DUR,KLOR-CON) 20 MEQ tablet Take 1 tablet (20 mEq total) by mouth 2 (two) times daily. 60 tablet 3  . pravastatin (PRAVACHOL) 40 MG tablet TAKE 1 TABLET BY MOUTH DAILY 30 tablet 4  . spironolactone (ALDACTONE) 100 MG tablet TAKE 1 TABLET (100 MG TOTAL) BY MOUTH 2 (TWO) TIMES DAILY. 180 tablet 2   No facility-administered medications prior to visit.    Allergies  Allergen Reactions  . Morphine And Related Itching and Other (See Comments)    burning    ROS Review of Systems  Constitutional: Negative for activity change, appetite change, fatigue and weight loss.  HENT: Negative.   Eyes: Negative.  Negative for blurred vision.  Respiratory: Negative.  Negative for shortness of breath.   Cardiovascular: Negative.  Negative for chest pain and orthopnea.  Gastrointestinal: Negative.   Endocrine: Negative for polydipsia, polyphagia and polyuria.  Genitourinary: Negative.  Negative for decreased urine volume and frequency.  Musculoskeletal: Negative.   Skin: Negative.    Allergic/Immunologic: Negative.   Neurological: Negative.  Negative for dizziness, weakness and headaches.  Hematological: Negative.   Psychiatric/Behavioral: Negative.       Objective:    Physical Exam Constitutional:      Appearance: Normal appearance.  Eyes:     Pupils: Pupils are equal, round, and reactive to light.  Cardiovascular:     Rate and Rhythm: Normal rate and regular rhythm.     Pulses: Normal pulses.  Pulmonary:     Effort: Pulmonary effort is normal.  Abdominal:     General: Bowel sounds are normal.  Skin:    General: Skin is warm.  Neurological:     General: No focal deficit present.     Mental Status: She is alert. Mental status is at baseline.  Psychiatric:        Mood and Affect: Mood normal.        Behavior: Behavior normal.  Thought Content: Thought content normal.        Judgment: Judgment normal.     Ht 5\' 9"  (1.753 m)   Wt 150 lb (68 kg)   BMI 22.15 kg/m  Wt Readings from Last 3 Encounters:  10/30/20 150 lb (68 kg)  05/17/20 153 lb 6.4 oz (69.6 kg)  05/04/20 148 lb (67.1 kg)     Health Maintenance Due  Topic Date Due  . OPHTHALMOLOGY EXAM  Never done  . COVID-19 Vaccine (3 - Booster for Pfizer series) 05/25/2020    There are no preventive care reminders to display for this patient.  Lab Results  Component Value Date   TSH 1.15 01/16/2017   Lab Results  Component Value Date   WBC 5.5 05/17/2020   HGB 13.9 05/17/2020   HCT 41.3 05/17/2020   MCV 87 05/17/2020   PLT 158 05/17/2020   Lab Results  Component Value Date   NA 141 05/17/2020   K 4.2 05/17/2020   CO2 23 05/04/2020   GLUCOSE 138 (H) 05/17/2020   BUN 11 05/17/2020   CREATININE 1.19 (H) 05/17/2020   BILITOT 0.4 05/17/2020   ALKPHOS 79 05/17/2020   AST 12 05/17/2020   ALT 11 05/04/2020   PROT 7.4 05/17/2020   ALBUMIN 4.7 05/17/2020   CALCIUM 9.5 05/17/2020   ANIONGAP 10 05/04/2020   GFR 68.87 06/15/2017   Lab Results  Component Value Date   CHOL  126 05/17/2020   Lab Results  Component Value Date   HDL 30 (L) 05/17/2020   Lab Results  Component Value Date   LDLCALC 77 05/17/2020   Lab Results  Component Value Date   TRIG 100 05/17/2020   Lab Results  Component Value Date   CHOLHDL 4.2 05/17/2020   Lab Results  Component Value Date   HGBA1C 6.2 (A) 05/17/2020   HGBA1C 6.2 05/17/2020   HGBA1C 6.2 05/17/2020   HGBA1C 6.2 05/17/2020      Assessment & Plan:   Problem List Items Addressed This Visit      Cardiovascular and Mediastinum   HYPERTENSION, BENIGN ESSENTIAL - Primary   Relevant Orders   Urinalysis Dipstick   Comprehensive metabolic panel     Endocrine   Diabetes mellitus type 2, controlled (HCC)   Relevant Orders   Urinalysis Dipstick   Comprehensive metabolic panel   Glucose (CBG), Fasting (Completed)   Hemoglobin A1c     Other   TOBACCO DEPENDENCE   HLD (hyperlipidemia)   Relevant Orders   Lipid Panel    Other Visit Diagnoses    Tobacco dependence         1. Essential hypertension, benign Blood pressure is at goal.  Goal is less than 150/90.  Continue all antihypertensive medications as previously prescribed.  No changes warranted. BP (!) 144/89   Pulse (!) 56   Temp (!) 97.2 F (36.2 C) (Temporal)   Ht 5\' 9"  (1.753 m)   Wt 150 lb (68 kg)   SpO2 97%   BMI 22.15 kg/m  - Urinalysis Dipstick - Comprehensive metabolic panel  2. Tobacco dependence Smoking cessation instruction/counseling given:  counseled patient on the dangers of tobacco use, advised patient to stop smoking, and reviewed strategies to maximize success  3. Hyperlipidemia, unspecified hyperlipidemia type We will review laboratory results as they become available.  Recommend low-fat, low carbohydrate diet divided over small meals throughout the day. - Lipid Panel  4. Controlled type 2 diabetes mellitus without complication, without long-term current use of  insulin (HCC) Previous hemoglobin A1c was 6.2.  We will  follow-up as laboratory values become available.  No antidiabetic medication warranted at this time.  Will continue to manage with carbohydrate modified diet. - Urinalysis Dipstick - Comprehensive metabolic panel - Glucose (CBG), Fasting - Hemoglobin A1c   Follow-up: Return in about 3 months (around 01/27/2021) for diabetes, hypertension.    Nolon Nations  APRN, MSN, FNP-C [facility] [address] [place], [place] [address] [phone]

## 2020-10-30 NOTE — Progress Notes (Signed)
Virtual Visit via Telephone Note   This visit type was conducted due to national recommendations for restrictions regarding the COVID-19 Pandemic (e.g. social distancing) in an effort to limit this patient's exposure and mitigate transmission in our community.  Due to her co-morbid illnesses, this patient is at least at moderate risk for complications without adequate follow up.  This format is felt to be most appropriate for this patient at this time.  The patient did not have access to video technology/had technical difficulties with video requiring transitioning to audio format only (telephone).  All issues noted in this document were discussed and addressed.  No physical exam could be performed with this format.  Please refer to the patient's chart for her  consent to telehealth for [facility].    Date:  10/30/2020   ID:  Amber Suarez, DOB 1955/12/21, MRN [id number] The patient was identified using 2 identifiers.  Patient Location: Home Provider Location: Home Office   PCP:  Massie Maroon, FNP   [facility]  Cardiologist:  Kristeen Miss, [id number]}   Evaluation Performed:  Follow-Up Visit  Chief Complaint:  Yearly follow up   History of Present Illness:    Amber Suarez is a 65 y.o. female with microvascular angina, CAD, hypertension, hyperlipidemia, bradycardia and cerebral aneurysm with CVA in 1997 seen for follow-up.  Cardiac catheterization May 2013 showed nonobstructive CAD with small vessel ectasia.  Felt patient has microvascular angina.  History of bradycardia.  Seen virtually for follow-up.  Patient reported she was placed on aspirin after her cardiac catheterization but it was "discontinued by some provider".  She does not remember the reason.  She does have history of cerebral aneurysm with CVA in 1997 but cardiac cath was in 2013.  Patient denies bleeding issue.  No chest pain, shortness of breath, dizziness, palpitation,  orthopnea, PND, syncope or melena.  Blood pressure minimally elevated this morning but has not took her medication yet.  The patient does not have symptoms concerning for COVID-19 infection (fever, chills, cough, or new shortness of breath).    Past Medical History:  Diagnosis Date  . CHF (congestive heart failure) (HCC)   . Diabetes mellitus   . Heart murmur   . Hyperlipidemia   . Hypertension   . Myocardial infarction (HCC)   . Stroke [facility])    Past Surgical History:  Procedure Laterality Date  . ABDOMINAL HYSTERECTOMY    . angoiplasty    . LEFT HEART CATHETERIZATION WITH CORONARY ANGIOGRAM N/A 01/28/2012   Procedure: LEFT HEART CATHETERIZATION WITH CORONARY ANGIOGRAM;  Surgeon: Marykay Lex, MD;  Location: [facility] CATH LAB;  Service: Cardiovascular;  Laterality: N/A;  . repair of aneursym  1997     Current Meds  Medication Sig  . amLODipine (NORVASC) 10 MG tablet TAKE 1 TABLET (10 MG TOTAL) BY MOUTH DAILY.  Marland Kitchen aspirin EC 81 MG tablet Take 1 tablet (81 mg total) by mouth daily. Swallow whole.  . Blood Pressure Monitoring DEVI 1 each by Does not apply route daily.  . isosorbide mononitrate (IMDUR) 30 MG 24 hr tablet TAKE 1 TABLET BY MOUTH DAILY  . lisinopril (ZESTRIL) 10 MG tablet TAKE 1 TABLET BY MOUTH DAILY  . meloxicam (MOBIC) 7.5 MG tablet Take 7.5 mg by mouth as needed for pain.  . nitroGLYCERIN (NITROSTAT) 0.4 MG SL tablet Place 0.4 mg under the tongue every 5 (five) minutes as needed. Call 911 if no improvement after 3rd tab  . potassium  chloride SA (K-DUR,KLOR-CON) 20 MEQ tablet Take 1 tablet (20 mEq total) by mouth 2 (two) times daily.  . pravastatin (PRAVACHOL) 40 MG tablet TAKE 1 TABLET BY MOUTH DAILY  . spironolactone (ALDACTONE) 100 MG tablet TAKE 1 TABLET (100 MG TOTAL) BY MOUTH 2 (TWO) TIMES DAILY.     Allergies:   Morphine and related   Social History   Tobacco Use  . Smoking status: Current Every Day Smoker    Packs/day: 0.50    Years: 40.00    Pack years:  20.00    Types: Cigarettes  . Smokeless tobacco: Never Used  Vaping Use  . Vaping Use: Never used  Substance Use Topics  . Alcohol use: No  . Drug use: No     Family Hx: The patient's family history includes Diabetes in her mother; Hypertension in her father, mother, and sister.  ROS:   Please see the history of present illness.    All other systems reviewed and are negative.   Prior CV studies:   The following studies were reviewed today:  Echo 01/2017 Left ventricle: The cavity size was normal. There was mild  concentric hypertrophy. Systolic function was mildly reduced. The  estimated ejection fraction was in the range of 45% to 50%. There  is hypokinesis of the basal inferior and inferolateral walls.  Features are consistent with a pseudonormal left ventricular  filling pattern, with concomitant abnormal relaxation and  increased filling pressure (grade 2 diastolic dysfunction). There  was no evidence of elevated ventricular filling pressure by  Doppler parameters.  - Aortic valve: Trileaflet; normal thickness leaflets. There was  trivial regurgitation.  - Aortic root: The aortic root was normal in size.  - Mitral valve: There was mild regurgitation.  - Left atrium: The atrium was mildly dilated.  - Right ventricle: The cavity size was normal. Wall thickness was  normal. Systolic function was normal.  - Right atrium: The atrium was normal in size.  - Tricuspid valve: There was mild regurgitation.  - Pulmonic valve: There was no regurgitation.  - Pulmonary arteries: Systolic pressure was moderately increased.  PA peak pressure: 46 mm Hg (S).  - Inferior vena cava: The vessel was normal in size.  - Pericardium, extracardiac: There was no pericardial effusion.   Labs/Other Tests and Data Reviewed:    EKG:  No ECG reviewed.  Recent Labs: 05/04/2020: ALT 11; Magnesium 1.9 05/17/2020: BUN 11; Creatinine, Ser 1.19; Hemoglobin 13.9; Platelets 158;  Potassium 4.2; Sodium 141   Recent Lipid Panel Lab Results  Component Value Date/Time   CHOL 126 05/17/2020 02:24 PM   TRIG 100 05/17/2020 02:24 PM   HDL 30 (L) 05/17/2020 02:24 PM   CHOLHDL 4.2 05/17/2020 02:24 PM   CHOLHDL 4.7 08/15/2016 08:45 AM   LDLCALC 77 05/17/2020 02:24 PM   LDLDIRECT 133 (H) 07/14/2012 02:02 PM    Wt Readings from Last 3 Encounters:  10/30/20 150 lb (68 kg)  10/30/20 150 lb (68 kg)  05/17/20 153 lb 6.4 oz (69.6 kg)     Objective:    Vital Signs:  BP (!) 144/89   Pulse (!) 56   Ht 5\' 9"  (1.753 m)   Wt 150 lb (68 kg)   BMI 22.15 kg/m    VITAL SIGNS:  reviewed GEN:  no acute distress PSYCH:  normal affect  ASSESSMENT & PLAN:    1. CAD No angina.  She was taking aspirin after her cardiac catheterization in 2013 but  discontinued at by "some  provider" many years ago. She does have history of cerebral aneurysm with CVA in 1997.  Patient denies any neurological symptoms or bleeding issue.  Recommended to start aspirin 81 mg daily after discussion with PCP. -Continue statin and Imdur -No beta-blocker given history of bradycardia  2.  Hypertension -Minimally elevated this morning however has not took her medication yet -Discussed goal of blood pressure less than 130/80 -No change in therapy today  3.  Hyperlipidemia -Continue statin -Followed by PCP        COVID-19 Education: The signs and symptoms of COVID-19 were discussed with the patient and how to seek care for testing (follow up with PCP or arrange E-visit).  The importance of social distancing was discussed today.  Time:   Today, I have spent 12 minutes with the patient with telehealth technology discussing the above problems.     Medication Adjustments/Labs and Tests Ordered: Current medicines are reviewed at length with the patient today.  Concerns regarding medicines are outlined above.   Tests Ordered: No orders of the defined types were placed in this  encounter.   Medication Changes: Meds ordered this encounter  Medications  . aspirin EC 81 MG tablet    Sig: Take 1 tablet (81 mg total) by mouth daily. Swallow whole.    Dispense:  90 tablet    Refill:  3    Follow Up:  In Person in 1 year(s)  Signed, Manson Passey, [place]  10/30/2020 10:10 AM    [facility]

## 2020-10-31 ENCOUNTER — Other Ambulatory Visit: Payer: Self-pay | Admitting: Family Medicine

## 2020-10-31 DIAGNOSIS — E119 Type 2 diabetes mellitus without complications: Secondary | ICD-10-CM

## 2020-10-31 LAB — COMPREHENSIVE METABOLIC PANEL
ALT: 6 IU/L (ref 0–32)
AST: 9 IU/L (ref 0–40)
Albumin/Globulin Ratio: 1.5 (ref 1.2–2.2)
Albumin: 4.5 g/dL (ref 3.8–4.8)
Alkaline Phosphatase: 87 IU/L (ref 44–121)
BUN/Creatinine Ratio: 9 — ABNORMAL LOW (ref 12–28)
BUN: 11 mg/dL (ref 8–27)
Bilirubin Total: 0.3 mg/dL (ref 0.0–1.2)
CO2: 22 mmol/L (ref 20–29)
Calcium: 9.6 mg/dL (ref 8.7–10.3)
Chloride: 101 mmol/L (ref 96–106)
Creatinine, Ser: 1.22 mg/dL — ABNORMAL HIGH (ref 0.57–1.00)
GFR calc Af Amer: 53 mL/min/{1.73_m2} — ABNORMAL LOW (ref >59)
GFR calc non Af Amer: 46 mL/min/{1.73_m2} — ABNORMAL LOW (ref >59)
Globulin, Total: 3 g/dL (ref 1.5–4.5)
Glucose: 133 mg/dL — ABNORMAL HIGH (ref 65–99)
Potassium: 4.5 mmol/L (ref 3.5–5.2)
Sodium: 139 mmol/L (ref 134–144)
Total Protein: 7.5 g/dL (ref 6.0–8.5)

## 2020-10-31 LAB — LIPID PANEL
Chol/HDL Ratio: 4.2 ratio (ref 0.0–4.4)
Cholesterol, Total: 130 mg/dL (ref 100–199)
HDL: 31 mg/dL — ABNORMAL LOW (ref >39)
LDL Chol Calc (NIH): 72 mg/dL (ref 0–99)
Triglycerides: 152 mg/dL — ABNORMAL HIGH (ref 0–149)
VLDL Cholesterol Cal: 27 mg/dL (ref 5–40)

## 2020-10-31 LAB — HEMOGLOBIN A1C
Est. average glucose Bld gHb Est-mCnc: 157 mg/dL
Hgb A1c MFr Bld: 7.1 % — ABNORMAL HIGH (ref 4.8–5.6)

## 2020-10-31 MED ORDER — GLIMEPIRIDE 4 MG PO TABS: 2.0000 mg | ORAL_TABLET | Freq: Every day | ORAL | 3 refills | Status: DC

## 2020-10-31 NOTE — Progress Notes (Unsigned)
Amber Suarez is a 65 year old female with a medical history significant for type 2 diabetes mellitus, hyperlipidemia, and essential hypertension was evaluated in clinic on 10/30/2020. At that time, hemoglobin A1c was found to be increased from previous at 7.1.  Will restart glimepiride 2 mg daily. Check fasting blood sugars prior to breakfast.  Bring glucometer to follow-up appointment.  Triglycerides are elevated at 152, goal is less than 150.  We will continue pravastatin as previously prescribed. Continue all medications as previously prescribed. Recommend a low carbohydrate diet divided over small meals throughout the day. Follow-up in clinic as scheduled.  At that time, will repeat hemoglobin A1c.    Nolon Nations  APRN, MSN, FNP-C Patient Care [facility] [address] [place], [place] [address] [phone]

## 2020-11-15 ENCOUNTER — Other Ambulatory Visit: Payer: Self-pay | Admitting: Family Medicine

## 2020-11-15 DIAGNOSIS — I10 Essential (primary) hypertension: Secondary | ICD-10-CM

## 2020-11-19 ENCOUNTER — Other Ambulatory Visit: Payer: Self-pay | Admitting: Family Medicine

## 2020-11-19 DIAGNOSIS — I10 Essential (primary) hypertension: Secondary | ICD-10-CM

## 2020-11-19 NOTE — Telephone Encounter (Signed)
Please see refill request.

## 2020-12-13 ENCOUNTER — Other Ambulatory Visit: Payer: Self-pay | Admitting: Cardiovascular Disease

## 2020-12-13 ENCOUNTER — Other Ambulatory Visit: Payer: Self-pay | Admitting: Family Medicine

## 2020-12-13 DIAGNOSIS — I10 Essential (primary) hypertension: Secondary | ICD-10-CM

## 2021-01-29 ENCOUNTER — Encounter: Payer: Self-pay | Admitting: Family Medicine

## 2021-01-29 ENCOUNTER — Other Ambulatory Visit: Payer: Self-pay

## 2021-01-29 ENCOUNTER — Ambulatory Visit (INDEPENDENT_AMBULATORY_CARE_PROVIDER_SITE_OTHER): Payer: Medicare Other | Admitting: Family Medicine

## 2021-01-29 VITALS — BP 148/79 | HR 56 | Temp 97.0°F | Ht 69.0 in | Wt 151.0 lb

## 2021-01-29 DIAGNOSIS — I10 Essential (primary) hypertension: Secondary | ICD-10-CM

## 2021-01-29 DIAGNOSIS — E785 Hyperlipidemia, unspecified: Secondary | ICD-10-CM

## 2021-01-29 DIAGNOSIS — E119 Type 2 diabetes mellitus without complications: Secondary | ICD-10-CM | POA: Diagnosis not present

## 2021-01-29 DIAGNOSIS — I2510 Atherosclerotic heart disease of native coronary artery without angina pectoris: Secondary | ICD-10-CM

## 2021-01-29 DIAGNOSIS — F17200 Nicotine dependence, unspecified, uncomplicated: Secondary | ICD-10-CM

## 2021-01-29 LAB — POCT GLYCOSYLATED HEMOGLOBIN (HGB A1C)
HbA1c POC (<> result, manual entry): 6.1 % (ref 4.0–5.6)
HbA1c, POC (controlled diabetic range): 6.1 % (ref 0.0–7.0)
HbA1c, POC (prediabetic range): 6.1 % (ref 5.7–6.4)
Hemoglobin A1C: 6.1 % — AB (ref 4.0–5.6)

## 2021-01-29 LAB — GLUCOSE, POCT (MANUAL RESULT ENTRY): POC Glucose: 93 mg/dl (ref 70–99)

## 2021-01-29 NOTE — Progress Notes (Signed)
[facility]    Established Patient Office Visit  Subjective:  Patient ID: TOSHIBA NULL, female    DOB: Jun 05, 1956  Age: 65 y.o. MRN: [id number]  CC:  Chief Complaint  Patient presents with  . Follow-up    3 month follow up; no questions or concerns    HPI  Amber Suarez is a 65 year old female with a medical history significant for type 2 DM, CHF, hyperlipidemia, and hypertension that presents for follow-up of chronic conditions.  Patient says that she has been doing well and is without complaint.  Diabetes She presents for her follow-up diabetic visit. She has type 2 diabetes mellitus. Her disease course has been stable. Pertinent negatives for hypoglycemia include no confusion, dizziness, headaches, mood changes, nervousness/anxiousness, pallor, seizures, sleepiness, speech difficulty, sweats or tremors. Pertinent negatives for diabetes include no blurred vision, no chest pain, no fatigue, no foot paresthesias, no polydipsia, no polyphagia, no polyuria, no weakness and no weight loss. Symptoms are stable. Risk factors for coronary artery disease include hypertension and sedentary lifestyle. Current diabetic treatment includes oral agent (monotherapy). She is compliant with treatment all of the time. An ACE inhibitor/angiotensin II receptor blocker is being taken. She does not see a podiatrist.Eye exam is current.  Hypertension This is a chronic problem. Pertinent negatives include no blurred vision, chest pain, headaches, malaise/fatigue, neck pain, orthopnea, PND, shortness of breath or sweats. There are no compliance problems.     Past Medical History:  Diagnosis Date  . CHF (congestive heart failure) (HCC)   . Diabetes mellitus   . Heart murmur   . Hyperlipidemia   . Hypertension   . Myocardial infarction (HCC)   . Stroke [facility])     Past Surgical History:  Procedure Laterality Date  . ABDOMINAL HYSTERECTOMY    .  angoiplasty    . LEFT HEART CATHETERIZATION WITH CORONARY ANGIOGRAM N/A 01/28/2012   Procedure: LEFT HEART CATHETERIZATION WITH CORONARY ANGIOGRAM;  Surgeon: Marykay Lex, MD;  Location: [facility] CATH LAB;  Service: Cardiovascular;  Laterality: N/A;  . repair of aneursym  1997    Family History  Problem Relation Age of Onset  . Diabetes Mother   . Hypertension Mother   . Hypertension Father   . Hypertension Sister     Social History   Socioeconomic History  . Marital status: Divorced    Spouse name: Not on file  . Number of children: Not on file  . Years of education: Not on file  . Highest education level: Not on file  Occupational History  . Not on file  Tobacco Use  . Smoking status: Current Every Day Smoker    Packs/day: 0.50    Years: 40.00    Pack years: 20.00    Types: Cigarettes  . Smokeless tobacco: Never Used  Vaping Use  . Vaping Use: Never used  Substance and Sexual Activity  . Alcohol use: No  . Drug use: No  . Sexual activity: Not Currently    Birth control/protection: Surgical  Other Topics Concern  . Not on file  Social History Narrative  . Not on file   Social Determinants of Health   Financial Resource Strain: Not on file  Food Insecurity: Not on file  Transportation Needs: Not on file  Physical Activity: Not on file  Stress: Not on file  Social Connections: Not on file  Intimate Partner Violence: Not on file    Outpatient Medications Prior to Visit  Medication Sig Dispense Refill  . amLODipine (NORVASC) 10 MG tablet TAKE 1 TABLET (10 MG TOTAL) BY MOUTH DAILY. 90 tablet 1  . aspirin EC 81 MG tablet Take 1 tablet (81 mg total) by mouth daily. Swallow whole. 90 tablet 3  . Blood Pressure Monitoring DEVI 1 each by Does not apply route daily. 1 Device 0  . glimepiride (AMARYL) 4 MG tablet Take 0.5 tablets (2 mg total) by mouth daily with breakfast. 30 tablet 3  . isosorbide mononitrate (IMDUR) 30 MG 24 hr tablet TAKE 1 TABLET BY MOUTH DAILY 30  tablet 1  . lisinopril (ZESTRIL) 10 MG tablet TAKE 1 TABLET BY MOUTH DAILY 30 tablet 3  . meloxicam (MOBIC) 7.5 MG tablet Take 7.5 mg by mouth as needed for pain.    . nitroGLYCERIN (NITROSTAT) 0.4 MG SL tablet Place 0.4 mg under the tongue every 5 (five) minutes as needed. Call 911 if no improvement after 3rd tab    . potassium chloride SA (K-DUR,KLOR-CON) 20 MEQ tablet Take 1 tablet (20 mEq total) by mouth 2 (two) times daily. 60 tablet 3  . pravastatin (PRAVACHOL) 40 MG tablet TAKE 1 TABLET BY MOUTH DAILY 30 tablet 4  . spironolactone (ALDACTONE) 100 MG tablet TAKE 1 TABLET BY MOUTH TWICE A DAY 180 tablet 2   No facility-administered medications prior to visit.    Allergies  Allergen Reactions  . Morphine And Related Itching and Other (See Comments)    burning    ROS Review of Systems  Constitutional: Negative for fatigue, malaise/fatigue and weight loss.  Eyes: Negative for blurred vision.  Respiratory: Negative for shortness of breath.   Cardiovascular: Negative for chest pain, orthopnea and PND.  Endocrine: Negative for polydipsia, polyphagia and polyuria.  Musculoskeletal: Negative for neck pain.  Skin: Negative for pallor.  Neurological: Negative for dizziness, tremors, seizures, speech difficulty, weakness and headaches.  Psychiatric/Behavioral: Negative for confusion. The patient is not nervous/anxious.       Objective:    Physical Exam Constitutional:      Appearance: Normal appearance.  Eyes:     Pupils: Pupils are equal, round, and reactive to light.  Cardiovascular:     Rate and Rhythm: Normal rate and regular rhythm.     Pulses: Normal pulses.  Pulmonary:     Effort: Pulmonary effort is normal.  Abdominal:     General: Abdomen is flat. Bowel sounds are normal.  Musculoskeletal:        General: Normal range of motion.  Skin:    General: Skin is warm.  Neurological:     General: No focal deficit present.     Mental Status: She is alert. Mental status is  at baseline.  Psychiatric:        Mood and Affect: Mood normal.        Behavior: Behavior normal.        Thought Content: Thought content normal.        Judgment: Judgment normal.     BP (!) 148/79 (BP Location: Left Arm, Patient Position: Sitting, Cuff Size: Small)   Pulse (!) 56   Temp (!) 97 F (36.1 C)   Ht 5\' 9"  (1.753 m)   Wt 151 lb 0.2 oz (68.5 kg)   BMI 22.30 kg/m  Wt Readings from Last 3 Encounters:  01/29/21 151 lb 0.2 oz (68.5 kg)  10/30/20 150 lb (68 kg)  10/30/20 150 lb (68 kg)     There are no preventive care reminders to display  for this patient.  There are no preventive care reminders to display for this patient.  Lab Results  Component Value Date   TSH 1.15 01/16/2017   Lab Results  Component Value Date   WBC 5.5 05/17/2020   HGB 13.9 05/17/2020   HCT 41.3 05/17/2020   MCV 87 05/17/2020   PLT 158 05/17/2020   Lab Results  Component Value Date   NA 139 10/30/2020   K 4.5 10/30/2020   CO2 22 10/30/2020   GLUCOSE 133 (H) 10/30/2020   BUN 11 10/30/2020   CREATININE 1.22 (H) 10/30/2020   BILITOT 0.3 10/30/2020   ALKPHOS 87 10/30/2020   AST 9 10/30/2020   ALT 6 10/30/2020   PROT 7.5 10/30/2020   ALBUMIN 4.5 10/30/2020   CALCIUM 9.6 10/30/2020   ANIONGAP 10 05/04/2020   GFR 68.87 06/15/2017   Lab Results  Component Value Date   CHOL 130 10/30/2020   Lab Results  Component Value Date   HDL 31 (L) 10/30/2020   Lab Results  Component Value Date   LDLCALC 72 10/30/2020   Lab Results  Component Value Date   TRIG 152 (H) 10/30/2020   Lab Results  Component Value Date   CHOLHDL 4.2 10/30/2020   Lab Results  Component Value Date   HGBA1C 6.1 (A) 01/29/2021   HGBA1C 6.1 01/29/2021   HGBA1C 6.1 01/29/2021   HGBA1C 6.1 01/29/2021      Assessment & Plan:   Problem List Items Addressed This Visit      Cardiovascular and Mediastinum   HYPERTENSION, BENIGN ESSENTIAL   Relevant Orders   Basic Metabolic Panel   Urinalysis,  Routine w reflex microscopic     Endocrine   Diabetes mellitus type 2, controlled (HCC) - Primary   Relevant Orders   HgB A1c (Completed)   Glucose (CBG) (Completed)   Basic Metabolic Panel   Urinalysis, Routine w reflex microscopic     Other   TOBACCO DEPENDENCE   HLD (hyperlipidemia)     1. Controlled type 2 diabetes mellitus without complication, without long-term current use of insulin (HCC) Hemoglobin a1C has improved to 6.1 from 7.1 six months prior.  No medication changes warranted on today - HgB A1c - Glucose (CBG) - Basic Metabolic Panel - Urinalysis, Routine w reflex microscopic  2. Essential hypertension, benign BP (!) 148/79 (BP Location: Left Arm, Patient Position: Sitting, Cuff Size: Small)   Pulse (!) 56   Temp (!) 97 F (36.1 C)   Ht 5\' 9"  (1.753 m)   Wt 151 lb 0.2 oz (68.5 kg)   BMI 22.30 kg/m   - Basic Metabolic Panel - Urinalysis, Routine w reflex microscopic  3. Hyperlipidemia, unspecified hyperlipidemia type The 10-year ASCVD risk score DC Jr., et al., 2013) is: 39.5%   Values used to calculate the score:     Age: 33 years     Sex: Female     Is Non-Hispanic African American: Yes     Diabetic: Yes     Tobacco smoker: Yes     Systolic Blood Pressure: 148 mmHg     Is BP treated: Yes     HDL Cholesterol: 31 mg/dL     Total Cholesterol: 130 mg/dL   4. TOBACCO DEPENDENCE Smoking cessation instruction/counseling given:  counseled patient on the dangers of tobacco use, advised patient to stop smoking, and reviewed strategies to maximize success    Follow-up: Return in about 3 months (around 05/01/2021) for diabetes, hypertension.    05/03/2021  Rennis PettyMoore Constance Hackenberg  APRN, MSN, FNP-C [facility] [address]  [place], [place] [id number] [phone]

## 2021-01-29 NOTE — Patient Instructions (Signed)
Diabetes Mellitus Action Plan Following a diabetes action plan is a way for you to manage your diabetes (diabetes mellitus) symptoms. The plan is color-coded to help you understand what actions you need to take based on any symptoms you are having.  If you have symptoms in the red zone, you need medical care right away.  If you have symptoms in the yellow zone, you are having problems.  If you have symptoms in the green zone, you are doing well. Learning about and understanding diabetes can take time. Follow the plan that you develop with your health care provider. Know the target range for your blood sugar (glucose) level, and review your treatment plan with your health care provider at each visit. The target range for my blood sugar level is __________________________ mg/dL. Red zone Get medical help right away if you have any of the following symptoms:  A blood sugar test result that is below 54 mg/dL (3 mmol/L).  A blood sugar test result that is at or above 240 mg/dL (13.3 mmol/L) for 2 days in a row.  Confusion or trouble thinking clearly.  Difficulty breathing.  Sickness or a fever for 2 or more days that is not getting better.  Moderate or large ketone levels in your urine.  Feeling tired or having no energy. If you have any red zone symptoms, do not wait to see if the symptoms will go away. Get medical help right away. Call your local emergency services (911 in the [place] Do not drive yourself to the hospital. If you have severely low blood sugar (severe hypoglycemia) and you cannot eat or drink, you may need glucagon. Make sure a family member or close friend knows how to check your blood sugar and how to give you glucagon. You may need to be treated in a hospital for this condition.   Yellow zone If you have any of the following symptoms, your diabetes is not under control and you may need to make some changes:  A blood sugar test result that is at or above 240 mg/dL (13.3  mmol/L) for 2 days in a row.  Blood sugar test results that are below 70 mg/dL (3.9 mmol/L).  Other symptoms of hypoglycemia, such as: ? Shaking or feeling light-headed. ? Confusion or irritability. ? Feeling hungry. ? Having a fast heartbeat. If you have any yellow zone symptoms:  Treat your hypoglycemia by eating or drinking 15 grams of a rapid-acting carbohydrate. Follow the 15:15 rule: ? Take 15 grams of a rapid-acting carbohydrate, such as:  1 tube of glucose gel.  4 glucose pills.  4 oz (120 mL) of fruit juice.  4 oz (120 mL) of regular (not diet) soda. ? Check your blood sugar 15 minutes after you take the carbohydrate. ? If the repeat blood sugar test is still at or below 70 mg/dL (3.9 mmol/L), take 15 grams of a carbohydrate again. ? If your blood sugar does not increase above 70 mg/dL (3.9 mmol/L) after 3 tries, get medical help right away. ? After your blood sugar returns to normal, eat a meal or a snack within 1 hour.  Keep taking your daily medicines as told by your health care provider.  Check your blood sugar more often than you normally would. ? Write down your results. ? Call your health care provider if you have trouble keeping your blood sugar in your target range.   Green zone These signs mean you are doing well and you can continue what you   are doing to manage your diabetes:  Your blood sugar is within your personal target range. For most people, a blood sugar level before a meal (preprandial) should be 80-130 mg/dL (3.0-1.6 mmol/L).  You feel well, and you are able to do daily activities. If you are in the green zone, continue to manage your diabetes as told by your health care provider. To do this:  Eat a healthy diet.  Exercise regularly.  Check your blood sugar as told by your health care provider.  Take your medicines as told by your health care provider.   Where to find more information  American Diabetes Association (ADA):  diabetes.org  Association of Diabetes Care & Education Specialists (ADCES): diabeteseducator.org Summary  Following a diabetes action plan is a way for you to manage your diabetes symptoms. The plan is color-coded to help you understand what actions you need to take based on any symptoms you are having.  Follow the plan that you develop with your health care provider. Make sure you know your personal target blood sugar level.  Review your treatment plan with your health care provider at each visit. This information is not intended to replace advice given to you by your health care provider. Make sure you discuss any questions you have with your health care provider. Document Revised: 03/08/2020 Document Reviewed: 03/08/2020 Elsevier Patient Education  2021 Elsevier Inc.    Tobacco Use Disorder Tobacco use disorder (TUD) occurs when a person craves, seeks, and uses tobacco, regardless of the consequences. This disorder can cause problems with mental and physical health. It can affect your ability to have healthy relationships, and it can keep you from meeting your responsibilities at work, home, or school. Tobacco may be:  Smoked as a cigarette or cigar.  Inhaled using e-cigarettes.  Smoked in a pipe or hookah.  Chewed as smokeless tobacco.  Inhaled into the nostrils as snuff. Tobacco products contain a dangerous chemical called nicotine, which is very addictive. Nicotine triggers hormones that make the body feel stimulated and works on areas of the brain that make you feel good. These effects can make it hard for people to quit nicotine. Tobacco contains many other unsafe chemicals that can damage almost every organ in the body. Smoking tobacco also puts others in danger due to fire risk and possible health problems caused by breathing in secondhand smoke. What are the signs or symptoms? Symptoms of TUD may include:  Being unable to slow down or stop your tobacco use.  Spending  an abnormal amount of time getting or using tobacco.  Craving tobacco. Cravings may last for up to 6 months after quitting.  Tobacco use that: ? Interferes with your work, school, or home life. ? Interferes with your personal and social relationships. ? Makes you give up activities that you once enjoyed or found important.  Using tobacco even though you know that it is: ? Dangerous or bad for your health or someone else's health. ? Causing problems in your life.  Needing more and more of the substance to get the same effect (developing tolerance).  Experiencing unpleasant symptoms if you do not use the substance (withdrawal). Withdrawal symptoms may include: ? Depressed, anxious, or irritable mood. ? Difficulty concentrating. ? Increased appetite. ? Restlessness or trouble sleeping.  Using the substance to avoid withdrawal. How is this diagnosed? This condition may be diagnosed based on:  Your current and past tobacco use. Your health care provider may ask questions about how your tobacco use affects  your life.  A physical exam. You may be diagnosed with TUD if you have at least two symptoms within a 73-month period. How is this treated? This condition is treated by stopping tobacco use. Many people are unable to quit on their own and need help. Treatment may include:  Nicotine replacement therapy (NRT). NRT provides nicotine without the other harmful chemicals in tobacco. NRT gradually lowers the dosage of nicotine in the body and reduces withdrawal symptoms. NRT is available as: ? Over-the-counter gums, lozenges, and skin patches. ? Prescription mouth inhalers and nasal sprays.  Medicine that acts on the brain to reduce cravings and withdrawal symptoms.  A type of talk therapy that examines your triggers for tobacco use, how to avoid them, and how to cope with cravings (behavioral therapy).  Hypnosis. This may help with withdrawal symptoms.  Joining a support group for  others coping with TUD. The best treatment for TUD is usually a combination of medicine, talk therapy, and support groups. Recovery can be a long process. Many people start using tobacco again after stopping (relapse). If you relapse, it does not mean that treatment will not work. Follow these instructions at home: Lifestyle  Do not use any products that contain nicotine or tobacco, such as cigarettes and e-cigarettes.  Avoid things that trigger tobacco use as much as you can. Triggers include people and situations that usually cause you to use tobacco.  Avoid drinks that contain caffeine, including coffee. These may worsen some withdrawal symptoms.  Find ways to manage stress. Wanting to smoke may cause stress, and stress can make you want to smoke. Relaxation techniques such as deep breathing, meditation, and yoga may help.  Attend support groups as needed. These groups are an important part of long-term recovery for many people. General instructions  Take over-the-counter and prescription medicines only as told by your health care provider.  Check with your health care provider before taking any new prescription or over-the-counter medicines.  Decide on a friend, family member, or smoking quit-line (such as 1-800-QUIT-NOW in the [place] that you can call or text when you feel the urge to smoke or when you need help coping with cravings.  Keep all follow-up visits as told by your health care provider and therapist. This is important.   Contact a health care provider if:  You are not able to take your medicines as prescribed.  Your symptoms get worse, even with treatment. Summary  Tobacco use disorder (TUD) occurs when a person craves, seeks, and uses tobacco regardless of the consequences.  This condition may be diagnosed based on your current and past tobacco use and a physical exam.  Many people are unable to quit on their own and need help. Recovery can be a long process.  The  most effective treatment for TUD is usually a combination of medicine, talk therapy, and support groups. This information is not intended to replace advice given to you by your health care provider. Make sure you discuss any questions you have with your health care provider. Document Revised: 08/19/2017 Document Reviewed: 08/19/2017 Elsevier Patient Education  2021 [employer].

## 2021-01-30 LAB — URINALYSIS, ROUTINE W REFLEX MICROSCOPIC
Bilirubin, UA: NEGATIVE
Glucose, UA: NEGATIVE
Ketones, UA: NEGATIVE
Nitrite, UA: NEGATIVE
Protein,UA: NEGATIVE
RBC, UA: NEGATIVE
Specific Gravity, UA: 1.008 (ref 1.005–1.030)
Urobilinogen, Ur: 1 mg/dL (ref 0.2–1.0)
pH, UA: 5.5 (ref 5.0–7.5)

## 2021-01-30 LAB — MICROSCOPIC EXAMINATION
Casts: NONE SEEN /lpf
RBC, Urine: NONE SEEN /hpf (ref 0–2)

## 2021-01-30 LAB — BASIC METABOLIC PANEL
BUN/Creatinine Ratio: 7 — ABNORMAL LOW (ref 12–28)
BUN: 8 mg/dL (ref 8–27)
CO2: 24 mmol/L (ref 20–29)
Calcium: 9.3 mg/dL (ref 8.7–10.3)
Chloride: 104 mmol/L (ref 96–106)
Creatinine, Ser: 1.23 mg/dL — ABNORMAL HIGH (ref 0.57–1.00)
Glucose: 91 mg/dL (ref 65–99)
Potassium: 4.1 mmol/L (ref 3.5–5.2)
Sodium: 143 mmol/L (ref 134–144)
eGFR: 48 mL/min/{1.73_m2} — ABNORMAL LOW (ref >59)

## 2021-02-18 ENCOUNTER — Other Ambulatory Visit: Payer: Self-pay | Admitting: Family Medicine

## 2021-02-18 DIAGNOSIS — I10 Essential (primary) hypertension: Secondary | ICD-10-CM

## 2021-03-20 ENCOUNTER — Other Ambulatory Visit: Payer: Self-pay | Admitting: Family Medicine

## 2021-03-20 DIAGNOSIS — I10 Essential (primary) hypertension: Secondary | ICD-10-CM

## 2021-05-28 ENCOUNTER — Telehealth: Payer: Self-pay

## 2021-05-28 NOTE — Telephone Encounter (Signed)
LVM to have pt call back to schedule AWV.   RE: confirm insurance and schedule AWV on my schedule if times are convenient for patient or other AWV schedule as template permits.   

## 2021-06-25 ENCOUNTER — Other Ambulatory Visit: Payer: Self-pay | Admitting: Family Medicine

## 2021-06-25 DIAGNOSIS — I10 Essential (primary) hypertension: Secondary | ICD-10-CM

## 2021-08-26 ENCOUNTER — Other Ambulatory Visit: Payer: Self-pay | Admitting: Family Medicine

## 2021-08-26 DIAGNOSIS — I10 Essential (primary) hypertension: Secondary | ICD-10-CM

## 2021-09-23 ENCOUNTER — Other Ambulatory Visit: Payer: Self-pay | Admitting: Family Medicine

## 2021-09-23 DIAGNOSIS — I10 Essential (primary) hypertension: Secondary | ICD-10-CM

## 2021-09-23 DIAGNOSIS — E119 Type 2 diabetes mellitus without complications: Secondary | ICD-10-CM

## 2021-09-24 ENCOUNTER — Telehealth: Payer: Self-pay

## 2021-09-24 NOTE — Telephone Encounter (Signed)
Glimepride Isosrob pravastatin

## 2021-10-02 NOTE — Telephone Encounter (Signed)
Medication refills have already been sent to the patient's pharmacy.

## 2021-10-15 ENCOUNTER — Ambulatory Visit: Payer: Medicare Other | Admitting: Family Medicine

## 2021-10-29 ENCOUNTER — Encounter: Payer: Self-pay | Admitting: Family Medicine

## 2021-10-29 ENCOUNTER — Other Ambulatory Visit: Payer: Self-pay

## 2021-10-29 ENCOUNTER — Ambulatory Visit (INDEPENDENT_AMBULATORY_CARE_PROVIDER_SITE_OTHER): Payer: Medicare PPO | Admitting: Family Medicine

## 2021-10-29 VITALS — BP 126/78 | HR 64 | Temp 97.4°F | Ht 69.0 in | Wt 154.5 lb

## 2021-10-29 DIAGNOSIS — E119 Type 2 diabetes mellitus without complications: Secondary | ICD-10-CM | POA: Diagnosis not present

## 2021-10-29 DIAGNOSIS — F17200 Nicotine dependence, unspecified, uncomplicated: Secondary | ICD-10-CM | POA: Diagnosis not present

## 2021-10-29 DIAGNOSIS — I152 Hypertension secondary to endocrine disorders: Secondary | ICD-10-CM

## 2021-10-29 DIAGNOSIS — Z1211 Encounter for screening for malignant neoplasm of colon: Secondary | ICD-10-CM | POA: Diagnosis not present

## 2021-10-29 DIAGNOSIS — Z23 Encounter for immunization: Secondary | ICD-10-CM

## 2021-10-29 DIAGNOSIS — I10 Essential (primary) hypertension: Secondary | ICD-10-CM

## 2021-10-29 DIAGNOSIS — E785 Hyperlipidemia, unspecified: Secondary | ICD-10-CM

## 2021-10-29 LAB — POCT URINALYSIS DIP (CLINITEK)
Blood, UA: NEGATIVE
Glucose, UA: NEGATIVE mg/dL
Nitrite, UA: NEGATIVE
POC PROTEIN,UA: 30 — AB
Spec Grav, UA: >=1.03 — AB (ref 1.010–1.025)
Urobilinogen, UA: 2 E.U./dL — AB
pH, UA: 6 (ref 5.0–8.0)

## 2021-10-29 LAB — POCT GLYCOSYLATED HEMOGLOBIN (HGB A1C)
HbA1c POC (<> result, manual entry): 6.8 % (ref 4.0–5.6)
HbA1c, POC (controlled diabetic range): 6.8 % (ref 0.0–7.0)
HbA1c, POC (prediabetic range): 6.8 % — AB (ref 5.7–6.4)
Hemoglobin A1C: 6.8 % — AB (ref 4.0–5.6)

## 2021-10-29 NOTE — Progress Notes (Signed)
Patient [facility]  Established Patient Office Visit  Subjective:  Patient ID: Amber Suarez, female    DOB: [birth year]  Age: 66 y.o. MRN: [id number]  CC:  Chief Complaint  Patient presents with   Follow-up    Pt is here for follow up visit. No issues or concerns     HPI Amber Suarez is a very pleasant 66 year old female with a medical history significant for type 2 diabetes mellitus, hypertension, hyperlipidemia, and tobacco dependence that presents for follow-up of chronic conditions.  Patient states that she has been doing well and is without complaint.  Patient states that she has been taking all prescribed medications consistently over the past 6 months.  She does not check blood glucose at home.  She says that her appetite has been a little decreased lately.  Her family is concerned that she does not eat enough.  She typically eats 1-2 meals per day.  Patient does not exercise.   Patient also has a medical history significant for hypertension. Patient's daughter works as a Physiological scientist and occasionally checks her blood pressure.  Patient has 1 daughter Gwenlyn Found, who is her support system, along with her granddaughter.  Patient was previously followed by cardiology.  She has been lost to follow-up.  She does not have any dizziness, chest pain, heart palpitations, polyuria, polydipsia, or polyphagia.  Patient is a chronic everyday tobacco user.  She generally eats half pack per day, which is roughly around 10 cigarettes.  She has attempted to quit in the past without success.  However, she has decreased her smoking from 1 pack to half a pack per day.  Congratulated for her efforts.  Past Medical History:  Diagnosis Date   CHF (congestive heart failure) (HCC)    Diabetes mellitus    Heart murmur    Hyperlipidemia    Hypertension    Myocardial infarction [facility])    Stroke ([place])    Immunization History  Administered Date(s)  Administered   Influenza Split 06/26/2011, 07/14/2012   Influenza Whole 06/15/2008   PFIZER(Purple Top)SARS-COV-2 Vaccination 11/01/2019, 11/23/2019, 11/22/2020   Pneumococcal Conjugate-13 10/29/2021   Pneumococcal Polysaccharide-23 10/27/2013   Td 01/18/2004   Tdap 01/16/2017     Past Surgical History:  Procedure Laterality Date   ABDOMINAL HYSTERECTOMY     angoiplasty     LEFT HEART CATHETERIZATION WITH CORONARY ANGIOGRAM N/A 01/28/2012   Procedure: LEFT HEART CATHETERIZATION WITH CORONARY ANGIOGRAM;  Surgeon: Leonie Man, MD;  Location: [facility] CATH LAB;  Service: Cardiovascular;  Laterality: N/A;   repair of aneursym  1997    Family History  Problem Relation Age of Onset   Diabetes Mother    Hypertension Mother    Hypertension Father    Hypertension Sister     Social History   Socioeconomic History   Marital status: Divorced    Spouse name: Not on file   Number of children: Not on file   Years of education: Not on file   Highest education level: Not on file  Occupational History   Not on file  Tobacco Use   Smoking status: Every Day    Packs/day: 0.50    Years: 40.00    Pack years: 20.00    Types: Cigarettes   Smokeless tobacco: Never  Vaping Use   Vaping Use: Never used  Substance and Sexual Activity   Alcohol use: No   Drug use: No   Sexual activity: Not Currently  Birth control/protection: Surgical  Other Topics Concern   Not on file  Social History Narrative   Not on file   Social Determinants of Health   Financial Resource Strain: Not on file  Food Insecurity: Not on file  Transportation Needs: Not on file  Physical Activity: Not on file  Stress: Not on file  Social Connections: Not on file  Intimate Partner Violence: Not on file    Outpatient Medications Prior to Visit  Medication Sig Dispense Refill   amLODipine (NORVASC) 10 MG tablet TAKE 1 TABLET BY MOUTH DAILY 30 tablet 3   aspirin EC 81 MG tablet Take 1 tablet (81 mg total) by  mouth daily. Swallow whole. 90 tablet 3   Blood Pressure Monitoring DEVI 1 each by Does not apply route daily. 1 Device 0   glimepiride (AMARYL) 4 MG tablet TAKE 0.5 TABLETS (2 MG TOTAL) BY MOUTH DAILY WITH BREAKFAST. 30 tablet 1   isosorbide mononitrate (IMDUR) 30 MG 24 hr tablet TAKE 1 TABLET BY MOUTH DAILY 30 tablet 1   lisinopril (ZESTRIL) 10 MG tablet TAKE 1 TABLET BY MOUTH DAILY 30 tablet 3   meloxicam (MOBIC) 7.5 MG tablet Take 7.5 mg by mouth as needed for pain.     nitroGLYCERIN (NITROSTAT) 0.4 MG SL tablet Place 0.4 mg under the tongue every 5 (five) minutes as needed. Call 911 if no improvement after 3rd tab     potassium chloride SA (K-DUR,KLOR-CON) 20 MEQ tablet Take 1 tablet (20 mEq total) by mouth 2 (two) times daily. 60 tablet 3   pravastatin (PRAVACHOL) 40 MG tablet TAKE 1 TABLET BY MOUTH DAILY 30 tablet 1   spironolactone (ALDACTONE) 100 MG tablet TAKE 1 TABLET BY MOUTH TWICE A DAY 180 tablet 2   No facility-administered medications prior to visit.    Allergies  Allergen Reactions   Morphine And Related Itching and Other (See Comments)    burning    ROS Review of Systems  Constitutional: Negative.   HENT: Negative.    Eyes: Negative.   Respiratory: Negative.    Cardiovascular: Negative.   Gastrointestinal: Negative.   Endocrine: Negative for polydipsia, polyphagia and polyuria.  Genitourinary: Negative.   Musculoskeletal: Negative.   Skin: Negative.   Neurological: Negative.   Hematological: Negative.   Psychiatric/Behavioral: Negative.       Objective:    Physical Exam Constitutional:      Appearance: Normal appearance.  Eyes:     Pupils: Pupils are equal, round, and reactive to light.  Cardiovascular:     Rate and Rhythm: Normal rate and regular rhythm.     Pulses: Normal pulses.  Pulmonary:     Effort: Pulmonary effort is normal.  Abdominal:     General: Bowel sounds are normal.  Skin:    General: Skin is warm.  Neurological:     General: No  focal deficit present.     Mental Status: She is alert. Mental status is at baseline.  Psychiatric:        Mood and Affect: Mood normal.        Behavior: Behavior normal.        Thought Content: Thought content normal.        Judgment: Judgment normal.    BP 126/78    Pulse 64    Temp (!) 97.4 F (36.3 C)    Ht '5\' 9"'  (1.753 m)    Wt 154 lb 8 oz (70.1 kg)    SpO2 98%    BMI  22.82 kg/m  Wt Readings from Last 3 Encounters:  10/29/21 154 lb 8 oz (70.1 kg)  01/29/21 151 lb 0.2 oz (68.5 kg)  10/30/20 150 lb (68 kg)     Health Maintenance Due  Topic Date Due   COLONOSCOPY (Pts 45-95yr Insurance coverage will need to be confirmed)  Never done   Zoster Vaccines- Shingrix (1 of 2) Never done   OPHTHALMOLOGY EXAM  02/13/2021    There are no preventive care reminders to display for this patient.  Lab Results  Component Value Date   TSH 1.15 01/16/2017   Lab Results  Component Value Date   WBC 5.5 05/17/2020   HGB 13.9 05/17/2020   HCT 41.3 05/17/2020   MCV 87 05/17/2020   PLT 158 05/17/2020   Lab Results  Component Value Date   NA 142 10/29/2021   K 4.5 10/29/2021   CO2 25 10/29/2021   GLUCOSE 87 10/29/2021   BUN 14 10/29/2021   CREATININE 1.26 (H) 10/29/2021   BILITOT 0.3 10/29/2021   ALKPHOS 81 10/29/2021   AST 12 10/29/2021   ALT 5 10/29/2021   PROT 7.3 10/29/2021   ALBUMIN 4.6 10/29/2021   CALCIUM 9.3 10/29/2021   ANIONGAP 10 05/04/2020   EGFR 47 (L) 10/29/2021   GFR 68.87 06/15/2017   Lab Results  Component Value Date   CHOL 128 10/29/2021   Lab Results  Component Value Date   HDL 30 (L) 10/29/2021   Lab Results  Component Value Date   LDLCALC 74 10/29/2021   Lab Results  Component Value Date   TRIG 131 10/29/2021   Lab Results  Component Value Date   CHOLHDL 4.3 10/29/2021   Lab Results  Component Value Date   HGBA1C 6.8 (A) 10/29/2021   HGBA1C 6.8 10/29/2021   HGBA1C 6.8 (A) 10/29/2021   HGBA1C 6.8 10/29/2021      Assessment & Plan:    Problem List Items Addressed This Visit       Cardiovascular and Mediastinum   HYPERTENSION, BENIGN ESSENTIAL   Hypertension due to endocrine disorder     Endocrine   Diabetes mellitus type 2, controlled ([place]   Relevant Orders   CMP and Liver (Completed)   POCT glycosylated hemoglobin (Hb A1C) (Completed)   POCT URINALYSIS DIP (CLINITEK) (Completed)     Other   HLD (hyperlipidemia)   Relevant Orders   Lipid Panel   Other Visit Diagnoses     Need for pneumococcal vaccination       Relevant Orders   Pneumococcal conjugate vaccine 13-valent (Completed)   Colon cancer screening       Relevant Orders   Ambulatory referral to Gastroenterology   Tobacco dependence          1. Controlled type 2 diabetes mellitus without complication, without long-term current use of insulin (HCC) Patient's hemoglobin A1c is 6.8, which is at goal.  Patient encouraged to continue carbohydrate modified diet divided over small meals throughout the day.  Also, increase physical activity to a brisk walk around 2-3 times per week.  Patient expressed understanding.  Also, recommend that Glucerna drinks in between meals, also if patient does not feel like eating she can replace meals with Glucerna - CMP and Liver - POCT glycosylated hemoglobin (Hb A1C) - POCT URINALYSIS DIP (CLINITEK)  2. Need for pneumococcal vaccination Patient received pneumococcal vaccination without complication - Pneumococcal conjugate vaccine 13-valent  3. Colon cancer screening Patient states that its been greater than 10 years since  her last colonoscopy.  We will send referral to gastroenterology for routine colonoscopy. - Ambulatory referral to Gastroenterology  4. Tobacco dependence Patient counseled on the dangers of smoking especially in the setting of hypertension, diabetes, and hyperlipidemia.  Patient is not ready for interventions to promote smoking cessation.  She states that she will continue to try to  cut back on cigarettes  5. Essential hypertension, benign - Continue medication, monitor blood pressure at home. Continue DASH diet.  Reminder to go to the ER if any CP, SOB, nausea, dizziness, severe HA, changes vision/speech, left arm numbness and tingling and jaw pain.    6. Hyperlipidemia, unspecified hyperlipidemia type The patient is asked to make an attempt to improve diet and exercise patterns to aid in medical management of this problem.  - Lipid Panel  7. Hypertension due to endocrine disorder   Follow-up: Return in about 6 months (around 04/28/2022) for hypertension, hyperlipidemia, diabetes.    Donia Pounds  APRN, MSN, FNP-C Patient [place] [address] [place], [place] [address] [phone]

## 2021-10-29 NOTE — Patient Instructions (Signed)

## 2021-10-30 LAB — CMP AND LIVER
ALT: 5 IU/L (ref 0–32)
AST: 12 IU/L (ref 0–40)
Albumin: 4.6 g/dL (ref 3.8–4.8)
Alkaline Phosphatase: 81 IU/L (ref 44–121)
BUN: 14 mg/dL (ref 8–27)
Bilirubin Total: 0.3 mg/dL (ref 0.0–1.2)
Bilirubin, Direct: <0.1 mg/dL (ref 0.00–0.40)
CO2: 25 mmol/L (ref 20–29)
Calcium: 9.3 mg/dL (ref 8.7–10.3)
Chloride: 105 mmol/L (ref 96–106)
Creatinine, Ser: 1.26 mg/dL — ABNORMAL HIGH (ref 0.57–1.00)
Glucose: 87 mg/dL (ref 70–99)
Potassium: 4.5 mmol/L (ref 3.5–5.2)
Sodium: 142 mmol/L (ref 134–144)
Total Protein: 7.3 g/dL (ref 6.0–8.5)
eGFR: 47 mL/min/{1.73_m2} — ABNORMAL LOW (ref >59)

## 2021-10-30 LAB — LIPID PANEL
Chol/HDL Ratio: 4.3 ratio (ref 0.0–4.4)
Cholesterol, Total: 128 mg/dL (ref 100–199)
HDL: 30 mg/dL — ABNORMAL LOW (ref >39)
LDL Chol Calc (NIH): 74 mg/dL (ref 0–99)
Triglycerides: 131 mg/dL (ref 0–149)
VLDL Cholesterol Cal: 24 mg/dL (ref 5–40)

## 2021-12-02 ENCOUNTER — Other Ambulatory Visit: Payer: Self-pay | Admitting: Family Medicine

## 2021-12-02 DIAGNOSIS — E119 Type 2 diabetes mellitus without complications: Secondary | ICD-10-CM

## 2021-12-02 DIAGNOSIS — I10 Essential (primary) hypertension: Secondary | ICD-10-CM

## 2021-12-17 ENCOUNTER — Encounter: Payer: Self-pay | Admitting: Cardiovascular Disease

## 2021-12-17 ENCOUNTER — Ambulatory Visit (INDEPENDENT_AMBULATORY_CARE_PROVIDER_SITE_OTHER): Payer: Medicare PPO | Admitting: Cardiovascular Disease

## 2021-12-17 VITALS — BP 134/80 | HR 75 | Ht 69.0 in | Wt 151.4 lb

## 2021-12-17 DIAGNOSIS — I10 Essential (primary) hypertension: Secondary | ICD-10-CM

## 2021-12-17 DIAGNOSIS — E269 Hyperaldosteronism, unspecified: Secondary | ICD-10-CM | POA: Diagnosis not present

## 2021-12-17 DIAGNOSIS — I509 Heart failure, unspecified: Secondary | ICD-10-CM | POA: Diagnosis not present

## 2021-12-17 DIAGNOSIS — I5043 Acute on chronic combined systolic (congestive) and diastolic (congestive) heart failure: Secondary | ICD-10-CM | POA: Diagnosis not present

## 2021-12-17 DIAGNOSIS — I110 Hypertensive heart disease with heart failure: Secondary | ICD-10-CM | POA: Diagnosis not present

## 2021-12-17 NOTE — Progress Notes (Signed)
? ?Cardiology Office Note ? ? ?Date:  12/17/2021  ? ?ID:  Amber Suarez, DOB [birth year], MRN [id number] ? ?PCP:  Massie Maroon, FNP  ?Cardiologist:   Kristeen Miss, MD  ? ?Chief Complaint  ?Patient presents with  ? Hypertension  ?   ?  ? ?Problem list ?1. Bradycardia ?2. Essential hypertension ?3. History of coronary artery disease ?4. Hyperlipidemia ?5. Diabetes mellitus ?6. Cerebral aneurism with CVA - 1997.  ?  ? ?February 19, 2017 ?Amber Suarez is a 66 y.o. female who is being seen today for the evaluation of bradycardia and HTN  at the request of Massie Maroon, FNP. ?Seen with daughter, [name].  ? ?She has been seen by Dr. Herbie Baltimore in the past. She previously saw Dr. Clarene Duke. ?She had a cardiac catheterization in May, 2013 which revealed nonobstructive coronary artery disease with small vessel ectasia. 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 ? ?She has not had any recent chest pain  ?No syncope or presyncope ? ?She was at her medical doctors office today for routine medical checkup. She was noted have a slow heart rate and hypertension.  BP  164/84 with HR of 54.  She received clonidine 0.2 mg orally at her medical doctors office and her blood pressure is much better.  HR has slowed further ( due to the clonidine )  ? ?February 19, 2017: ? ?Amber Suarez is doing much better in the [place]  ?No CP or dyspnea  ? ?Feb. 25 ,2019: ?BP is up.    ?Feels fine.   Still eating some additional salt .  ?Does not exercise.  Walks on occasion  ? ?December 17, 2021: ?Amber Suarez is seen today for follow-up of her hypertension.  I last saw her in person in 2019.  We did a virtual visit in 2020. ?Still smoking , 1 ppd ?No CP or dyspnea ? ?Has milld LV dysfunction by echo in 2018. EF  45-50% ?Walks some ,   ?Has modertae pulmonary HTN.   ? ? ?Past Medical History:  ?Diagnosis Date  ? CHF (congestive heart failure) (HCC)   ? Diabetes mellitus   ? Heart murmur   ? Hyperlipidemia   ? Hypertension   ? Myocardial infarction [facility])   ? Stroke  [facility])   ? ? ?Past Surgical History:  ?Procedure Laterality Date  ? ABDOMINAL HYSTERECTOMY    ? angoiplasty    ? LEFT HEART CATHETERIZATION WITH CORONARY ANGIOGRAM N/A 01/28/2012  ? Procedure: LEFT HEART CATHETERIZATION WITH CORONARY ANGIOGRAM;  Surgeon: Marykay Lex, MD;  Location: [facility] CATH LAB;  Service: Cardiovascular;  Laterality: N/A;  ? repair of aneursym  1997  ? ? ? ?Current Outpatient Medications  ?Medication Sig Dispense Refill  ? amLODipine (NORVASC) 10 MG tablet TAKE 1 TABLET BY MOUTH DAILY 30 tablet 3  ? aspirin EC 81 MG tablet Take 1 tablet (81 mg total) by mouth daily. Swallow whole. 90 tablet 3  ? Blood Pressure Monitoring DEVI 1 each by Does not apply route daily. 1 Device 0  ? glimepiride (AMARYL) 4 MG tablet TAKE 0.5 TABLETS (2 MG TOTAL) BY MOUTH DAILY WITH BREAKFAST. 30 tablet 1  ? isosorbide mononitrate (IMDUR) 30 MG 24 hr tablet TAKE 1 TABLET BY MOUTH DAILY 30 tablet 3  ? lisinopril (ZESTRIL) 10 MG tablet TAKE 1 TABLET BY MOUTH DAILY 30 tablet 3  ? meloxicam (MOBIC) 7.5 MG tablet Take 7.5 mg by mouth as needed for pain.    ? nitroGLYCERIN (NITROSTAT) 0.4  MG SL tablet Place 0.4 mg under the tongue every 5 (five) minutes as needed. Call 911 if no improvement after 3rd tab    ? potassium chloride SA (K-DUR,KLOR-CON) 20 MEQ tablet Take 1 tablet (20 mEq total) by mouth 2 (two) times daily. 60 tablet 3  ? pravastatin (PRAVACHOL) 40 MG tablet TAKE 1 TABLET BY MOUTH DAILY 30 tablet 1  ? spironolactone (ALDACTONE) 100 MG tablet TAKE 1 TABLET BY MOUTH TWICE A DAY 180 tablet 2  ? ?No current facility-administered medications for this visit.  ? ? ? ?  01/22/2017  ?  1:28 PM  ?PAD Screen  ?Previous PAD dx? No  ?Previous surgical procedure? No  ?Pain with walking? No  ?Feet/toe relief with dangling? No  ?Painful, non-healing ulcers? No  ?Extremities discolored? No  ? ? ? ? ?Allergies:   Morphine and related  ? ? ?Social History:  The patient  reports that she has been smoking cigarettes. She has a 20.00  pack-year smoking history. She has never used smokeless tobacco. She reports that she does not drink alcohol and does not use drugs.  ? ?Family History:  The patient's family history includes Diabetes in her mother; Hypertension in her father, mother, and sister.  ? ? ?ROS: Noted in current history, all other systems are negative. ? ? ?Physical Exam: ?Blood pressure 134/80, pulse 75, height 5\' 9"  (1.753 m), weight 151 lb 6.4 oz (68.7 kg), SpO2 97 %. ? ?GEN:  Well nourished, well developed in no acute distress ?HEENT: Normal ?NECK: No JVD; No carotid bruits ?LYMPHATICS: No lymphadenopathy ?CARDIAC: RRR , no murmurs, rubs, gallops ?RESPIRATORY:  Clear to auscultation without rales, wheezing or rhonchi  ?ABDOMEN: Soft, non-tender, non-distended ?MUSCULOSKELETAL:  No edema; No deformity  ?SKIN: Warm and dry ?NEUROLOGIC:  Alert and oriented x 3 ? ? ?EKG:    December 17, 2021: Normal sinus rhythm at 75.  He has not she has nonspecific ST and T wave changes in the inferior and lateral leads.  There are no changes from previous EKG. ?  ? ?Recent Labs: ?10/29/2021: ALT 5; BUN 14; Creatinine, Ser 1.26; Potassium 4.5; Sodium 142  ? ? ?Lipid Panel ?   ?Component Value Date/Time  ? CHOL 128 10/29/2021 1235  ? TRIG 131 10/29/2021 1235  ? HDL 30 (L) 10/29/2021 1235  ? CHOLHDL 4.3 10/29/2021 1235  ? CHOLHDL 4.7 08/15/2016 0845  ? VLDL 19 08/15/2016 0845  ? LDLCALC 74 10/29/2021 1235  ? LDLDIRECT 133 (H) 07/14/2012 1402  ? ?  ? ?Wt Readings from Last 3 Encounters:  ?12/17/21 151 lb 6.4 oz (68.7 kg)  ?10/29/21 154 lb 8 oz (70.1 kg)  ?01/29/21 151 lb 0.2 oz (68.5 kg)  ?  ? ? ?Other studies Reviewed: ?Additional studies/ records that were reviewed today include: . ?Review of the above records demonstrates:  ? ? ?ASSESSMENT AND PLAN: ? ? ?1. Hypertension: Blood pressures fairly well controlled.  Continue current medications. ? ? ? ?2.  Hyperaldosteronism: Followed by endocrinology ? ? ?3. Sinus Bradycardia:    ?  ?2. Coronary artery disease:  She denies any angina.  I encouraged her to stop smoking. ?  ? ?Current medicines are reviewed at length with the patient today.  The patient does not have concerns regarding medicines. ? ?Labs/ tests ordered today include:  ? ?Orders Placed This Encounter  ?Procedures  ? EKG 12-Lead  ? ECHOCARDIOGRAM COMPLETE  ? ? ?Disposition:   APP in 1 year  ? ? ? ? ?  Kristeen MissPhilip Nneka Blanda, MD  ?12/17/2021 5:56 PM    ?[facility] ?[address], [place], [place]  [address] ?Phone: [phone]; Fax: [phone]  ? ?

## 2021-12-17 NOTE — Patient Instructions (Signed)
Medication Instructions:  ?Your physician recommends that you continue on your current medications as directed. Please refer to the Current Medication list given to you today. ? ?*If you need a refill on your cardiac medications before your next appointment, please call your pharmacy* ? ?Lab Work: ?NONE ?If you have labs (blood work) drawn today and your tests are completely normal, you will receive your results only by: ?MyChart Message (if you have MyChart) OR ?A paper copy in the mail ?If you have any lab test that is abnormal or we need to change your treatment, we will call you to review the results. ? ?Testing/Procedures: ?Your physician has requested that you have an echocardiogram. Echocardiography is a painless test that uses sound waves to create images of your heart. It provides your doctor with information about the size and shape of your heart and how well your heart?s chambers and valves are working. This procedure takes approximately one hour. There are no restrictions for this procedure. ? ?Follow-Up: ?At [facility], you and your health needs are our priority.  As part of our continuing mission to provide you with exceptional heart care, we have created designated Provider Care Teams.  These Care Teams include your primary Cardiologist (physician) and Advanced Practice Providers (APPs -  Physician Assistants and Nurse Practitioners) who all work together to provide you with the care you need, when you need it. ? ?Your next appointment:   ?1 year(s) ? ?The format for your next appointment:   ?In Person ? ?Provider:   ?Chelsea Aus, PA-C or Tereso Newcomer, PA-C ?

## 2021-12-31 ENCOUNTER — Other Ambulatory Visit: Payer: Self-pay | Admitting: Family Medicine

## 2021-12-31 DIAGNOSIS — I10 Essential (primary) hypertension: Secondary | ICD-10-CM

## 2022-01-03 ENCOUNTER — Ambulatory Visit (HOSPITAL_COMMUNITY): Payer: Medicare HMO | Attending: Internal Medicine

## 2022-01-03 DIAGNOSIS — I10 Essential (primary) hypertension: Secondary | ICD-10-CM | POA: Insufficient documentation

## 2022-01-03 DIAGNOSIS — I5043 Acute on chronic combined systolic (congestive) and diastolic (congestive) heart failure: Secondary | ICD-10-CM | POA: Diagnosis not present

## 2022-01-03 LAB — ECHOCARDIOGRAM COMPLETE
Area-P 1/2: 2.08 cm2
P 1/2 time: 835 msec
S' Lateral: 2.4 cm

## 2022-02-13 ENCOUNTER — Other Ambulatory Visit: Payer: Self-pay | Admitting: Family Medicine

## 2022-02-13 ENCOUNTER — Other Ambulatory Visit: Payer: Self-pay | Admitting: Cardiovascular Disease

## 2022-02-13 DIAGNOSIS — E119 Type 2 diabetes mellitus without complications: Secondary | ICD-10-CM

## 2022-02-14 ENCOUNTER — Telehealth: Payer: Self-pay | Admitting: Cardiovascular Disease

## 2022-02-14 MED ORDER — SPIRONOLACTONE 100 MG PO TABS: 100.0000 mg | ORAL_TABLET | Freq: Two times a day (BID) | ORAL | 3 refills | Status: DC

## 2022-02-14 NOTE — Telephone Encounter (Signed)
Pt's medication was sent to pt's pharmacy as requested. Confirmation received.  °

## 2022-02-14 NOTE — Telephone Encounter (Signed)
*  STAT* If patient is at the pharmacy, call can be transferred to refill team.   1. Which medications need to be refilled? (please list name of each medication and dose if known) spironolactone (ALDACTONE) 100 MG tablet  2. Which pharmacy/location (including street and city if local pharmacy) is medication to be sent to? TAKE 1 TABLET BY MOUTH TWICE A DAY  3. Do they need a 30 day or 90 day supply? [place]

## 2022-04-28 ENCOUNTER — Ambulatory Visit (INDEPENDENT_AMBULATORY_CARE_PROVIDER_SITE_OTHER): Payer: Medicare HMO | Admitting: Family Medicine

## 2022-04-28 ENCOUNTER — Encounter: Payer: Self-pay | Admitting: Family Medicine

## 2022-04-28 VITALS — BP 145/90 | HR 60 | Temp 97.2°F | Ht 69.0 in | Wt 150.8 lb

## 2022-04-28 DIAGNOSIS — F17200 Nicotine dependence, unspecified, uncomplicated: Secondary | ICD-10-CM | POA: Diagnosis not present

## 2022-04-28 DIAGNOSIS — E119 Type 2 diabetes mellitus without complications: Secondary | ICD-10-CM | POA: Diagnosis not present

## 2022-04-28 DIAGNOSIS — R82998 Other abnormal findings in urine: Secondary | ICD-10-CM

## 2022-04-28 DIAGNOSIS — I10 Essential (primary) hypertension: Secondary | ICD-10-CM

## 2022-04-28 DIAGNOSIS — E785 Hyperlipidemia, unspecified: Secondary | ICD-10-CM

## 2022-04-28 LAB — POCT URINALYSIS DIP (CLINITEK)
Blood, UA: NEGATIVE
Glucose, UA: NEGATIVE mg/dL
Leukocytes, UA: NEGATIVE
Nitrite, UA: NEGATIVE
Spec Grav, UA: 1.02 (ref 1.010–1.025)
Urobilinogen, UA: 2 U/dL — AB
pH, UA: 5.5 (ref 5.0–8.0)

## 2022-04-28 LAB — POCT GLYCOSYLATED HEMOGLOBIN (HGB A1C)
HbA1c POC (<> result, manual entry): 6.4 % (ref 4.0–5.6)
HbA1c, POC (controlled diabetic range): 6.4 % (ref 0.0–7.0)
HbA1c, POC (prediabetic range): 6.4 % (ref 5.7–6.4)
Hemoglobin A1C: 6.4 % — AB (ref 4.0–5.6)

## 2022-04-28 NOTE — Patient Instructions (Signed)
For your next appointment, please come fasting for 8 hours

## 2022-04-28 NOTE — Progress Notes (Signed)
Patient [facility]   Established Patient Office Visit  Subjective   Patient ID: DACI STUBBE, female    DOB: Jul 13, 1956  Age: 66 y.o. MRN: [id number]  Chief Complaint  Patient presents with   Follow-up    Pt is here for DM follow up visit. Pt states she has no appetite pt is requesting refill on all medications    HPI Basha Couse is a very pleasant 66 year old female with a medical history significant for essential hypertension, hyperaldosteronism, type 2 diabetes mellitus, and tobacco dependence presents for follow-up of her chronic conditions.  Patient states that she has been doing very well and is without complaint on today.  She has a long history of hypertension.  She does not check her blood pressure at home.  Patient states that she took her antihypertensive medications prior to arrival.  She denies any headache, dizziness, heart palpitations, chest pain, shortness of breath, or lower extremity swelling. Patient also has a history of type 2 diabetes mellitus.  Her most recent hemoglobin A1c is 6.8.  Patient has been following a low-fat, low carbohydrate diet and typically prepares her own meals.  She does not exercise.  Today, she denies polyuria, polydipsia, or polyphagia.  Patient checks her feet regularly.  She does not go to podiatry.  She is past due for her eye exam.  She states that she will make an appointment. Patient is a chronic everyday tobacco user.  She has been smoking for well over 40 years.  Patient states that she has decreased her daily smoking to 1 pack/day.  She is not interested in quitting at this time. Patient Active Problem List   Diagnosis Date Noted   Hypokalemia 03/12/2017   Hyperaldosteronism ([place]) 01/22/2017   Hypertension due to endocrine disorder 01/22/2017   Other malaise and fatigue 10/27/2013   History of MI (myocardial infarction) 10/27/2013   Unstable angina ([place]) 01/28/2012   Insomnia 11/26/2010    Diabetes mellitus type 2, controlled ([place]) 05/11/2009   Overweight(278.02) 06/15/2008   HYPERTENSION, BENIGN ESSENTIAL 11/20/2006   HLD (hyperlipidemia) 11/12/2006   TOBACCO DEPENDENCE 11/12/2006   CVA 11/12/2006   Past Medical History:  Diagnosis Date   CHF (congestive heart failure) (HCC)    Diabetes mellitus    Heart murmur    Hyperlipidemia    Hypertension    Myocardial infarction ([place])    Stroke [facility])    Past Surgical History:  Procedure Laterality Date   ABDOMINAL HYSTERECTOMY     angoiplasty     LEFT HEART CATHETERIZATION WITH CORONARY ANGIOGRAM N/A 01/28/2012   Procedure: LEFT HEART CATHETERIZATION WITH CORONARY ANGIOGRAM;  Surgeon: Leonie Man, MD;  Location[facility] C[facility] LAB;  Service: Cardiovascular;  Laterality: N/A;   repair of aneursym  1997   Social History   Tobacco Use   Smoking status: Every Day    Packs/day: 0.50    Years: 40.00    Total pack years: 20.00    Types: Cigarettes   Smokeless tobacco: Never  Vaping Use   Vaping Use: Never used  Substance Use Topics   Alcohol use: No   Drug use: No   Social History   Socioeconomic History   Marital status: Divorced    Spouse name: Not on file   Number of children: Not on file   Years of education: Not on file   Highest education level: Not on file  Occupational History   Not on file  Tobacco Use  Smoking status: Every Day    Packs/day: 0.50    Years: 40.00    Total pack years: 20.00    Types: Cigarettes   Smokeless tobacco: Never  Vaping Use   Vaping Use: Never used  Substance and Sexual Activity   Alcohol use: No   Drug use: No   Sexual activity: Not Currently    Birth control/protection: Surgical  Other Topics Concern   Not on file  Social History Narrative   Not on file   Social Determinants of Health   Financial Resource Strain: Not on file  Food Insecurity: Not on file  Transportation Needs: Not on file  Physical Activity: Not on file  Stress: Not on file  Social  Connections: Not on file  Intimate Partner Violence: Not on file   Family Status  Relation Name Status   Mother  Deceased   Father  Deceased   Sister  Alive   Family History  Problem Relation Age of Onset   Diabetes Mother    Hypertension Mother    Hypertension Father    Hypertension Sister    Allergies  Allergen Reactions   Morphine And Related Itching and Other (See Comments)    burning      Review of Systems  Constitutional: Negative.   HENT: Negative.    Respiratory: Negative.    Cardiovascular: Negative.   Genitourinary: Negative.   Musculoskeletal: Negative.   Skin: Negative.   Neurological: Negative.       Objective:     BP (!) 145/90 (BP Location: Left Arm, Patient Position: Sitting, Cuff Size: Normal)   Pulse 60   Temp (!) 97.2 F (36.2 C)   Ht '5\' 9"'  (1.753 m)   Wt 150 lb 12.8 oz (68.4 kg)   SpO2 100%   BMI 22.27 kg/m  BP Readings from Last 3 Encounters:  04/28/22 (!) 145/90  12/17/21 134/80  10/29/21 126/78   Wt Readings from Last 3 Encounters:  04/28/22 150 lb 12.8 oz (68.4 kg)  12/17/21 151 lb 6.4 oz (68.7 kg)  10/29/21 154 lb 8 oz (70.1 kg)      Physical Exam Constitutional:      Appearance: Normal appearance.  Eyes:     Pupils: Pupils are equal, round, and reactive to light.  Cardiovascular:     Rate and Rhythm: Normal rate and regular rhythm.  Pulmonary:     Effort: Pulmonary effort is normal.  Musculoskeletal:        General: Normal range of motion.  Skin:    General: Skin is warm.  Neurological:     General: No focal deficit present.     Mental Status: She is alert.  Psychiatric:        Mood and Affect: Mood normal.        Behavior: Behavior normal.        Thought Content: Thought content normal.        Judgment: Judgment normal.      Results for orders placed or performed in visit on 04/28/22  POCT glycosylated hemoglobin (Hb A1C)  Result Value Ref Range   Hemoglobin A1C 6.4 (A) 4.0 - 5.6 %   HbA1c POC (<>  result, manual entry) 6.4 4.0 - 5.6 %   HbA1c, POC (prediabetic range) 6.4 5.7 - 6.4 %   HbA1c, POC (controlled diabetic range) 6.4 0.0 - 7.0 %  POCT URINALYSIS DIP (CLINITEK)  Result Value Ref Range   Color, UA yellow yellow   Clarity, UA clear clear  Glucose, UA negative negative mg/dL   Bilirubin, UA small (A) negative   Ketones, POC UA trace (5) (A) negative mg/dL   Spec Grav, UA 1.020 1.010 - 1.025   Blood, UA negative negative   pH, UA 5.5 5.0 - 8.0   POC PROTEIN,UA trace negative, trace   Urobilinogen, UA 2.0 (A) 0.2 or 1.0 E.U./dL   Nitrite, UA Negative Negative   Leukocytes, UA Negative Negative    Last CBC Lab Results  Component Value Date   WBC 5.5 05/17/2020   HGB 13.9 05/17/2020   HCT 41.3 05/17/2020   MCV 87 05/17/2020   MCH 29.2 05/17/2020   RDW 13.8 05/17/2020   PLT 158 66/59/9357   Last metabolic panel Lab Results  Component Value Date   GLUCOSE 87 10/29/2021   NA 142 10/29/2021   K 4.5 10/29/2021   CL 105 10/29/2021   CO2 25 10/29/2021   BUN 14 10/29/2021   CREATININE 1.26 (H) 10/29/2021   EGFR 47 (L) 10/29/2021   CALCIUM 9.3 10/29/2021   PHOS 2.8 10/11/2009   PROT 7.3 10/29/2021   ALBUMIN 4.6 10/29/2021   LABGLOB 3.0 10/30/2020   AGRATIO 1.5 10/30/2020   BILITOT 0.3 10/29/2021   ALKPHOS 81 10/29/2021   AST 12 10/29/2021   ALT 5 10/29/2021   ANIONGAP 10 05/04/2020   Last lipids Lab Results  Component Value Date   CHOL 128 10/29/2021   HDL 30 (L) 10/29/2021   LDLCALC 74 10/29/2021   LDLDIRECT 133 (H) 07/14/2012   TRIG 131 10/29/2021   CHOLHDL 4.3 10/29/2021   Last hemoglobin A1c Lab Results  Component Value Date   HGBA1C 6.4 (A) 04/28/2022   HGBA1C 6.4 04/28/2022   HGBA1C 6.4 04/28/2022   HGBA1C 6.4 04/28/2022   Last thyroid functions Lab Results  Component Value Date   TSH 1.15 01/16/2017   Last vitamin D No results found for: "25OHVITD2", "25OHVITD3", "VD25OH" Last vitamin B12 and Folate No results found for:  "VITAMINB12", "FOLATE"    The ASCVD Risk score (Arnett DK, et al., 2019) failed to calculate for the following reasons:   The valid total cholesterol range is 130 to 320 mg/dL    Assessment & Plan:   Problem List Items Addressed This Visit       Cardiovascular and Mediastinum   HYPERTENSION, BENIGN ESSENTIAL   Relevant Orders   Comprehensive metabolic panel     Endocrine   Diabetes mellitus type 2, controlled ([place])   Relevant Orders   POCT glycosylated hemoglobin (Hb A1C) (Completed)   POCT URINALYSIS DIP (CLINITEK) (Completed)   Comprehensive metabolic panel     Other   HLD (hyperlipidemia)   Relevant Orders   Comprehensive metabolic panel   Other Visit Diagnoses     Leukocytes in urine    -  Primary   Relevant Orders   POCT URINALYSIS DIP (CLINITEK) (Completed)   Tobacco dependence          Patient's hemoglobin A1c has decreased to 6.4.  Patient commended on her efforts to improve type 2 diabetes.  Patient advised to continue a carbohydrate modified diet divided over small meals throughout the day.  Also, recommended a brisk walk several days per week. Patient was advised to continue to work on smoking cessation.  She is not interested in starting any medication or programs for smoking sensation. There will be no medication changes today.  We will follow-up with patient in 6 months. The patient is due for colonoscopy, referral previously sent.  Patient states that she will need to make an appointment. Return in about 6 months (around 10/29/2022) for diabetes, hypertension.    Donia Pounds  APRN, MSN, FNP-C [facility] [place] [address] [place], [place] [address] [phone]

## 2022-04-29 ENCOUNTER — Ambulatory Visit: Payer: Medicare PPO | Admitting: Family Medicine

## 2022-04-29 LAB — COMPREHENSIVE METABOLIC PANEL
ALT: 9 IU/L (ref 0–32)
AST: 7 IU/L (ref 0–40)
Albumin/Globulin Ratio: 1.7 (ref 1.2–2.2)
Albumin: 4.4 g/dL (ref 3.9–4.9)
Alkaline Phosphatase: 83 IU/L (ref 44–121)
BUN/Creatinine Ratio: 10 — ABNORMAL LOW (ref 12–28)
BUN: 12 mg/dL (ref 8–27)
Bilirubin Total: 0.4 mg/dL (ref 0.0–1.2)
CO2: 22 mmol/L (ref 20–29)
Calcium: 9.2 mg/dL (ref 8.7–10.3)
Chloride: 102 mmol/L (ref 96–106)
Creatinine, Ser: 1.15 mg/dL — ABNORMAL HIGH (ref 0.57–1.00)
Globulin, Total: 2.6 g/dL (ref 1.5–4.5)
Glucose: 103 mg/dL — ABNORMAL HIGH (ref 70–99)
Potassium: 4.2 mmol/L (ref 3.5–5.2)
Sodium: 142 mmol/L (ref 134–144)
Total Protein: 7 g/dL (ref 6.0–8.5)
eGFR: 52 mL/min/{1.73_m2} — ABNORMAL LOW (ref >59)

## 2022-05-02 ENCOUNTER — Other Ambulatory Visit: Payer: Self-pay | Admitting: Family Medicine

## 2022-05-02 DIAGNOSIS — E119 Type 2 diabetes mellitus without complications: Secondary | ICD-10-CM

## 2022-05-02 DIAGNOSIS — I10 Essential (primary) hypertension: Secondary | ICD-10-CM

## 2022-07-02 ENCOUNTER — Other Ambulatory Visit: Payer: Self-pay | Admitting: Family Medicine

## 2022-07-02 DIAGNOSIS — E119 Type 2 diabetes mellitus without complications: Secondary | ICD-10-CM

## 2022-08-12 IMAGING — MG DIGITAL SCREENING BILAT W/ TOMO W/ CAD
8 series · 9 of 24 positions shown · non-contrast
Comparison: Previous exam(s).

CLINICAL DATA: Screening.

EXAM:
DIGITAL SCREENING BILATERAL MAMMOGRAM WITH TOMO AND CAD

[R CC synth-2D]
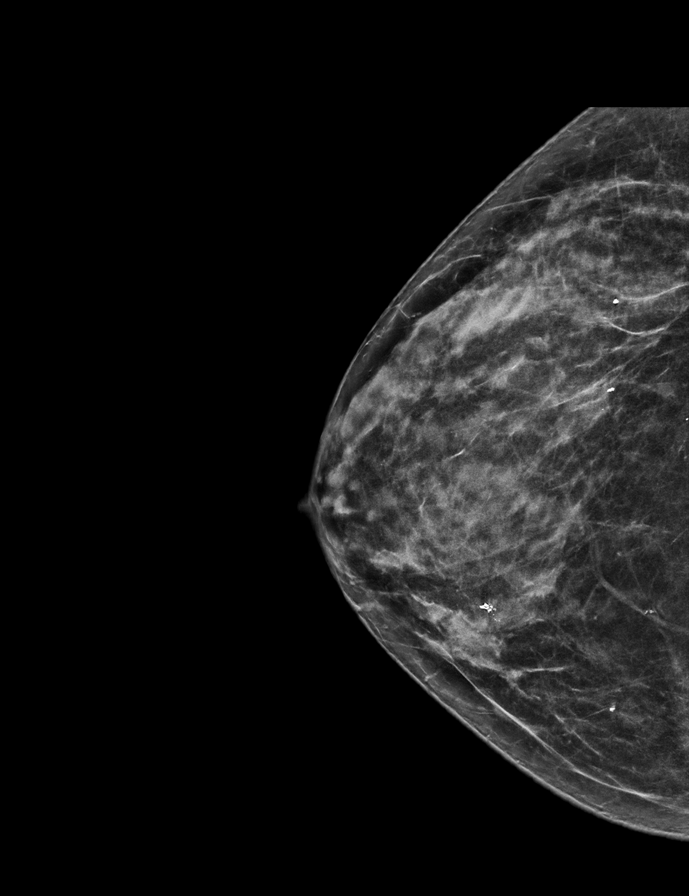

[L CC synth-2D]
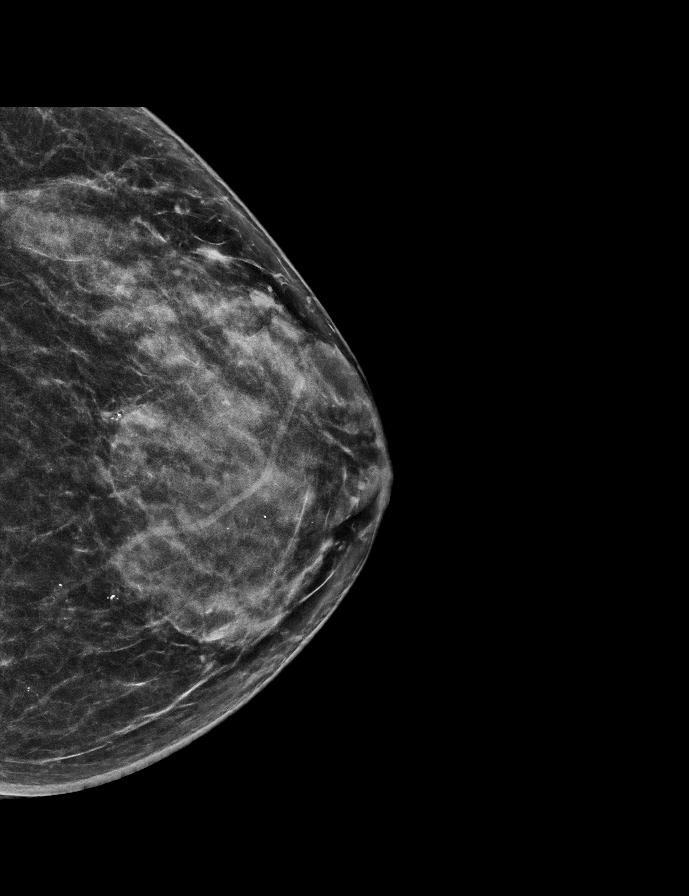

[L MLO synth-2D]
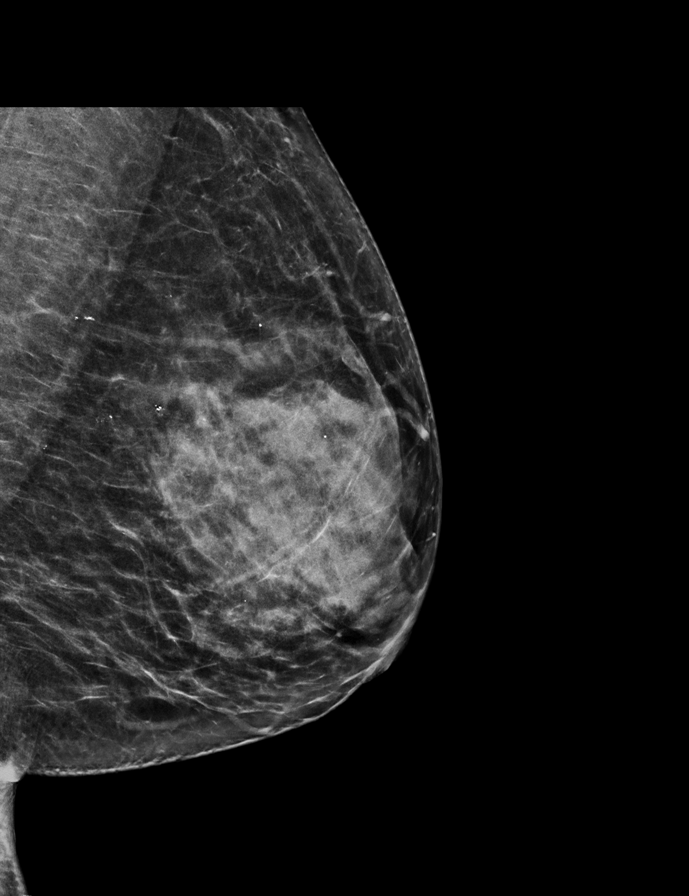

[R MLO synth-2D]
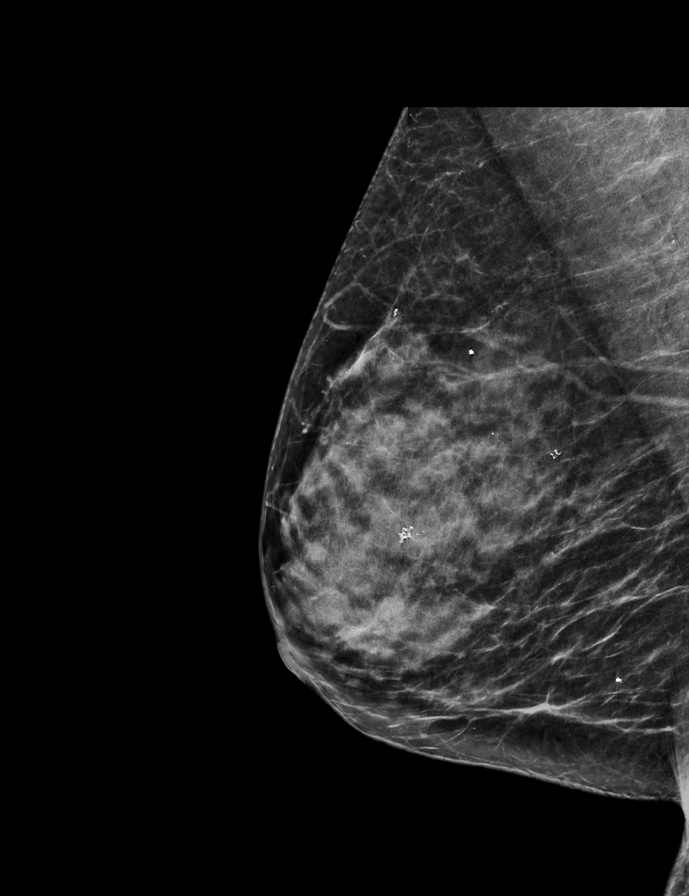

[L MLO tomo · 2 of 60 frames shown]
[frame 20/60]
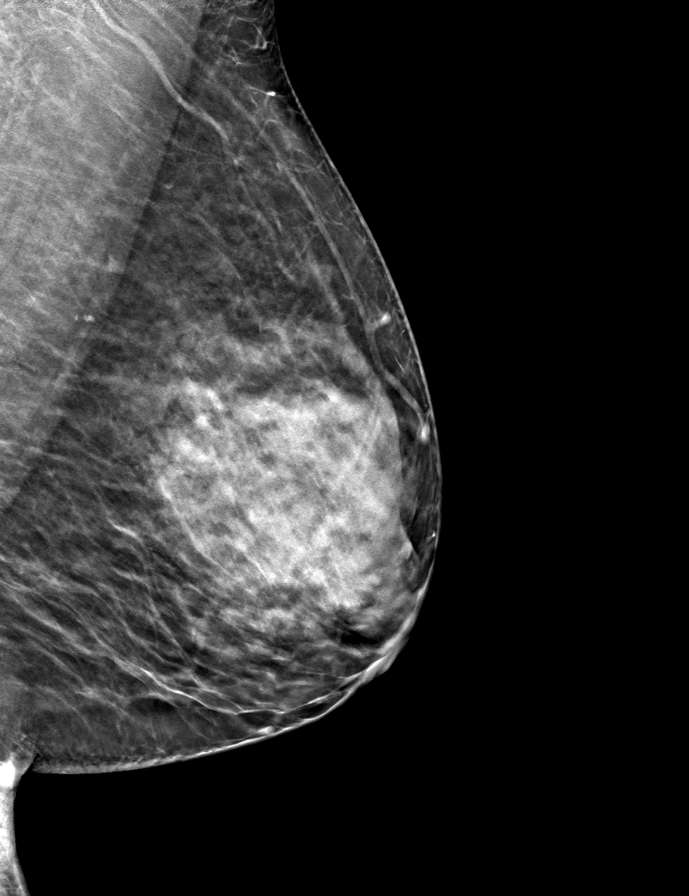
[frame 31/60]
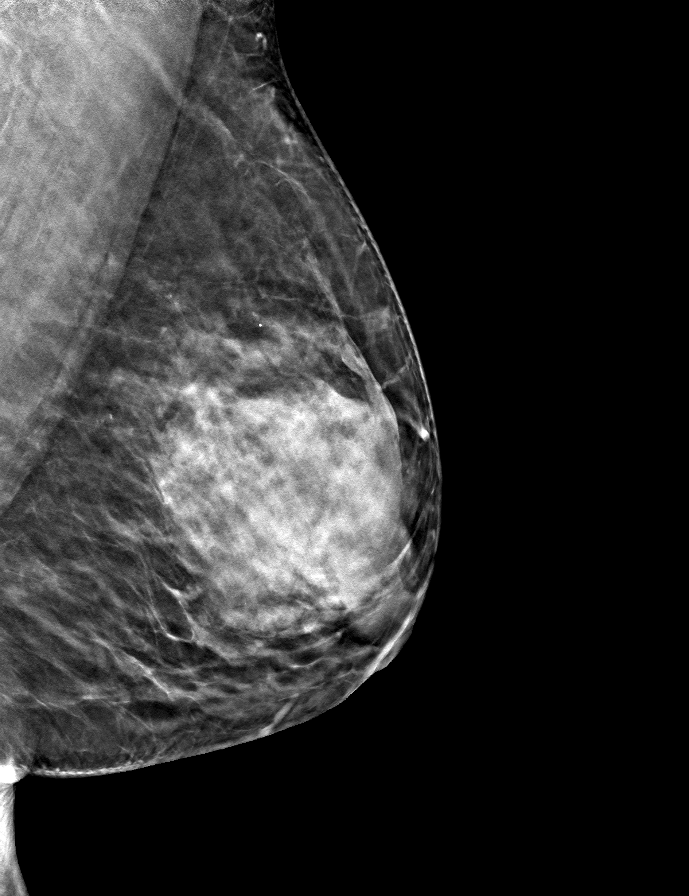

[L CC tomo · tomo slice 31/60.0]
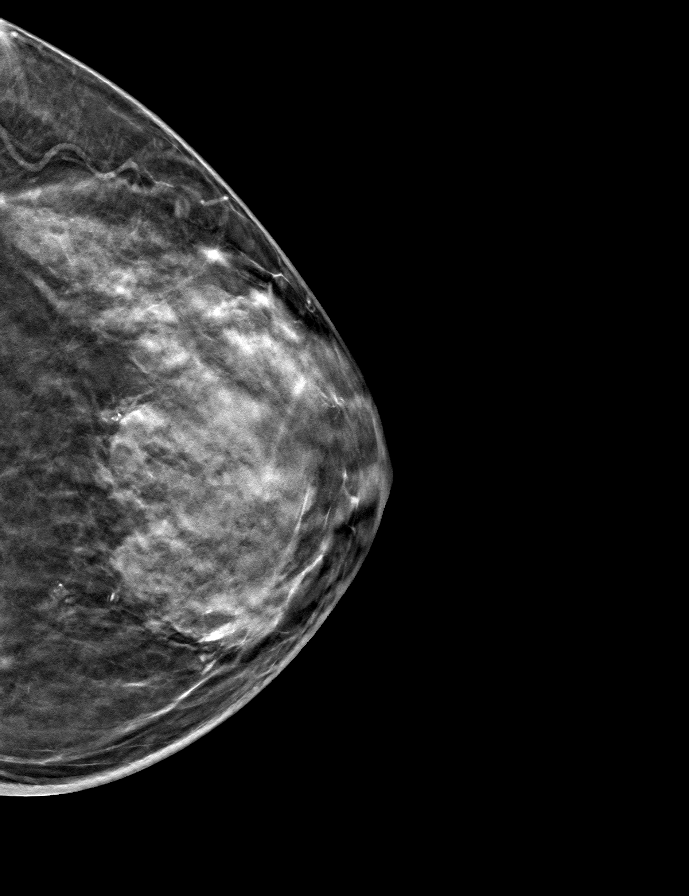

[R CC tomo · tomo slice 29/57.0]
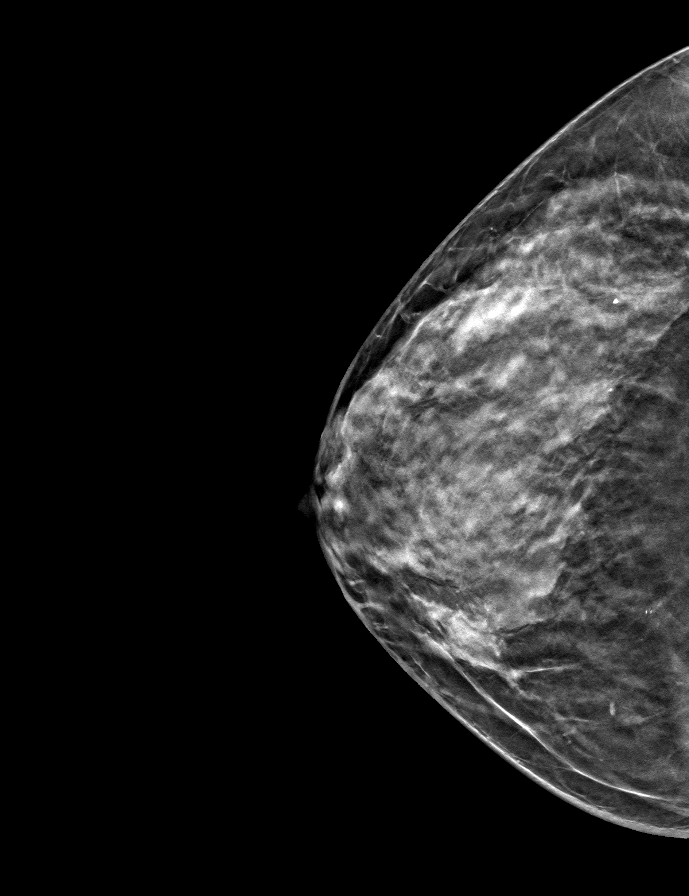

[R MLO tomo · tomo slice 31/60.0]
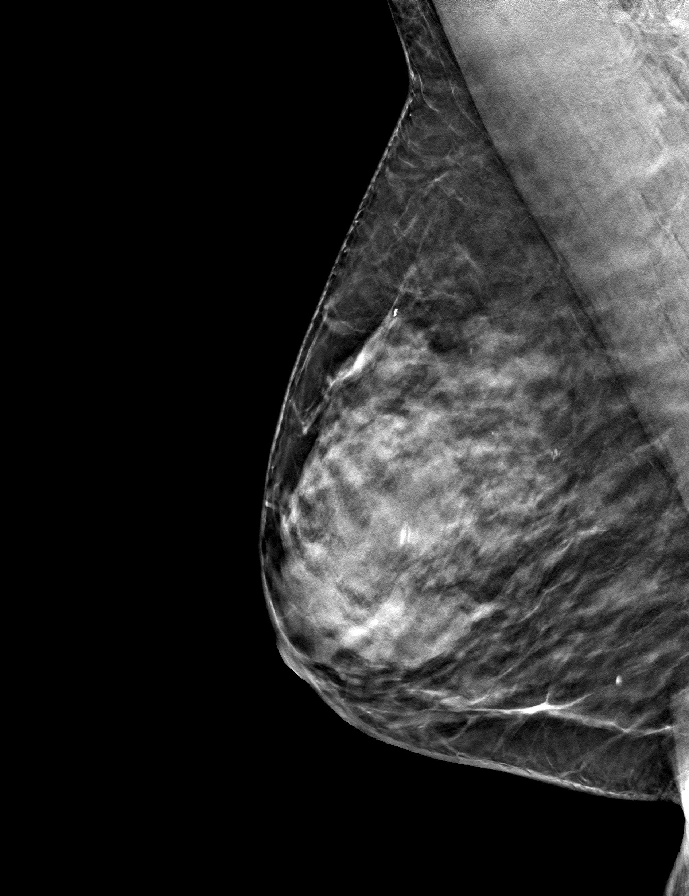

[9 of 24 positions shown; findings below may reference images not displayed]

ACR Breast Density Category d: The breast tissue is extremely dense,
which lowers the sensitivity of mammography
FINDINGS: There are no findings suspicious for malignancy. Images were
processed with CAD.
IMPRESSION: No mammographic evidence of malignancy. A result letter of this
screening mammogram will be mailed directly to the patient.

RECOMMENDATION:
Screening mammogram in one year. (Code:WO-0-ZI0)

BI-RADS CATEGORY  1: Negative.

## 2022-08-28 ENCOUNTER — Telehealth: Payer: Self-pay | Admitting: Family Medicine

## 2022-08-28 ENCOUNTER — Other Ambulatory Visit: Payer: Self-pay

## 2022-08-28 DIAGNOSIS — I10 Essential (primary) hypertension: Secondary | ICD-10-CM

## 2022-08-28 MED ORDER — LISINOPRIL 10 MG PO TABS: 10.0000 mg | ORAL_TABLET | Freq: Every day | ORAL | 1 refills | Status: DC

## 2022-08-28 NOTE — Telephone Encounter (Signed)
Caller & Relationship to patient:  MRN #  [id number]   Call Back Number:   Date of Last Office Visit: 07/02/2022     Date of Next Office Visit: 09/23/2022    Medication(s) to be Refilled: Lisinopril   Preferred Pharmacy:   ** Please notify patient to allow 48-72 hours to process** **Let patient know to contact pharmacy at the end of the day to make sure medication is ready. ** **If patient has not been seen in a year or longer, book an appointment **Advise to use MyChart for refill requests OR to contact their pharmacy

## 2022-08-28 NOTE — Telephone Encounter (Signed)
Done KH 

## 2022-08-28 NOTE — Telephone Encounter (Signed)
[facility] on [address]

## 2022-09-23 ENCOUNTER — Encounter: Payer: Self-pay | Admitting: Family Medicine

## 2022-09-23 ENCOUNTER — Ambulatory Visit (INDEPENDENT_AMBULATORY_CARE_PROVIDER_SITE_OTHER): Payer: Medicare HMO | Admitting: Family Medicine

## 2022-09-23 VITALS — BP 125/69 | HR 63 | Temp 97.5°F | Ht 69.0 in | Wt 147.8 lb

## 2022-09-23 DIAGNOSIS — I509 Heart failure, unspecified: Secondary | ICD-10-CM | POA: Diagnosis not present

## 2022-09-23 DIAGNOSIS — I10 Essential (primary) hypertension: Secondary | ICD-10-CM | POA: Diagnosis not present

## 2022-09-23 DIAGNOSIS — E119 Type 2 diabetes mellitus without complications: Secondary | ICD-10-CM | POA: Diagnosis not present

## 2022-09-23 DIAGNOSIS — F17200 Nicotine dependence, unspecified, uncomplicated: Secondary | ICD-10-CM | POA: Diagnosis not present

## 2022-09-23 DIAGNOSIS — Z1231 Encounter for screening mammogram for malignant neoplasm of breast: Secondary | ICD-10-CM

## 2022-09-23 DIAGNOSIS — E785 Hyperlipidemia, unspecified: Secondary | ICD-10-CM | POA: Diagnosis not present

## 2022-09-23 DIAGNOSIS — Z122 Encounter for screening for malignant neoplasm of respiratory organs: Secondary | ICD-10-CM

## 2022-09-23 DIAGNOSIS — I110 Hypertensive heart disease with heart failure: Secondary | ICD-10-CM | POA: Diagnosis not present

## 2022-09-23 LAB — POCT GLYCOSYLATED HEMOGLOBIN (HGB A1C): Hemoglobin A1C: 6.2 % — AB (ref 4.0–5.6)

## 2022-09-23 MED ORDER — GLIMEPIRIDE 4 MG PO TABS: ORAL_TABLET | ORAL | 1 refills | Status: DC

## 2022-09-23 MED ORDER — ISOSORBIDE MONONITRATE ER 30 MG PO TB24: 30.0000 mg | ORAL_TABLET | Freq: Every day | ORAL | 3 refills | Status: DC

## 2022-09-23 MED ORDER — AMLODIPINE BESYLATE 10 MG PO TABS: 10.0000 mg | ORAL_TABLET | Freq: Every day | ORAL | 3 refills | Status: DC

## 2022-09-23 NOTE — Patient Instructions (Signed)
We want to make sure that you are up-to-date with all your screenings. 1.  Breast cancer screening, I have placed an order for a mammogram.  Contact [facility] to schedule your mammogram. 2.  Lung cancer screening.  I have placed an order for a low-dose CT scan.  You can show up at the front of [facility] and let them know that you are here for a low-dose CT scan.  You can call to see if they take appointments but it may be easier to walk in.   Your hemoglobin A1c is 6.2, which is improved from previous.  You are at goal.  Continue your current medication regimen without any changes today.

## 2022-09-23 NOTE — Progress Notes (Signed)
Established Patient Office Visit  Subjective   Patient ID: Amber Suarez, female    DOB: 01/17/1956  Age: 67 y.o. MRN: [id number]  Chief Complaint  Patient presents with   Diabetes     Follow up    Amber Suarez is a very pleasant 67 year old female with a medical history significant for type 2 diabetes mellitus, hypertension, hyperlipidemia, and tobacco dependence that presents for follow-up of chronic conditions.  Patient says that she has been doing well and does not have any complaints on today.  Diabetes She presents for her follow-up diabetic visit. She has type 2 diabetes mellitus. Her disease course has been stable. Pertinent negatives for hypoglycemia include no headaches or sweats. Pertinent negatives for diabetes include no blurred vision and no chest pain.  Hypertension This is a chronic problem. The problem is controlled. Pertinent negatives include no blurred vision, chest pain, headaches, orthopnea, palpitations, peripheral edema, PND, shortness of breath or sweats.    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   Other malaise and fatigue 10/27/2013   History of MI (myocardial infarction) 10/27/2013   Unstable angina (HCC) 01/28/2012   Insomnia 11/26/2010   Diabetes mellitus type 2, controlled (HCC) 05/11/2009   Overweight(278.02) 06/15/2008   HYPERTENSION, BENIGN ESSENTIAL 11/20/2006   HLD (hyperlipidemia) 11/12/2006   TOBACCO DEPENDENCE 11/12/2006   CVA 11/12/2006   Past Medical History:  Diagnosis Date   CHF (congestive heart failure) (HCC)    Diabetes mellitus    Heart murmur    Hyperlipidemia    Hypertension    Myocardial infarction (HCC)    Stroke [facility])    Past Surgical History:  Procedure Laterality Date   ABDOMINAL HYSTERECTOMY     angoiplasty     LEFT HEART CATHETERIZATION WITH CORONARY ANGIOGRAM N/A 01/28/2012    Procedure: LEFT HEART CATHETERIZATION WITH CORONARY ANGIOGRAM;  Surgeon: Marykay Lex, MD;  Location: [facility] CATH LAB;  Service: Cardiovascular;  Laterality: N/A;   repair of aneursym  1997   Social History   Tobacco Use   Smoking status: Every Day    Packs/day: 0.50    Years: 40.00    Total pack years: 20.00    Types: Cigarettes   Smokeless tobacco: Never  Vaping Use   Vaping Use: Never used  Substance Use Topics   Alcohol use: No   Drug use: No   Social History   Socioeconomic History   Marital status: Divorced    Spouse name: Not on file   Number of children: Not on file   Years of education: Not on file   Highest education level: Not on file  Occupational History   Not on file  Tobacco Use   Smoking status: Every Day    Packs/day: 0.50    Years: 40.00    Total pack years: 20.00    Types: Cigarettes   Smokeless tobacco: Never  Vaping Use   Vaping Use: Never used  Substance and Sexual Activity   Alcohol use: No   Drug use: No   Sexual activity: Not Currently    Birth control/protection: Surgical  Other Topics Concern   Not on file  Social History Narrative   Not on file   Social Determinants of Health   Financial Resource Strain: Not on file  Food Insecurity: Not on file  Transportation Needs: Not on file  Physical Activity: Not on file  Stress: Not on file  Social Connections: Not on file  Intimate Partner Violence: Not on file   Family Status  Relation Name Status   Mother  Deceased   Father  Deceased   Sister  Alive   Family History  Problem Relation Age of Onset   Diabetes Mother    Hypertension Mother    Hypertension Father    Hypertension Sister    Allergies  Allergen Reactions   Morphine And Related Itching and Other (See Comments)    burning      Review of Systems  HENT: Negative.    Eyes:  Negative for blurred vision.  Respiratory: Negative.  Negative for shortness of breath.   Cardiovascular: Negative.  Negative for chest  pain, palpitations, orthopnea and PND.  Gastrointestinal: Negative.   Genitourinary: Negative.   Musculoskeletal:  Positive for back pain and joint pain.  Neurological: Negative.  Negative for headaches.  Psychiatric/Behavioral: Negative.        Objective:     BP 125/69   Pulse 63   Temp (!) 97.5 F (36.4 C)   Ht 5\' 9"  (1.753 m)   Wt 147 lb 12.8 oz (67 kg)   SpO2 100%   BMI 21.83 kg/m  BP Readings from Last 3 Encounters:  09/23/22 125/69  04/28/22 (!) 145/90  12/17/21 134/80   Wt Readings from Last 3 Encounters:  09/23/22 147 lb 12.8 oz (67 kg)  04/28/22 150 lb 12.8 oz (68.4 kg)  12/17/21 151 lb 6.4 oz (68.7 kg)      Physical Exam Eyes:     Pupils: Pupils are equal, round, and reactive to light.  Cardiovascular:     Rate and Rhythm: Normal rate and regular rhythm.  Pulmonary:     Effort: Pulmonary effort is normal.  Abdominal:     General: Bowel sounds are normal.  Skin:    General: Skin is warm.  Neurological:     General: No focal deficit present.     Mental Status: Mental status is at baseline.  Psychiatric:        Mood and Affect: Mood normal.        Behavior: Behavior normal.        Thought Content: Thought content normal.        Judgment: Judgment normal.      Results for orders placed or performed in visit on 09/23/22  Microalbumin/Creatinine Ratio, Urine  Result Value Ref Range   Creatinine, Urine 153.9 Not Estab. mg/dL   Microalbumin, Urine 11/22/22 Not Estab. ug/mL   Microalb/Creat Ratio 11 0 - 29 mg/g creat  Lipid Panel  Result Value Ref Range   Cholesterol, Total 140 100 - 199 mg/dL   Triglycerides 91 0 - 149 mg/dL   HDL 33 (L) 37.9 mg/dL   VLDL Cholesterol Cal 17 5 - 40 mg/dL   LDL Chol Calc (NIH) 90 0 - 99 mg/dL   Chol/HDL Ratio 4.2 0.0 - 4.4 ratio  Comprehensive metabolic panel  Result Value Ref Range   Glucose 106 (H) 70 - 99 mg/dL   BUN 12 8 - 27 mg/dL   Creatinine, Ser >02 (H) 0.57 - 1.00 mg/dL   eGFR 49 (L) 4.09 >73    BUN/Creatinine Ratio 10 (L) 12 - 28   Sodium 143 134 - 144 mmol/L   Potassium 4.4 3.5 - 5.2 mmol/L   Chloride 103 96 - 106 mmol/L   CO2 23 20 - 29 mmol/L   Calcium 9.4 8.7 - 10.3 mg/dL  Total Protein 7.1 6.0 - 8.5 g/dL   Albumin 4.4 3.9 - 4.9 g/dL   Globulin, Total 2.7 1.5 - 4.5 g/dL   Albumin/Globulin Ratio 1.6 1.2 - 2.2   Bilirubin Total 0.4 0.0 - 1.2 mg/dL   Alkaline Phosphatase 72 44 - 121 IU/L   AST 10 0 - 40 IU/L   ALT 8 0 - 32 IU/L  POCT glycosylated hemoglobin (Hb A1C)  Result Value Ref Range   Hemoglobin A1C 6.2 (A) 4.0 - 5.6 %   HbA1c POC (<> result, manual entry)     HbA1c, POC (prediabetic range)     HbA1c, POC (controlled diabetic range)      Last CBC Lab Results  Component Value Date   WBC 5.5 05/17/2020   HGB 13.9 05/17/2020   HCT 41.3 05/17/2020   MCV 87 05/17/2020   MCH 29.2 05/17/2020   RDW 13.8 05/17/2020   PLT 158 42/68/3419   Last metabolic panel Lab Results  Component Value Date   GLUCOSE 106 (H) 09/23/2022   NA 143 09/23/2022   K 4.4 09/23/2022   CL 103 09/23/2022   CO2 23 09/23/2022   BUN 12 09/23/2022   CREATININE 1.20 (H) 09/23/2022   EGFR 49 (L) 09/23/2022   CALCIUM 9.4 09/23/2022   PHOS 2.8 10/11/2009   PROT 7.1 09/23/2022   ALBUMIN 4.4 09/23/2022   LABGLOB 2.7 09/23/2022   AGRATIO 1.6 09/23/2022   BILITOT 0.4 09/23/2022   ALKPHOS 72 09/23/2022   AST 10 09/23/2022   ALT 8 09/23/2022   ANIONGAP 10 05/04/2020   Last lipids Lab Results  Component Value Date   CHOL 140 09/23/2022   HDL 33 (L) 09/23/2022   LDLCALC 90 09/23/2022   LDLDIRECT 133 (H) 07/14/2012   TRIG 91 09/23/2022   CHOLHDL 4.2 09/23/2022   Last hemoglobin A1c Lab Results  Component Value Date   HGBA1C 6.2 (A) 09/23/2022   Last thyroid functions Lab Results  Component Value Date   TSH 1.15 01/16/2017   Last vitamin D No results found for: "25OHVITD2", "25OHVITD3", "VD25OH" Last vitamin B12 and Folate No results found for: "VITAMINB12", "FOLATE"     The 10-year ASCVD risk score (Arnett DK, et al., 2019) is: 31.5%    Assessment & Plan:   Problem List Items Addressed This Visit       Cardiovascular and Mediastinum   HYPERTENSION, BENIGN ESSENTIAL   Relevant Medications   amLODipine (NORVASC) 10 MG tablet   isosorbide mononitrate (IMDUR) 30 MG 24 hr tablet     Endocrine   Diabetes mellitus type 2, controlled ([place]) - Primary   Relevant Medications   glimepiride (AMARYL) 4 MG tablet   Other Relevant Orders   Microalbumin/Creatinine Ratio, Urine (Completed)   POCT glycosylated hemoglobin (Hb A1C) (Completed)   Ambulatory referral to Ophthalmology   Lipid Panel (Completed)   Comprehensive metabolic panel (Completed)     Other   TOBACCO DEPENDENCE   HLD (hyperlipidemia)   Relevant Medications   amLODipine (NORVASC) 10 MG tablet   isosorbide mononitrate (IMDUR) 30 MG 24 hr tablet   Other Visit Diagnoses     Encounter for screening mammogram for malignant neoplasm of breast       Relevant Orders   MM Digital Screening   Screening for lung cancer       Relevant Orders   CT CHEST LUNG CA SCREEN LOW DOSE W/O CM      1. Controlled type 2 diabetes mellitus without complication, without long-term current  use of insulin (HCC)  - Microalbumin/Creatinine Ratio, Urine - POCT glycosylated hemoglobin (Hb A1C) - Ambulatory referral to Ophthalmology - Lipid Panel - Comprehensive metabolic panel - glimepiride (AMARYL) 4 MG tablet; TAKE 1/2 TABLET BY MOUTH DAILY WITH BREAKFAST  Dispense: 90 tablet; Refill: 1  2. Encounter for screening mammogram for malignant neoplasm of breast - MM Digital Screening; Future  3. Screening for lung cancer  - CT CHEST LUNG CA SCREEN LOW DOSE W/O CM; Future  4. HYPERTENSION, BENIGN ESSENTIAL  - amLODipine (NORVASC) 10 MG tablet; Take 1 tablet (10 mg total) by mouth daily.  Dispense: 30 tablet; Refill: 3 - isosorbide mononitrate (IMDUR) 30 MG 24 hr tablet; Take 1 tablet (30 mg total) by mouth  daily.  Dispense: 30 tablet; Refill: 3  5. Hyperlipidemia, unspecified hyperlipidemia type The 10-year ASCVD risk score (Arnett DK, et al., 2019) is: 31.5%   Values used to calculate the score:     Age: 30 years     Sex: Female     Is Non-Hispanic African American: Yes     Diabetic: Yes     Tobacco smoker: Yes     Systolic Blood Pressure: 125 mmHg     Is BP treated: Yes     HDL Cholesterol: 33 mg/dL     Total Cholesterol: 140 mg/dL   6. TOBACCO DEPENDENCE Smoking cessation instruction/counseling given:  counseled patient on the dangers of tobacco use, advised patient to stop smoking, and reviewed strategies to maximize success     Return in about 3 months (around 12/23/2022) for diabetes, hypertension, hyperlipidemia.   Nolon Nations  APRN, MSN, FNP-C Patient Care [facility] [address] [place], [place] [address] [phone]

## 2022-09-24 LAB — COMPREHENSIVE METABOLIC PANEL
ALT: 8 IU/L (ref 0–32)
AST: 10 IU/L (ref 0–40)
Albumin/Globulin Ratio: 1.6 (ref 1.2–2.2)
Albumin: 4.4 g/dL (ref 3.9–4.9)
Alkaline Phosphatase: 72 IU/L (ref 44–121)
BUN/Creatinine Ratio: 10 — ABNORMAL LOW (ref 12–28)
BUN: 12 mg/dL (ref 8–27)
Bilirubin Total: 0.4 mg/dL (ref 0.0–1.2)
CO2: 23 mmol/L (ref 20–29)
Calcium: 9.4 mg/dL (ref 8.7–10.3)
Chloride: 103 mmol/L (ref 96–106)
Creatinine, Ser: 1.2 mg/dL — ABNORMAL HIGH (ref 0.57–1.00)
Globulin, Total: 2.7 g/dL (ref 1.5–4.5)
Glucose: 106 mg/dL — ABNORMAL HIGH (ref 70–99)
Potassium: 4.4 mmol/L (ref 3.5–5.2)
Sodium: 143 mmol/L (ref 134–144)
Total Protein: 7.1 g/dL (ref 6.0–8.5)
eGFR: 49 mL/min/{1.73_m2} — ABNORMAL LOW (ref >59)

## 2022-09-24 LAB — MICROALBUMIN / CREATININE URINE RATIO
Creatinine, Urine: 153.9 mg/dL
Microalb/Creat Ratio: 11 mg/g creat (ref 0–29)
Microalbumin, Urine: 16.7 ug/mL

## 2022-09-24 LAB — LIPID PANEL
Chol/HDL Ratio: 4.2 ratio (ref 0.0–4.4)
Cholesterol, Total: 140 mg/dL (ref 100–199)
HDL: 33 mg/dL — ABNORMAL LOW (ref >39)
LDL Chol Calc (NIH): 90 mg/dL (ref 0–99)
Triglycerides: 91 mg/dL (ref 0–149)
VLDL Cholesterol Cal: 17 mg/dL (ref 5–40)

## 2022-10-09 ENCOUNTER — Other Ambulatory Visit: Payer: Self-pay

## 2022-10-09 MED ORDER — MELOXICAM 7.5 MG PO TABS: 7.5000 mg | ORAL_TABLET | ORAL | 1 refills | Status: DC | PRN

## 2022-10-09 NOTE — Telephone Encounter (Signed)
From: Hunt Oris To: Office of Cammie Sickle, [place] Sent: 10/08/2022 4:17 PM EST Subject: Medication Renewal Request  Refills have been requested for the following medications:   meloxicam (MOBIC) 7.5 MG tablet  Preferred pharmacy: [facility], [place] [address] Delivery method: [employer]

## 2022-10-30 ENCOUNTER — Telehealth: Payer: Self-pay

## 2022-10-30 ENCOUNTER — Ambulatory Visit: Payer: Medicare HMO

## 2022-10-30 NOTE — Telephone Encounter (Signed)
Unsuccessful attempt to reach patient on preferred number listed in notes for scheduled AWV. Left message on voicemail okay to reschedule. 

## 2022-11-17 ENCOUNTER — Other Ambulatory Visit: Payer: Self-pay | Admitting: Family Medicine

## 2022-11-17 DIAGNOSIS — E119 Type 2 diabetes mellitus without complications: Secondary | ICD-10-CM

## 2022-12-22 ENCOUNTER — Telehealth: Payer: Self-pay | Admitting: Family Medicine

## 2022-12-22 NOTE — Telephone Encounter (Signed)
Contacted Amber Suarez to schedule their annual wellness visit. Appointment made for 12/25/2022.  Thank you,  [facility] Support [facility] Direct dial  [phone]

## 2022-12-25 ENCOUNTER — Ambulatory Visit: Payer: Medicare HMO

## 2022-12-30 ENCOUNTER — Ambulatory Visit: Payer: Medicare HMO | Admitting: Family Medicine

## 2022-12-30 ENCOUNTER — Ambulatory Visit: Payer: Self-pay | Admitting: Family Medicine

## 2023-01-08 ENCOUNTER — Other Ambulatory Visit: Payer: Self-pay | Admitting: Family Medicine

## 2023-01-08 DIAGNOSIS — I10 Essential (primary) hypertension: Secondary | ICD-10-CM

## 2023-01-28 ENCOUNTER — Telehealth: Payer: Self-pay | Admitting: Family Medicine

## 2023-01-28 NOTE — Telephone Encounter (Signed)
Called patient to schedule Medicare Annual Wellness Visit (AWV). Left message for patient to call back and schedule Medicare Annual Wellness Visit (AWV).  Last date of AWV: due 08/15/2020 awvi per [facility]  Please schedule an appointment at any time with Abby, NHA. .  If any questions, please contact me at [phone].  Thank you,  Judeth Cornfield,  [facility] Clinical Support [facility] AWV Program Direct Dial ??[phone]

## 2023-02-05 ENCOUNTER — Ambulatory Visit (INDEPENDENT_AMBULATORY_CARE_PROVIDER_SITE_OTHER): Payer: Medicare HMO

## 2023-02-05 VITALS — BP 125/69 | Ht 69.0 in | Wt 147.0 lb

## 2023-02-05 DIAGNOSIS — Z0000 Encounter for general adult medical examination without abnormal findings: Secondary | ICD-10-CM | POA: Diagnosis not present

## 2023-02-05 DIAGNOSIS — Z780 Asymptomatic menopausal state: Secondary | ICD-10-CM

## 2023-02-05 DIAGNOSIS — Z1231 Encounter for screening mammogram for malignant neoplasm of breast: Secondary | ICD-10-CM

## 2023-02-05 DIAGNOSIS — F17210 Nicotine dependence, cigarettes, uncomplicated: Secondary | ICD-10-CM

## 2023-02-05 NOTE — Progress Notes (Signed)
I connected with  Amber Suarez on 02/05/23 by a audio enabled telemedicine application and verified that I am speaking with the correct person using two identifiers.  Patient Location: Home  Provider Location: Home Office  I discussed the limitations of evaluation and management by telemedicine. The patient expressed understanding and agreed to proceed.  Subjective:   Amber Suarez is a 67 y.o. female who presents for an Initial Medicare Annual Wellness Visit.  Review of Systems     Cardiac Risk Factors include: advanced age (>58men, >55 women);diabetes mellitus;dyslipidemia;hypertension;sedentary lifestyle     Objective:    Today's Vitals   02/05/23 1331  BP: 125/69  Weight: 147 lb (66.7 kg)  Height: 5\' 9"  (1.753 m)   Body mass index is 21.71 kg/m.     02/05/2023    1:41 PM 05/04/2020    1:13 PM 05/04/2017    9:51 AM 04/02/2017    9:00 AM 02/25/2017   10:59 AM 01/16/2017    9:21 AM 09/05/2016    8:26 AM  Advanced Directives  Does Patient Have a Medical Advance Directive? No No Yes Yes Yes No;Yes No  Type of Merchandiser, retail of [place];Living will Living will;Healthcare Power of Attorney   Copy of Healthcare Power of Attorney in Chart?     No - copy requested No - copy requested   Would patient like information on creating a medical advance directive? No - Patient declined          Current Medications (verified) Outpatient Encounter Medications as of 02/05/2023  Medication Sig   amLODipine (NORVASC) 10 MG tablet Take 1 tablet by mouth once daily   Blood Pressure Monitoring DEVI 1 each by Does not apply route daily.   glimepiride (AMARYL) 4 MG tablet TAKE 1/2 TABLET BY MOUTH DAILY WITH BREAKFAST   isosorbide mononitrate (IMDUR) 30 MG 24 hr tablet Take 1 tablet by mouth once daily   lisinopril (ZESTRIL) 10 MG tablet Take 1 tablet (10 mg total) by mouth daily.   meloxicam (MOBIC) 7.5 MG tablet Take 1 tablet (7.5 mg total) by mouth as needed  for pain.   nitroGLYCERIN (NITROSTAT) 0.4 MG SL tablet Place 0.4 mg under the tongue every 5 (five) minutes as needed. Call 911 if no improvement after 3rd tab   pravastatin (PRAVACHOL) 40 MG tablet TAKE 1 TABLET BY MOUTH DAILY   spironolactone (ALDACTONE) 100 MG tablet Take 1 tablet (100 mg total) by mouth 2 (two) times daily.   aspirin EC 81 MG tablet Take 1 tablet (81 mg total) by mouth daily. Swallow whole. (Patient not taking: Reported on 02/05/2023)   potassium chloride SA (K-DUR,KLOR-CON) 20 MEQ tablet Take 1 tablet (20 mEq total) by mouth 2 (two) times daily. (Patient not taking: Reported on 02/05/2023)   No facility-administered encounter medications on file as of 02/05/2023.    Allergies (verified) Morphine and codeine   History: Past Medical History:  Diagnosis Date   CHF (congestive heart failure) (HCC)    Diabetes mellitus    Heart murmur    Hyperlipidemia    Hypertension    Myocardial infarction [facility])    Stroke [facility])    Past Surgical History:  Procedure Laterality Date   ABDOMINAL HYSTERECTOMY     angoiplasty     LEFT HEART CATHETERIZATION WITH CORONARY ANGIOGRAM N/A 01/28/2012   Procedure: LEFT HEART CATHETERIZATION WITH CORONARY ANGIOGRAM;  Surgeon: Marykay Lex, MD;  Location: [facility] CATH LAB;  Service: Cardiovascular;  Laterality: N/A;   repair of aneursym  1997   Family History  Problem Relation Age of Onset   Diabetes Mother    Hypertension Mother    Hypertension Father    Hypertension Sister    Social History   Socioeconomic History   Marital status: Divorced    Spouse name: Not on file   Number of children: Not on file   Years of education: Not on file   Highest education level: Not on file  Occupational History   Not on file  Tobacco Use   Smoking status: Every Day    Packs/day: 0.50    Years: 40.00    Additional pack years: 0.00    Total pack years: 20.00    Types: Cigarettes   Smokeless tobacco: Never  Vaping Use   Vaping Use: Never used   Substance and Sexual Activity   Alcohol use: No   Drug use: No   Sexual activity: Not Currently    Birth control/protection: Surgical  Other Topics Concern   Not on file  Social History Narrative   Not on file   Social Determinants of Health   Financial Resource Strain: Low Risk  (02/05/2023)   Overall Financial Resource Strain (CARDIA)    Difficulty of Paying Living Expenses: Not hard at all  Food Insecurity: No Food Insecurity (02/05/2023)   Hunger Vital Sign    Worried About Running Out of Food in the Last Year: Never true    Ran Out of Food in the Last Year: Never true  Transportation Needs: No Transportation Needs (02/05/2023)   PRAPARE - Administrator, Civil Service (Medical): No    Lack of Transportation (Non-Medical): No  Physical Activity: Inactive (02/05/2023)   Exercise Vital Sign    Days of Exercise per Week: 0 days    Minutes of Exercise per Session: 0 min  Stress: No Stress Concern Present (02/05/2023)   [employer] of Occupational Health - Occupational Stress Questionnaire    Feeling of Stress : Not at all  Social Connections: Socially Integrated (02/05/2023)   Social Connection and Isolation Panel [NHANES]    Frequency of Communication with Friends and Family: More than three times a week    Frequency of Social Gatherings with Friends and Family: More than three times a week    Attends Religious Services: More than 4 times per year    Active Member of [employer] or Organizations: Yes    Attends Engineer, structural: More than 4 times per year    Marital Status: Married    Tobacco Counseling Ready to quit: Yes Counseling given: Yes   Clinical Intake:  Pre-visit preparation completed: Yes  Pain : No/denies pain     BMI - recorded: 21.71 Nutritional Status: BMI of 19-24  Normal Nutritional Risks: None Diabetes: Yes CBG done?: No (virtual visit) Did pt. bring in CBG monitor from home?: No  How often do you need to have someone  help you when you read instructions, pamphlets, or other written materials from your doctor or pharmacy?: 1 - Never  Nutrition Risk Assessment:  Has the patient had any N/V/D within the last 2 months?  No  Does the patient have any non-healing wounds?  No  Has the patient had any unintentional weight loss or weight gain?  No   Diabetes:  Is the patient diabetic?  Yes  If diabetic, was a CBG obtained today?  No  telephone Did the patient bring in their glucometer from  home?  No  How often do you monitor your CBG's? Patient does not monitor CBG's at home.   Financial Strains and Diabetes Management:  Are you having any financial strains with the device, your supplies or your medication? No .  Does the patient want to be seen by Chronic Care Management for management of their diabetes?  No  Would the patient like to be referred to a Nutritionist or for Diabetic Management?  No   Diabetic Exams:  Diabetic Eye Exam: Overdue for diabetic eye exam. Pt has been advised about the importance in completing this exam. Patient advised to call and schedule an eye exam. Diabetic Foot Exam: Completed 09/23/2022     Interpreter Needed?: No  Information entered by :: Abby Ossie Beltran, CMA   Activities of Daily Living    02/05/2023    1:43 PM  In your present state of health, do you have any difficulty performing the following activities:  Hearing? 0  Vision? 0  Difficulty concentrating or making decisions? 0  Walking or climbing stairs? 0  Dressing or bathing? 0  Doing errands, shopping? 0  Preparing Food and eating ? N  Using the Toilet? N  In the past six months, have you accidently leaked urine? N  Do you have problems with loss of bowel control? N  Managing your Medications? N  Managing your Finances? N  Housekeeping or managing your Housekeeping? N    Patient Care Team: Massie Maroon, FNP as PCP - General (Family Medicine) Nahser, Deloris Ping, MD as PCP - Cardiology  (Cardiology)  Indicate any recent Medical Services you may have received from other than [facility] providers in the past year (date may be approximate).     Assessment:   This is a routine wellness examination for Amber Suarez.  Hearing/Vision screen Hearing Screening - Comments:: Patient denies any hearing difficulties.   Vision Screening - Comments:: Patient states they wear reading glasses only.    Dietary issues and exercise activities discussed: Current Exercise Habits: The patient does not participate in regular exercise at present, Exercise limited by: None identified   Goals Addressed               This Visit's Progress     DIET - EAT MORE FRUITS AND VEGETABLES (pt-stated)        Patient's goal is to increase her fruits and vegetables.        Depression Screen    02/05/2023    1:39 PM 09/23/2022   10:21 AM 04/28/2022   10:16 AM 05/17/2020    1:37 PM 10/11/2019   10:01 AM 01/31/2019    8:21 AM 08/02/2018    8:22 AM  PHQ 2/9 Scores  PHQ - 2 Score 0 0 0 0 0 0 0  Exception Documentation    Medical reason       Fall Risk    02/05/2023    1:41 PM 04/28/2022   10:16 AM 10/29/2021   11:59 AM 01/29/2021    9:35 AM 10/30/2020    8:40 AM  Fall Risk   Falls in the past year? 0 0 0 0 0  Number falls in past yr: 0 0 0 0   Injury with Fall? 0 0  0   Risk for fall due to : No Fall Risks No Fall Risks     Follow up Falls prevention discussed Falls evaluation completed   Falls evaluation completed    FALL RISK PREVENTION PERTAINING TO THE HOME:  Any stairs  in or around the home? No  If so, are there any without handrails? No  Home free of loose throw rugs in walkways, pet beds, electrical cords, etc? Yes  Adequate lighting in your home to reduce risk of falls? Yes   ASSISTIVE DEVICES UTILIZED TO PREVENT FALLS:  Life alert? No  Use of a cane, walker or w/c? No  Grab bars in the bathroom? No  Shower chair or bench in shower? No  Elevated toilet seat or a handicapped toilet? No    TIMED UP AND GO:  Was the test performed? No . Telephone visit Cognitive Function:        02/05/2023    1:44 PM  6CIT Screen  What Year? 0 points  What month? 0 points  What time? 0 points  Count back from 20 0 points  Months in reverse 0 points  Repeat phrase 0 points  Total Score 0 points    Immunizations Immunization History  Administered Date(s) Administered   Influenza Split 06/26/2011, 07/14/2012   Influenza Whole 06/15/2008   PFIZER(Purple Top)SARS-COV-2 Vaccination 11/01/2019, 11/23/2019, 11/22/2020   Pneumococcal Conjugate-13 10/29/2021   Pneumococcal Polysaccharide-23 10/27/2013   Td 01/18/2004   Tdap 01/16/2017    TDAP status: Up to date  Flu Vaccine status: Up to date  Pneumococcal vaccine status: Due, Education has been provided regarding the importance of this vaccine. Advised may receive this vaccine at local pharmacy or Health Dept. Aware to provide a copy of the vaccination record if obtained from local pharmacy or Health Dept. Verbalized acceptance and understanding.  Covid-19 vaccine status: Information provided on how to obtain vaccines.   Qualifies for Shingles Vaccine? Yes   Zostavax completed No   Shingrix Completed?: No.    Education has been provided regarding the importance of this vaccine. Patient has been advised to call insurance company to determine out of pocket expense if they have not yet received this vaccine. Advised may also receive vaccine at local pharmacy or Health Dept. Verbalized acceptance and understanding.  Screening Tests Health Maintenance  Topic Date Due   Medicare Annual Wellness (AWV)  Never done   Lung Cancer Screening  Never done   Zoster Vaccines- Shingrix (1 of 2) Never done   OPHTHALMOLOGY EXAM  02/13/2021   COVID-19 Vaccine (4 - 2023-24 season) 05/16/2022   MAMMOGRAM  06/05/2022   Pneumonia Vaccine 29+ Years old (3 of 3 - PPSV23 or PCV20) 10/29/2022   Colonoscopy  04/29/2023 (Originally 08/19/1999)    HEMOGLOBIN A1C  03/24/2023   INFLUENZA VACCINE  04/16/2023   Diabetic kidney evaluation - eGFR measurement  09/24/2023   Diabetic kidney evaluation - Urine ACR  09/24/2023   FOOT EXAM  09/24/2023   DTaP/Tdap/Td (3 - Td or Tdap) 01/17/2027   DEXA SCAN  Completed   Hepatitis C Screening  Completed   HPV VACCINES  Aged Out    Health Maintenance  Health Maintenance Due  Topic Date Due   Medicare Annual Wellness (AWV)  Never done   Lung Cancer Screening  Never done   Zoster Vaccines- Shingrix (1 of 2) Never done   OPHTHALMOLOGY EXAM  02/13/2021   COVID-19 Vaccine (4 - 2023-24 season) 05/16/2022   MAMMOGRAM  06/05/2022   Pneumonia Vaccine 12+ Years old (3 of 3 - PPSV23 or PCV20) 10/29/2022    Colorectal Screening: Patient declined  Mammogram status: Ordered 02/05/2023. Pt provided with contact info and advised to call to schedule appt.   Bone Density status: Ordered 02/05/23. Pt provided  with contact info and advised to call to schedule appt.  Lung Cancer Screening: (Low Dose CT Chest recommended if Age 5-80 years, 30 pack-year currently smoking OR have quit w/in 15years.) does qualify.   Lung Cancer Screening Referral: Placed 02/05/23  Additional Screening:  Hepatitis C Screening: does qualify; Patient declined  Vision Screening: Recommended annual ophthalmology exams for early detection of glaucoma and other disorders of the eye. Is the patient up to date with their annual eye exam?  Yes  Who is the provider or what is the name of the office in which the patient attends annual eye exams? Dr.Hecker If pt is not established with a provider, would they like to be referred to a provider to establish care? No .   Dental Screening: Recommended annual dental exams for proper oral hygiene  Community Resource Referral / Chronic Care Management: CRR required this visit?  No   CCM required this visit?  No      Plan:     I have personally reviewed and noted the following in the  patient's chart:   Medical and social history Use of alcohol, tobacco or illicit drugs  Current medications and supplements including opioid prescriptions. Patient is not currently taking opioid prescriptions. Functional ability and status Nutritional status Physical activity Advanced directives List of other physicians Hospitalizations, surgeries, and ER visits in previous 12 months Vitals Screenings to include cognitive, depression, and falls Referrals and appointments  In addition, I have reviewed and discussed with patient certain preventive protocols, quality metrics, and best practice recommendations. A written personalized care plan for preventive services as well as general preventive health recommendations were provided to patient.   Due to this being a telephonic visit, the after visit summary with patients personalized plan was offered to patient via mail or my-chart. Patient would like to access their AVS via my-chart    Jordan [address], CMA   02/05/2023   Nurse Notes: Lung Ca, Mammogram, Dexa screening re-ordered today. Patient is due for eye exam

## 2023-02-05 NOTE — Addendum Note (Signed)
Addended by: Recardo Evangelist A on: 02/05/2023 02:02 PM   Modules accepted: Level of Service

## 2023-02-05 NOTE — Patient Instructions (Signed)
Ms. Gatewood , Thank you for taking time to come for your Medicare Wellness Visit. I appreciate your ongoing commitment to your health goals. Please review the following plan we discussed and let me know if I can assist you in the future.   These are the goals we discussed:  Goals       DIET - EAT MORE FRUITS AND VEGETABLES (pt-stated)      Patient's goal is to increase her fruits and vegetables.       Quit smoking / using tobacco        This is a list of the screening recommended for you and due dates:  Health Maintenance  Topic Date Due   Medicare Annual Wellness Visit  Never done   Screening for Lung Cancer  Never done   Zoster (Shingles) Vaccine (1 of 2) Never done   Eye exam for diabetics  02/13/2021   COVID-19 Vaccine (4 - 2023-24 season) 05/16/2022   Mammogram  06/05/2022   Pneumonia Vaccine (3 of 3 - PPSV23 or PCV20) 10/29/2022   Colon Cancer Screening  04/29/2023*   Hemoglobin A1C  03/24/2023   Flu Shot  04/16/2023   Yearly kidney function blood test for diabetes  09/24/2023   Yearly kidney health urinalysis for diabetes  09/24/2023   Complete foot exam   09/24/2023   DTaP/Tdap/Td vaccine (3 - Td or Tdap) 01/17/2027   DEXA scan (bone density measurement)  Completed   Hepatitis C Screening  Completed   HPV Vaccine  Aged Out  *Topic was postponed. The date shown is not the original due date.    Advanced directives: Advance directive discussed with you today. Even though you declined this today, please call our office should you change your mind, and we can give you the proper paperwork for you to fill out. Advance care planning is a way to make decisions about medical care that fits your values in case you are ever unable to make these decisions for yourself.   Conditions/risks identified: Aim for 30 minutes of exercise or brisk walking, 6-8 glasses of water, and 5 servings of fruits and vegetables each day.   Next appointment: Follow up in one year for your annual  wellness visit 02/11/2024 @ 1:30pm via telephone    Preventive Care 65 Years and Older, Female Preventive care refers to lifestyle choices and visits with your health care provider that can promote health and wellness. What does preventive care include? A yearly physical exam. This is also called an annual well check. Dental exams once or twice a year. Routine eye exams. Ask your health care provider how often you should have your eyes checked. Personal lifestyle choices, including: Daily care of your teeth and gums. Regular physical activity. Eating a healthy diet. Avoiding tobacco and drug use. Limiting alcohol use. Practicing safe sex. Taking low-dose aspirin every day. Taking vitamin and mineral supplements as recommended by your health care provider. What happens during an annual well check? The services and screenings done by your health care provider during your annual well check will depend on your age, overall health, lifestyle risk factors, and family history of disease. Counseling  Your health care provider may ask you questions about your: Alcohol use. Tobacco use. Drug use. Emotional well-being. Home and relationship well-being. Sexual activity. Eating habits. History of falls. Memory and ability to understand (cognition). Work and work Astronomer. Reproductive health. Screening  You may have the following tests or measurements: Height, weight, and BMI. Blood pressure. Lipid  and cholesterol levels. These may be checked every 5 years, or more frequently if you are over 69 years old. Skin check. Lung cancer screening. You may have this screening every year starting at age 69 if you have a 30-pack-year history of smoking and currently smoke or have quit within the past 15 years. Fecal occult blood test (FOBT) of the stool. You may have this test every year starting at age 69. Flexible sigmoidoscopy or colonoscopy. You may have a sigmoidoscopy every 5 years or a  colonoscopy every 10 years starting at age 69. Hepatitis C blood test. Hepatitis B blood test. Sexually transmitted disease (STD) testing. Diabetes screening. This is done by checking your blood sugar (glucose) after you have not eaten for a while (fasting). You may have this done every 1-3 years. Bone density scan. This is done to screen for osteoporosis. You may have this done starting at age 69. Mammogram. This may be done every 1-2 years. Talk to your health care provider about how often you should have regular mammograms. Talk with your health care provider about your test results, treatment options, and if necessary, the need for more tests. Vaccines  Your health care provider may recommend certain vaccines, such as: Influenza vaccine. This is recommended every year. Tetanus, diphtheria, and acellular pertussis (Tdap, Td) vaccine. You may need a Td booster every 10 years. Zoster vaccine. You may need this after age 69. Pneumococcal 13-valent conjugate (PCV13) vaccine. One dose is recommended after age 69. Pneumococcal polysaccharide (PPSV23) vaccine. One dose is recommended after age 69. Talk to your health care provider about which screenings and vaccines you need and how often you need them. This information is not intended to replace advice given to you by your health care provider. Make sure you discuss any questions you have with your health care provider. Document Released: 09/28/2015 Document Revised: 05/21/2016 Document Reviewed: 07/03/2015 Elsevier Interactive Patient Education  2017 [employer].  Fall Prevention in the Home Falls can cause injuries. They can happen to people of all ages. There are many things you can do to make your home safe and to help prevent falls. What can I do on the outside of my home? Regularly fix the edges of walkways and driveways and fix any cracks. Remove anything that might make you trip as you walk through a door, such as a raised step or  threshold. Trim any bushes or trees on the path to your home. Use bright outdoor lighting. Clear any walking paths of anything that might make someone trip, such as rocks or tools. Regularly check to see if handrails are loose or broken. Make sure that both sides of any steps have handrails. Any raised decks and porches should have guardrails on the edges. Have any leaves, snow, or ice cleared regularly. Use sand or salt on walking paths during winter. Clean up any spills in your garage right away. This includes oil or grease spills. What can I do in the bathroom? Use night lights. Install grab bars by the toilet and in the tub and shower. Do not use towel bars as grab bars. Use non-skid mats or decals in the tub or shower. If you need to sit down in the shower, use a plastic, non-slip stool. Keep the floor dry. Clean up any water that spills on the floor as soon as it happens. Remove soap buildup in the tub or shower regularly. Attach bath mats securely with double-sided non-slip rug tape. Do not have throw rugs and  other things on the floor that can make you trip. What can I do in the bedroom? Use night lights. Make sure that you have a light by your bed that is easy to reach. Do not use any sheets or blankets that are too big for your bed. They should not hang down onto the floor. Have a firm chair that has side arms. You can use this for support while you get dressed. Do not have throw rugs and other things on the floor that can make you trip. What can I do in the kitchen? Clean up any spills right away. Avoid walking on wet floors. Keep items that you use a lot in easy-to-reach places. If you need to reach something above you, use a strong step stool that has a grab bar. Keep electrical cords out of the way. Do not use floor polish or wax that makes floors slippery. If you must use wax, use non-skid floor wax. Do not have throw rugs and other things on the floor that can make you  trip. What can I do with my stairs? Do not leave any items on the stairs. Make sure that there are handrails on both sides of the stairs and use them. Fix handrails that are broken or loose. Make sure that handrails are as long as the stairways. Check any carpeting to make sure that it is firmly attached to the stairs. Fix any carpet that is loose or worn. Avoid having throw rugs at the top or bottom of the stairs. If you do have throw rugs, attach them to the floor with carpet tape. Make sure that you have a light switch at the top of the stairs and the bottom of the stairs. If you do not have them, ask someone to add them for you. What else can I do to help prevent falls? Wear shoes that: Do not have high heels. Have rubber bottoms. Are comfortable and fit you well. Are closed at the toe. Do not wear sandals. If you use a stepladder: Make sure that it is fully opened. Do not climb a closed stepladder. Make sure that both sides of the stepladder are locked into place. Ask someone to hold it for you, if possible. Clearly mark and make sure that you can see: Any grab bars or handrails. First and last steps. Where the edge of each step is. Use tools that help you move around (mobility aids) if they are needed. These include: Canes. Walkers. Scooters. Crutches. Turn on the lights when you go into a dark area. Replace any light bulbs as soon as they burn out. Set up your furniture so you have a clear path. Avoid moving your furniture around. If any of your floors are uneven, fix them. If there are any pets around you, be aware of where they are. Review your medicines with your doctor. Some medicines can make you feel dizzy. This can increase your chance of falling. Ask your doctor what other things that you can do to help prevent falls. This information is not intended to replace advice given to you by your health care provider. Make sure you discuss any questions you have with your  health care provider. Document Released: 06/28/2009 Document Revised: 02/07/2016 Document Reviewed: 10/06/2014 Elsevier Interactive Patient Education  2017 [employer].

## 2023-02-18 ENCOUNTER — Other Ambulatory Visit: Payer: Self-pay | Admitting: Family Medicine

## 2023-02-18 DIAGNOSIS — I10 Essential (primary) hypertension: Secondary | ICD-10-CM

## 2023-03-10 ENCOUNTER — Ambulatory Visit (INDEPENDENT_AMBULATORY_CARE_PROVIDER_SITE_OTHER): Payer: Medicare HMO | Admitting: Family Medicine

## 2023-03-10 ENCOUNTER — Other Ambulatory Visit: Payer: Self-pay

## 2023-03-10 ENCOUNTER — Other Ambulatory Visit: Payer: Self-pay | Admitting: Cardiovascular Disease

## 2023-03-10 VITALS — BP 152/67 | HR 55 | Temp 97.1°F | Wt 154.4 lb

## 2023-03-10 DIAGNOSIS — F17200 Nicotine dependence, unspecified, uncomplicated: Secondary | ICD-10-CM

## 2023-03-10 DIAGNOSIS — E119 Type 2 diabetes mellitus without complications: Secondary | ICD-10-CM

## 2023-03-10 DIAGNOSIS — E785 Hyperlipidemia, unspecified: Secondary | ICD-10-CM | POA: Diagnosis not present

## 2023-03-10 DIAGNOSIS — I10 Essential (primary) hypertension: Secondary | ICD-10-CM | POA: Diagnosis not present

## 2023-03-10 LAB — POCT GLYCOSYLATED HEMOGLOBIN (HGB A1C): Hemoglobin A1C: 6.6 % — AB (ref 4.0–5.6)

## 2023-03-10 MED ORDER — AMLODIPINE BESYLATE 10 MG PO TABS
10.0000 mg | ORAL_TABLET | Freq: Every day | ORAL | 0 refills | Status: DC
Start: 2023-03-10 — End: 2023-04-30

## 2023-03-10 MED ORDER — PRAVASTATIN SODIUM 40 MG PO TABS
40.0000 mg | ORAL_TABLET | Freq: Every day | ORAL | 0 refills | Status: DC
Start: 2023-03-10 — End: 2023-06-04

## 2023-03-10 NOTE — Progress Notes (Signed)
Established Patient Office Visit  Subjective   Patient ID: Amber Suarez, female    DOB: [birth year]  Age: 67 y.o. MRN: [id number]  Chief Complaint  Patient presents with   Diabetes    Follow up    Amber Suarez is a 67 year old female with a medical history significant for type 2 diabetes, hypertension, hyperlipidemia, and tobacco dependence that presents for a follow-up of her chronic conditions.  Amber Suarez has been feeling well and is without complaint on today.  Patient says that she has been taking all prescribed medications consistently.  She has not been following a low-fat, low carbohydrate diet divided over small meals. Amber Suarez does not check blood glucose or blood pressure at home. Today, patient denies any blurry vision, dizziness, polyuria, polydipsia, or polyphasia.  No chest pain, shortness of breath, or lower extremity swelling.  Patient has not made an attempt to quit smoking. Patient is up-to-date with eye exam.  Diabetes She presents for her follow-up diabetic visit. She has type 2 diabetes mellitus. Her disease course has been stable. Pertinent negatives for diabetes include no blurred vision, no chest pain, no fatigue, no foot paresthesias, no foot ulcerations, no polydipsia, no polyphagia, no polyuria, no visual change, no weakness and no weight loss. She does not see a podiatrist.Eye exam is current.    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   Other malaise and fatigue 10/27/2013   History of MI (myocardial infarction) 10/27/2013   Unstable angina (HCC) 01/28/2012   Insomnia 11/26/2010   Diabetes mellitus type 2, controlled (HCC) 05/11/2009   Overweight(278.02) 06/15/2008   HYPERTENSION, BENIGN ESSENTIAL 11/20/2006   HLD (hyperlipidemia) 11/12/2006   TOBACCO DEPENDENCE 11/12/2006   CVA 11/12/2006   Past Medical History:   Diagnosis Date   CHF (congestive heart failure) (HCC)    Diabetes mellitus    Heart murmur    Hyperlipidemia    Hypertension    Myocardial infarction (HCC)    Stroke [place])    Past Surgical History:  Procedure Laterality Date   ABDOMINAL HYSTERECTOMY     angoiplasty     LEFT HEART CATHETERIZATION WITH CORONARY ANGIOGRAM N/A 01/28/2012   Procedure: LEFT HEART CATHETERIZATION WITH CORONARY ANGIOGRAM;  Surgeon: Marykay Lex, MD;  Location[facility] CATH LAB;  Service: Cardiovascular;  Laterality: N/A;   repair of aneursym  1997   Social History   Tobacco Use   Smoking status: Every Day    Current packs/day: 0.50    Average packs/day: 0.5 packs/day for 40.0 years (20.0 ttl pk-yrs)    Types: Cigarettes   Smokeless tobacco: Never  Vaping Use   Vaping status: Never Used  Substance Use Topics   Alcohol use: No   Drug use: No   Social History   Socioeconomic History   Marital status: Divorced    Spouse name: Not on file   Number of children: Not on file   Years of education: Not on file   Highest education level: Not on file  Occupational History   Not on file  Tobacco Use   Smoking status: Every Day    Current packs/day: 0.50    Average packs/day: 0.5 packs/day for 40.0 years (20.0 ttl pk-yrs)    Types: Cigarettes   Smokeless tobacco: Never  Vaping Use   Vaping status: Never Used  Substance and Sexual Activity   Alcohol use:  No   Drug use: No   Sexual activity: Not Currently    Birth control/protection: Surgical  Other Topics Concern   Not on file  Social History Narrative   Not on file   Social Determinants of Health   Financial Resource Strain: Low Risk  (02/05/2023)   Overall Financial Resource Strain (CARDIA)    Difficulty of Paying Living Expenses: Not hard at all  Food Insecurity: No Food Insecurity (02/05/2023)   Hunger Vital Sign    Worried About Running Out of Food in the Last Year: Never true    Ran Out of Food in the Last Year: Never true   Transportation Needs: No Transportation Needs (02/05/2023)   PRAPARE - Administrator, Civil Service (Medical): No    Lack of Transportation (Non-Medical): No  Physical Activity: Inactive (02/05/2023)   Exercise Vital Sign    Days of Exercise per Week: 0 days    Minutes of Exercise per Session: 0 min  Stress: No Stress Concern Present (02/05/2023)   [employer] of Occupational Health - Occupational Stress Questionnaire    Feeling of Stress : Not at all  Social Connections: Socially Integrated (02/05/2023)   Social Connection and Isolation Panel [NHANES]    Frequency of Communication with Friends and Family: More than three times a week    Frequency of Social Gatherings with Friends and Family: More than three times a week    Attends Religious Services: More than 4 times per year    Active Member of [employer] or Organizations: Yes    Attends Engineer, structural: More than 4 times per year    Marital Status: Married  Catering manager Violence: Not At Risk (02/05/2023)   Humiliation, Afraid, Rape, and Kick questionnaire    Fear of Current or Ex-Partner: No    Emotionally Abused: No    Physically Abused: No    Sexually Abused: No   Family Status  Relation Name Status   Mother  Deceased   Father  Deceased   Sister  Alive  No partnership data on file   Family History  Problem Relation Age of Onset   Diabetes Mother    Hypertension Mother    Hypertension Father    Hypertension Sister    Allergies  Allergen Reactions   Morphine And Codeine Itching and Other (See Comments)    burning      Review of Systems  Constitutional: Negative.  Negative for chills, fatigue, fever and weight loss.  HENT: Negative.  Negative for hearing loss.   Eyes: Negative.  Negative for blurred vision.  Respiratory: Negative.    Cardiovascular: Negative.  Negative for chest pain.  Gastrointestinal: Negative.   Skin: Negative.   Neurological:  Negative for weakness.   Endo/Heme/Allergies:  Negative for polydipsia and polyphagia.      Objective:     BP (!) 152/67   Pulse (!) 55   Temp (!) 97.1 F (36.2 C)   Wt 154 lb 6.4 oz (70 kg)   SpO2 100%   BMI 22.80 kg/m  BP Readings from Last 3 Encounters:  03/17/23 136/78  03/10/23 (!) 152/67  02/05/23 125/69   Wt Readings from Last 3 Encounters:  03/17/23 155 lb 6.4 oz (70.5 kg)  03/10/23 154 lb 6.4 oz (70 kg)  02/05/23 147 lb (66.7 kg)      Physical Exam Constitutional:      Appearance: Normal appearance.  Eyes:     Pupils: Pupils are equal, round, and  reactive to light.  Cardiovascular:     Rate and Rhythm: Normal rate and regular rhythm.  Pulmonary:     Effort: Pulmonary effort is normal.  Abdominal:     General: Bowel sounds are normal.  Musculoskeletal:        General: Normal range of motion.  Skin:    General: Skin is warm.  Neurological:     General: No focal deficit present.     Mental Status: She is alert. Mental status is at baseline.  Psychiatric:        Mood and Affect: Mood normal.        Behavior: Behavior normal.        Thought Content: Thought content normal.        Judgment: Judgment normal.      Results for orders placed or performed in visit on 03/10/23  Comprehensive metabolic panel  Result Value Ref Range   Glucose 81 70 - 99 mg/dL   BUN 12 8 - 27 mg/dL   Creatinine, Ser 5.40 0.57 - 1.00 mg/dL   eGFR 76 >98 JX/BJY/7.82   BUN/Creatinine Ratio 14 12 - 28   Sodium 141 134 - 144 mmol/L   Potassium 4.4 3.5 - 5.2 mmol/L   Chloride 102 96 - 106 mmol/L   CO2 27 20 - 29 mmol/L   Calcium 9.6 8.7 - 10.3 mg/dL   Total Protein 7.0 6.0 - 8.5 g/dL   Albumin 4.2 3.9 - 4.9 g/dL   Globulin, Total 2.8 1.5 - 4.5 g/dL   Bilirubin Total 0.4 0.0 - 1.2 mg/dL   Alkaline Phosphatase 76 44 - 121 IU/L   AST 20 0 - 40 IU/L   ALT 17 0 - 32 IU/L  POCT glycosylated hemoglobin (Hb A1C)  Result Value Ref Range   Hemoglobin A1C 6.6 (A) 4.0 - 5.6 %   HbA1c POC (<> result,  manual entry)     HbA1c, POC (prediabetic range)     HbA1c, POC (controlled diabetic range)      Last CBC Lab Results  Component Value Date   WBC 5.5 05/17/2020   HGB 13.9 05/17/2020   HCT 41.3 05/17/2020   MCV 87 05/17/2020   MCH 29.2 05/17/2020   RDW 13.8 05/17/2020   PLT 158 05/17/2020   Last metabolic panel Lab Results  Component Value Date   GLUCOSE 81 03/10/2023   NA 141 03/10/2023   K 4.4 03/10/2023   CL 102 03/10/2023   CO2 27 03/10/2023   BUN 12 03/10/2023   CREATININE 0.84 03/10/2023   EGFR 76 03/10/2023   CALCIUM 9.6 03/10/2023   PHOS 2.8 10/11/2009   PROT 7.0 03/10/2023   ALBUMIN 4.2 03/10/2023   LABGLOB 2.8 03/10/2023   AGRATIO 1.6 09/23/2022   BILITOT 0.4 03/10/2023   ALKPHOS 76 03/10/2023   AST 20 03/10/2023   ALT 17 03/10/2023   ANIONGAP 10 05/04/2020   Last lipids Lab Results  Component Value Date   CHOL 140 09/23/2022   HDL 33 (L) 09/23/2022   LDLCALC 90 09/23/2022   LDLDIRECT 133 (H) 07/14/2012   TRIG 91 09/23/2022   CHOLHDL 4.2 09/23/2022   Last hemoglobin A1c Lab Results  Component Value Date   HGBA1C 6.6 (A) 03/10/2023   Last thyroid functions Lab Results  Component Value Date   TSH 1.15 01/16/2017   Last vitamin D No results found for: "25OHVITD2", "25OHVITD3", "VD25OH" Last vitamin B12 and Folate No results found for: "VITAMINB12", "FOLATE"    The ASCVD  Risk score (Arnett DK, et al., 2019) failed to calculate for the following reasons:   The patient has a prior MI or stroke diagnosis    Assessment & Plan:   Problem List Items Addressed This Visit       Cardiovascular and Mediastinum   HYPERTENSION, BENIGN ESSENTIAL   Relevant Medications   amLODipine (NORVASC) 10 MG tablet   pravastatin (PRAVACHOL) 40 MG tablet   Other Relevant Orders   Comprehensive metabolic panel (Completed)   Recheck vitals     Endocrine   Diabetes mellitus type 2, controlled (HCC)   Relevant Medications   pravastatin (PRAVACHOL) 40 MG  tablet   Other Relevant Orders   POCT glycosylated hemoglobin (Hb A1C) (Completed)   Comprehensive metabolic panel (Completed)     Other   HLD (hyperlipidemia)   Relevant Medications   amLODipine (NORVASC) 10 MG tablet   pravastatin (PRAVACHOL) 40 MG tablet   Other Relevant Orders   Lipid Panel   Other Visit Diagnoses     Tobacco dependence    -  Primary     1. Controlled type 2 diabetes mellitus without complication, without long-term current use of insulin (HCC) Hemoglobin A1c is 6.6, which is at goal.  No medication changes warranted on today. - POCT glycosylated hemoglobin (Hb A1C) - pravastatin (PRAVACHOL) 40 MG tablet; Take 1 tablet (40 mg total) by mouth daily.  Dispense: 90 tablet; Refill: 0 - Comprehensive metabolic panel  2. HYPERTENSION, BENIGN ESSENTIAL BP (!) 152/67   Pulse (!) 55   Temp (!) 97.1 F (36.2 C)   Wt 154 lb 6.4 oz (70 kg)   SpO2 100%   BMI 22.80 kg/m  Blood pressure above goal.  Discussed the importance of regular exercise and following a low-sodium diet.  Patient will continue amlodipine 10 mg daily. - amLODipine (NORVASC) 10 MG tablet; Take 1 tablet (10 mg total) by mouth daily.  Dispense: 30 tablet; Refill: 0 - Comprehensive metabolic panel - Recheck vitals  3. Hyperlipidemia, unspecified hyperlipidemia type  - Lipid Panel; Future  4. Tobacco dependence Smoking cessation instruction/counseling given:  counseled patient on the dangers of tobacco use, advised patient to stop smoking, and reviewed strategies to maximize success  Follow-up in 3 months for type 2 diabetes mellitus and hypertension.  Nolon Nations  APRN, MSN, FNP-C Patient Care [facility] [address] [place], [place] [address] [phone]

## 2023-03-10 NOTE — Patient Instructions (Addendum)
Hemoglobin A1c is 6.6, which is increased from previous. Will continue glimeperide at 4 mg daily. Recommend a carbohydrate modified diet divided over small meals.   - Continue medication, monitor blood pressure at home. Continue DASH diet.  Reminder to go to the ER if any CP, SOB, nausea, dizziness, severe HA, changes vision/speech, left arm numbness and tingling and jaw pain.   Return in 1 month for a bp check.

## 2023-03-11 LAB — COMPREHENSIVE METABOLIC PANEL
ALT: 17 IU/L (ref 0–32)
AST: 20 IU/L (ref 0–40)
Albumin: 4.2 g/dL (ref 3.9–4.9)
Alkaline Phosphatase: 76 IU/L (ref 44–121)
BUN/Creatinine Ratio: 14 (ref 12–28)
BUN: 12 mg/dL (ref 8–27)
Bilirubin Total: 0.4 mg/dL (ref 0.0–1.2)
CO2: 27 mmol/L (ref 20–29)
Calcium: 9.6 mg/dL (ref 8.7–10.3)
Chloride: 102 mmol/L (ref 96–106)
Creatinine, Ser: 0.84 mg/dL (ref 0.57–1.00)
Globulin, Total: 2.8 g/dL (ref 1.5–4.5)
Glucose: 81 mg/dL (ref 70–99)
Potassium: 4.4 mmol/L (ref 3.5–5.2)
Sodium: 141 mmol/L (ref 134–144)
Total Protein: 7 g/dL (ref 6.0–8.5)
eGFR: 76 mL/min/{1.73_m2} (ref >59)

## 2023-03-16 ENCOUNTER — Encounter: Payer: Self-pay | Admitting: Cardiovascular Disease

## 2023-03-16 NOTE — Progress Notes (Unsigned)
Cardiology Office Note   Date:  03/17/2023   ID:  Amber Suarez 03-01-1956, MRN [id number]  PCP:  Amber Maroon, FNP  Cardiologist:   Amber Miss, MD   Chief Complaint  Patient presents with   Hypertension        Problem list 1. Bradycardia 2. Essential hypertension 3. History of coronary artery disease 4. Hyperlipidemia 5. Diabetes mellitus 6. Cerebral aneurism with CVA - 1997.     February 19, 2017 Amber Suarez is a 67 y.o. female who is being seen today for the evaluation of bradycardia and HTN  at the request of Amber Maroon, FNP. Seen with daughter, Amber Suarez.   She has been seen by Dr. Herbie Suarez in the past. She previously saw Dr. Clarene Suarez. She had a cardiac catheterization in May, 2013 which revealed nonobstructive coronary artery disease with small vessel ectasia. 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  She has not had any recent chest pain  No syncope or presyncope  She was at her medical doctors office today for routine medical checkup. She was noted have a slow heart rate and hypertension.  BP  164/84 with HR of 54.  She received clonidine 0.2 mg orally at her medical doctors office and her blood pressure is much better.  HR has slowed further ( due to the clonidine )   February 19, 2017:  Amber Suarez is doing much better in the Aldactone  No CP or dyspnea   Feb. 25 ,2019: BP is up.    Feels fine.   Still eating some additional salt .  Does not exercise.  Walks on occasion   December 17, 2021: Amber Suarez is seen today for follow-up of her hypertension.  I last saw her in person in 2019.  We did a virtual visit in 2020. Still smoking , 1 ppd No CP or dyspnea  Has milld LV dysfunction by echo in 2018. EF  45-50% Walks some ,   Has modertae pulmonary HTN.    March 17, 2023 Amber Suarez is seen for follow up of her CHF, pulmonary hypertension, hyperlipidemia, hypertension, history of stroke. Still eats some salt ,  Still eats hot dogs on occasion ,  occasional  bologna  Still smoking  - advised her to stop  Lipids from Jan, 2024 were reviewed LDL is 90     Past Medical History:  Diagnosis Date   CHF (congestive heart failure) (HCC)    Diabetes mellitus    Heart murmur    Hyperlipidemia    Hypertension    Myocardial infarction [facility])    Stroke [facility])     Past Surgical History:  Procedure Laterality Date   ABDOMINAL HYSTERECTOMY     angoiplasty     LEFT HEART CATHETERIZATION WITH CORONARY ANGIOGRAM N/A 01/28/2012   Procedure: LEFT HEART CATHETERIZATION WITH CORONARY ANGIOGRAM;  Surgeon: Amber Lex, MD;  Location: [facility] CATH LAB;  Service: Cardiovascular;  Laterality: N/A;   repair of aneursym  1997     Current Outpatient Medications  Medication Sig Dispense Refill   amLODipine (NORVASC) 10 MG tablet Take 1 tablet (10 mg total) by mouth daily. 30 tablet 0   aspirin EC 81 MG tablet Take 1 tablet (81 mg total) by mouth daily. Swallow whole. 90 tablet 3   Blood Pressure Monitoring DEVI 1 each by Does not apply route daily. 1 Device 0   glimepiride (AMARYL) 4 MG tablet TAKE 1/2 TABLET BY MOUTH DAILY WITH BREAKFAST 90 tablet 1  isosorbide mononitrate (IMDUR) 30 MG 24 hr tablet Take 1 tablet by mouth once daily 30 tablet 0   lisinopril (ZESTRIL) 10 MG tablet Take 1 tablet by mouth once daily 90 tablet 0   meloxicam (MOBIC) 7.5 MG tablet Take 1 tablet (7.5 mg total) by mouth as needed for pain. 30 tablet 1   nitroGLYCERIN (NITROSTAT) 0.4 MG SL tablet Place 0.4 mg under the tongue every 5 (five) minutes as needed. Call 911 if no improvement after 3rd tab     potassium chloride SA (K-DUR,KLOR-CON) 20 MEQ tablet Take 1 tablet (20 mEq total) by mouth 2 (two) times daily. 60 tablet 3   pravastatin (PRAVACHOL) 40 MG tablet Take 1 tablet (40 mg total) by mouth daily. 90 tablet 0   spironolactone (ALDACTONE) 100 MG tablet TAKE 1 TABLET (100 MG TOTAL) BY MOUTH 2 (TWO) TIMES DAILY. 60 tablet 0   No current facility-administered medications for this  visit.       01/22/2017    1:28 PM  PAD Screen  Previous PAD dx? No  Previous surgical procedure? No  Pain with walking? No  Feet/toe relief with dangling? No  Painful, non-healing ulcers? No  Extremities discolored? No      Allergies:   Morphine and codeine    Social History:  The patient  reports that she has been smoking cigarettes. She has a 20.00 pack-year smoking history. She has never used smokeless tobacco. She reports that she does not drink alcohol and does not use drugs.   Family History:  The patient's family history includes Diabetes in her mother; Hypertension in her father, mother, and sister.    ROS: Noted in current history, all other systems are negative.   Physical Exam: Blood pressure 136/78, pulse 63, height 5\' 9"  (1.753 m), weight 155 lb 6.4 oz (70.5 kg), SpO2 98 %.       GEN:  Well nourished, well developed in no acute distress HEENT: Normal NECK: No JVD; No carotid bruits LYMPHATICS: No lymphadenopathy CARDIAC: RRR , soft systolic murmur  RESPIRATORY:  Clear to auscultation without rales, wheezing or rhonchi  ABDOMEN: Soft, non-tender, non-distended MUSCULOSKELETAL:  No edema; No deformity  SKIN: Warm and dry NEUROLOGIC:  Alert and oriented x 3   EKG:        Recent Labs: 03/10/2023: ALT 17; BUN 12; Creatinine, Ser 0.84; Potassium 4.4; Sodium 141    Lipid Panel    Component Value Date/Time   CHOL 140 09/23/2022 1052   TRIG 91 09/23/2022 1052   HDL 33 (L) 09/23/2022 1052   CHOLHDL 4.2 09/23/2022 1052   CHOLHDL 4.7 08/15/2016 0845   VLDL 19 08/15/2016 0845   LDLCALC 90 09/23/2022 1052   LDLDIRECT 133 (H) 07/14/2012 1402      Wt Readings from Last 3 Encounters:  03/17/23 155 lb 6.4 oz (70.5 kg)  03/10/23 154 lb 6.4 oz (70 kg)  02/05/23 147 lb (66.7 kg)      Other studies Reviewed: Additional studies/ records that were reviewed today include: . Review of the above records demonstrates:    ASSESSMENT AND PLAN:   1.  Hypertension:  BP is well controlled.   Cont current meds  2.  Hyperaldosteronism:  continue spironolactone    3. Sinus Bradycardia:      2. Coronary artery disease: She denies any angina.  I again advised her to stop smoking  She knows that she needs to quit    Current medicines are reviewed at length with the  patient today.  The patient does not have concerns regarding medicines.  Labs/ tests ordered today include:   Orders Placed This Encounter  Procedures   EKG 12-Lead    Disposition:  follow up in a year       Amber Miss, MD  03/17/2023 9:22 AM    [facility] [address] [place], [place], [place]  [address] Phone: [phone]; Fax: [phone]

## 2023-03-17 ENCOUNTER — Encounter: Payer: Self-pay | Admitting: Cardiovascular Disease

## 2023-03-17 ENCOUNTER — Telehealth: Payer: Self-pay

## 2023-03-17 ENCOUNTER — Ambulatory Visit: Payer: Medicare HMO | Attending: Cardiovascular Disease | Admitting: Cardiovascular Disease

## 2023-03-17 VITALS — BP 136/78 | HR 63 | Ht 69.0 in | Wt 155.4 lb

## 2023-03-17 DIAGNOSIS — E269 Hyperaldosteronism, unspecified: Secondary | ICD-10-CM | POA: Diagnosis not present

## 2023-03-17 DIAGNOSIS — I10 Essential (primary) hypertension: Secondary | ICD-10-CM | POA: Diagnosis not present

## 2023-03-17 DIAGNOSIS — I152 Hypertension secondary to endocrine disorders: Secondary | ICD-10-CM

## 2023-03-17 DIAGNOSIS — E785 Hyperlipidemia, unspecified: Secondary | ICD-10-CM

## 2023-03-17 MED ORDER — SPIRONOLACTONE 100 MG PO TABS: 100.0000 mg | ORAL_TABLET | Freq: Two times a day (BID) | ORAL | 3 refills | Status: DC

## 2023-03-17 NOTE — Patient Instructions (Signed)
Medication Instructions:   Your physician recommends that you continue on your current medications as directed. Please refer to the Current Medication list given to you today.  *If you need a refill on your cardiac medications before your next appointment, please call your pharmacy*    Follow-Up: At [facility], you and your health needs are our priority.  As part of our continuing mission to provide you with exceptional heart care, we have created designated Provider Care Teams.  These Care Teams include your primary Cardiologist (physician) and Advanced Practice Providers (APPs -  Physician Assistants and Nurse Practitioners) who all work together to provide you with the care you need, when you need it.  We recommend signing up for the patient portal called "MyChart".  Sign up information is provided on this After Visit Summary.  MyChart is used to connect with patients for Virtual Visits (Telemedicine).  Patients are able to view lab/test results, encounter notes, upcoming appointments, etc.  Non-urgent messages can be sent to your provider as well.   To learn more about what you can do with MyChart, go to [email or web address].    Your next appointment:   1 year(s)  Provider:   Philip Nahser, MD       

## 2023-03-17 NOTE — Telephone Encounter (Signed)
Pt's pharmacy is stating that there is a drug interaction between medications lisinopril and spironolactone. Interaction is Hyperkalemia, possibly with cardiac arrhythmias or arrest, may occur with the combination of potassium-sparing diuretice and ACE inhibitors. Serum potasium concentrations and renal function should be monitored. Patients with renal impairment, diabetes, older age, severe heart failure, and risk for dehydration may be at greater risk. Is Dr. Elease Hashimoto aware of this interaction and would he like for pt to continue taking these medications? Please address

## 2023-03-17 NOTE — Telephone Encounter (Signed)
Called pt's pharmacy to inform them that Dr. Elease Hashimoto is aware of the drug interaction and would like for the pharmacy to refill pt's spironolactone as prescribed. Pharmacy associate verbalized understanding.

## 2023-03-19 ENCOUNTER — Other Ambulatory Visit: Payer: Self-pay | Admitting: Family Medicine

## 2023-03-19 DIAGNOSIS — E119 Type 2 diabetes mellitus without complications: Secondary | ICD-10-CM

## 2023-03-23 ENCOUNTER — Other Ambulatory Visit: Payer: Self-pay | Admitting: Family Medicine

## 2023-03-23 DIAGNOSIS — I10 Essential (primary) hypertension: Secondary | ICD-10-CM

## 2023-03-24 ENCOUNTER — Other Ambulatory Visit: Payer: Self-pay | Admitting: Family Medicine

## 2023-03-24 ENCOUNTER — Ambulatory Visit: Payer: Self-pay

## 2023-03-24 DIAGNOSIS — I10 Essential (primary) hypertension: Secondary | ICD-10-CM

## 2023-03-30 ENCOUNTER — Encounter: Payer: Self-pay | Admitting: Family Medicine

## 2023-04-18 ENCOUNTER — Other Ambulatory Visit: Payer: Self-pay | Admitting: Family Medicine

## 2023-04-18 DIAGNOSIS — I10 Essential (primary) hypertension: Secondary | ICD-10-CM

## 2023-04-30 ENCOUNTER — Other Ambulatory Visit: Payer: Self-pay | Admitting: Family Medicine

## 2023-04-30 DIAGNOSIS — I10 Essential (primary) hypertension: Secondary | ICD-10-CM

## 2023-05-15 ENCOUNTER — Other Ambulatory Visit: Payer: Self-pay | Admitting: Family Medicine

## 2023-05-15 DIAGNOSIS — I10 Essential (primary) hypertension: Secondary | ICD-10-CM

## 2023-05-21 ENCOUNTER — Ambulatory Visit: Payer: Medicare HMO

## 2023-05-26 ENCOUNTER — Ambulatory Visit
Admission: RE | Admit: 2023-05-26 | Discharge: 2023-05-26 | Disposition: A | Discharge status: Home / Self Care | Payer: Medicare HMO | Source: Ambulatory Visit | Attending: Family Medicine | Admitting: Family Medicine

## 2023-05-26 DIAGNOSIS — Z1231 Encounter for screening mammogram for malignant neoplasm of breast: Secondary | ICD-10-CM | POA: Diagnosis not present

## 2023-06-04 ENCOUNTER — Other Ambulatory Visit: Payer: Self-pay | Admitting: Family Medicine

## 2023-06-04 DIAGNOSIS — E119 Type 2 diabetes mellitus without complications: Secondary | ICD-10-CM

## 2023-06-04 DIAGNOSIS — I10 Essential (primary) hypertension: Secondary | ICD-10-CM

## 2023-06-19 ENCOUNTER — Other Ambulatory Visit: Payer: Self-pay | Admitting: Family Medicine

## 2023-06-19 DIAGNOSIS — Z1211 Encounter for screening for malignant neoplasm of colon: Secondary | ICD-10-CM

## 2023-06-19 DIAGNOSIS — Z1212 Encounter for screening for malignant neoplasm of rectum: Secondary | ICD-10-CM

## 2023-08-04 ENCOUNTER — Telehealth: Payer: Self-pay

## 2023-08-04 NOTE — Patient Outreach (Signed)
Attempted to contact patient regarding DM eye, colorectal exam. Left voicemail for patient to return my call at [phone].  Nicholes Rough, CMA Care Guide [employer]

## 2023-08-12 ENCOUNTER — Other Ambulatory Visit: Payer: Self-pay | Admitting: Family Medicine

## 2023-08-12 DIAGNOSIS — I10 Essential (primary) hypertension: Secondary | ICD-10-CM

## 2023-08-17 ENCOUNTER — Other Ambulatory Visit: Payer: Self-pay | Admitting: Family Medicine

## 2023-08-17 DIAGNOSIS — I10 Essential (primary) hypertension: Secondary | ICD-10-CM

## 2023-08-29 ENCOUNTER — Other Ambulatory Visit: Payer: Self-pay | Admitting: Family Medicine

## 2023-08-29 DIAGNOSIS — E119 Type 2 diabetes mellitus without complications: Secondary | ICD-10-CM

## 2023-09-12 ENCOUNTER — Other Ambulatory Visit: Payer: Self-pay | Admitting: Family Medicine

## 2023-09-12 DIAGNOSIS — I10 Essential (primary) hypertension: Secondary | ICD-10-CM

## 2023-10-10 ENCOUNTER — Other Ambulatory Visit: Payer: Self-pay | Admitting: Internal Medicine

## 2023-10-10 DIAGNOSIS — I10 Essential (primary) hypertension: Secondary | ICD-10-CM

## 2023-10-13 ENCOUNTER — Other Ambulatory Visit: Payer: Self-pay | Admitting: Family Medicine

## 2023-10-13 DIAGNOSIS — I10 Essential (primary) hypertension: Secondary | ICD-10-CM

## 2023-10-23 ENCOUNTER — Other Ambulatory Visit: Payer: Self-pay

## 2023-10-23 DIAGNOSIS — I10 Essential (primary) hypertension: Secondary | ICD-10-CM

## 2023-10-23 MED ORDER — ISOSORBIDE MONONITRATE ER 30 MG PO TB24: 30.0000 mg | ORAL_TABLET | Freq: Every day | ORAL | 0 refills | Status: DC

## 2023-11-05 ENCOUNTER — Other Ambulatory Visit: Payer: Self-pay | Admitting: Family Medicine

## 2023-11-05 DIAGNOSIS — I10 Essential (primary) hypertension: Secondary | ICD-10-CM

## 2023-11-09 ENCOUNTER — Encounter: Payer: Self-pay | Admitting: Nurse Practitioner

## 2023-11-09 ENCOUNTER — Ambulatory Visit (INDEPENDENT_AMBULATORY_CARE_PROVIDER_SITE_OTHER): Payer: Medicare PPO | Admitting: Nurse Practitioner

## 2023-11-09 VITALS — BP 158/83 | HR 71 | Temp 97.3°F | Wt 150.0 lb

## 2023-11-09 DIAGNOSIS — Z122 Encounter for screening for malignant neoplasm of respiratory organs: Secondary | ICD-10-CM | POA: Diagnosis not present

## 2023-11-09 DIAGNOSIS — E1165 Type 2 diabetes mellitus with hyperglycemia: Secondary | ICD-10-CM

## 2023-11-09 DIAGNOSIS — F17210 Nicotine dependence, cigarettes, uncomplicated: Secondary | ICD-10-CM | POA: Diagnosis not present

## 2023-11-09 DIAGNOSIS — Z1211 Encounter for screening for malignant neoplasm of colon: Secondary | ICD-10-CM | POA: Diagnosis not present

## 2023-11-09 DIAGNOSIS — F17200 Nicotine dependence, unspecified, uncomplicated: Secondary | ICD-10-CM

## 2023-11-09 DIAGNOSIS — I10 Essential (primary) hypertension: Secondary | ICD-10-CM | POA: Diagnosis not present

## 2023-11-09 DIAGNOSIS — E119 Type 2 diabetes mellitus without complications: Secondary | ICD-10-CM

## 2023-11-09 DIAGNOSIS — Z8679 Personal history of other diseases of the circulatory system: Secondary | ICD-10-CM | POA: Diagnosis not present

## 2023-11-09 DIAGNOSIS — E785 Hyperlipidemia, unspecified: Secondary | ICD-10-CM

## 2023-11-09 DIAGNOSIS — I2510 Atherosclerotic heart disease of native coronary artery without angina pectoris: Secondary | ICD-10-CM | POA: Insufficient documentation

## 2023-11-09 LAB — POCT GLYCOSYLATED HEMOGLOBIN (HGB A1C): Hemoglobin A1C: 7.3 % — AB (ref 4.0–5.6)

## 2023-11-09 MED ORDER — NITROGLYCERIN 0.4 MG SL SUBL
0.4000 mg | SUBLINGUAL_TABLET | SUBLINGUAL | 0 refills | Status: DC | PRN
Start: 2023-11-09 — End: 2024-08-05

## 2023-11-09 MED ORDER — ISOSORBIDE MONONITRATE ER 30 MG PO TB24
30.0000 mg | ORAL_TABLET | Freq: Every day | ORAL | 1 refills | Status: DC
Start: 2023-11-09 — End: 2024-05-23

## 2023-11-09 MED ORDER — AMLODIPINE BESYLATE 10 MG PO TABS: 10.0000 mg | ORAL_TABLET | Freq: Every day | ORAL | 1 refills | Status: DC

## 2023-11-09 MED ORDER — GLIMEPIRIDE 4 MG PO TABS: 4.0000 mg | ORAL_TABLET | Freq: Every day | ORAL | 1 refills | Status: DC

## 2023-11-09 MED ORDER — LISINOPRIL 20 MG PO TABS: 20.0000 mg | ORAL_TABLET | Freq: Every day | ORAL | 3 refills | Status: DC

## 2023-11-09 NOTE — Patient Instructions (Addendum)
 Please consider getting Shingrix and pneumococcal vaccine at local pharmacy.   Goal for fasting blood sugar ranges from 80 to 120 and 2 hours after any meal or at bedtime should be between 130 to 170.   Around 3 times per week, check your blood pressure 2 times per day. once in the morning and once in the evening. The readings should be at least one minute apart. Write down these values and bring them to your next nurse visit/appointment.  When you check your BP, make sure you have been doing something calm/relaxing 5 minutes prior to checking. Both feet should be flat on the floor and you should be sitting. Use your left arm and make sure it is in a relaxed position (on a table), and that the cuff is at the approximate level/height of your heart.  Blood pressure goal is less than 130/80     1. Controlled type 2 diabetes mellitus without complication, without long-term current use of insulin (HCC) (Primary)  - POCT glycosylated hemoglobin (Hb A1C) - Microalbumin/Creatinine Ratio, Urine - LDL Cholesterol, Direct - Ambulatory referral to Ophthalmology - CMP14+EGFR - glimepiride (AMARYL) 4 MG tablet; Take 1 tablet (4 mg total) by mouth daily with breakfast.  Dispense: 90 tablet; Refill: 1  2. Screening for colon cancer  - Ambulatory referral to Gastroenterology  3. Screening for lung cancer  - CT CHEST LUNG CA SCREEN LOW DOSE W/O CM; Future  4. Tobacco dependence   It is important that you exercise regularly at least 30 minutes 5 times a week as tolerated  Think about what you will eat, plan ahead. Choose " clean, green, fresh or frozen" over canned, processed or packaged foods which are more sugary, salty and fatty. 70 to 75% of food eaten should be vegetables and fruit. Three meals at set times with snacks allowed between meals, but they must be fruit or vegetables. Aim to eat over a 12 hour period , example 7 am to 7 pm, and STOP after  your last meal of the day. Drink  water,generally about 64 ounces per day, no other drink is as healthy. Fruit juice is best enjoyed in a healthy way, by EATING the fruit.  Thanks for choosing [facility] we consider it a privelige to serve you.

## 2023-11-09 NOTE — Assessment & Plan Note (Signed)
 LDL goal is less than 70 Currently on pravastatin 40 mg daily Not fasting we will check direct LDL Avoid fatty fried foods

## 2023-11-09 NOTE — Assessment & Plan Note (Signed)
Smokes about 1 pack/day  Asked about quitting: confirms that he/she currently smokes cigarettes Advise to quit smoking: Educated about QUITTING to reduce the risk of cancer, cardio and cerebrovascular disease. Assess willingness: Unwilling to quit at this time, but is working on cutting back. Assist with counseling and pharmacotherapy: Counseled for 5 minutes and literature provided. Arrange for follow up: follow up in 1 month and continue to offer help.  

## 2023-11-09 NOTE — Assessment & Plan Note (Addendum)
 Patient currently denies chest pain Nitroglycerin 0.4 mg sublingual tablet refilled Continue isosorbide mononitrate 30 mg daily, pravastatin 40 mg daily, aspirin 81 mg daily Smoking cessation encouraged

## 2023-11-09 NOTE — Progress Notes (Signed)
 New Patient Office Visit  Subjective:  Patient ID: Amber Suarez, female    DOB: 26-Feb-1956  Age: 68 y.o. MRN: [id number]  CC:  Chief Complaint  Patient presents with   Hypertension    HPI Amber Suarez is a 68 y.o. female  has a past medical history of CHF (congestive heart failure) (HCC), Diabetes mellitus, Heart murmur, Hyperlipidemia, Hypertension, Myocardial infarction [facility]), and Stroke (HCC).  Patient presents to establish care for her chronic medical conditions.  Previous PCP Julianne Handler NP.  Patient is accompanied by her daughter  Type 2 diabetes.  Currently on glimepiride 2 mg daily.  Confirmed that her diet can be better as she snacks a lot .Patient denies polyphagia polyuria polydipsia.  Takes pravastatin 40 mg daily for hyperlipidemia  Hypertension.  Currently on lisinopril 10 mg daily, isosorbide mononitrate 30 mg daily, amlodipine 10 mg daily, spironolactone 100 mg twice daily.  Does not monitor blood pressure at home she denies chest pain, shortness of breath, edema  Nicotine dependence.  Smokes 1 pack of cigarettes daily.  Started smoking at age 73.  She denies cough, shortness of breath, wheezing.   Patient encouraged to get shingles vaccine and pneumococcal vaccine at the pharmacy.  Referral to GI for colon cancer screening   Past Medical History:  Diagnosis Date   CHF (congestive heart failure) (HCC)    Diabetes mellitus    Heart murmur    Hyperlipidemia    Hypertension    Myocardial infarction [facility])    Stroke [facility])     Past Surgical History:  Procedure Laterality Date   ABDOMINAL HYSTERECTOMY     angoiplasty     LEFT HEART CATHETERIZATION WITH CORONARY ANGIOGRAM N/A 01/28/2012   Procedure: LEFT HEART CATHETERIZATION WITH CORONARY ANGIOGRAM;  Surgeon: Marykay Lex, MD;  Location: [facility] CATH LAB;  Service: Cardiovascular;  Laterality: N/A;   repair of aneursym  1997    Family History  Problem Relation Age of Onset   Diabetes Mother     Hypertension Mother    Hypertension Father    Hypertension Sister    Breast cancer Neg Hx     Social History   Socioeconomic History   Marital status: Divorced    Spouse name: Not on file   Number of children: Not on file   Years of education: Not on file   Highest education level: Not on file  Occupational History   Not on file  Tobacco Use   Smoking status: Every Day    Current packs/day: 0.50    Average packs/day: 0.5 packs/day for 40.0 years (20.0 ttl pk-yrs)    Types: Cigarettes   Smokeless tobacco: Never  Vaping Use   Vaping status: Never Used  Substance and Sexual Activity   Alcohol use: No   Drug use: No   Sexual activity: Not Currently    Birth control/protection: Surgical  Other Topics Concern   Not on file  Social History Narrative   Lives with her boyfriend    Social Drivers of Health   Financial Resource Strain: Low Risk  (02/05/2023)   Overall Financial Resource Strain (CARDIA)    Difficulty of Paying Living Expenses: Not hard at all  Food Insecurity: No Food Insecurity (02/05/2023)   Hunger Vital Sign    Worried About Running Out of Food in the Last Year: Never true    Ran Out of Food in the Last Year: Never true  Transportation Needs: No Transportation Needs (02/05/2023)   PRAPARE -  Administrator, Civil Service (Medical): No    Lack of Transportation (Non-Medical): No  Physical Activity: Inactive (02/05/2023)   Exercise Vital Sign    Days of Exercise per Week: 0 days    Minutes of Exercise per Session: 0 min  Stress: No Stress Concern Present (02/05/2023)   [employer] of Occupational Health - Occupational Stress Questionnaire    Feeling of Stress : Not at all  Social Connections: Socially Integrated (02/05/2023)   Social Connection and Isolation Panel [NHANES]    Frequency of Communication with Friends and Family: More than three times a week    Frequency of Social Gatherings with Friends and Family: More than three times a week     Attends Religious Services: More than 4 times per year    Active Member of [employer] or Organizations: Yes    Attends Engineer, structural: More than 4 times per year    Marital Status: Married  Catering manager Violence: Not At Risk (02/05/2023)   Humiliation, Afraid, Rape, and Kick questionnaire    Fear of Current or Ex-Partner: No    Emotionally Abused: No    Physically Abused: No    Sexually Abused: No    ROS Review of Systems  Constitutional:  Negative for appetite change, chills, fatigue and fever.  HENT:  Negative for congestion, postnasal drip, rhinorrhea and sneezing.   Respiratory:  Negative for cough, shortness of breath and wheezing.   Cardiovascular:  Negative for chest pain, palpitations and leg swelling.  Gastrointestinal:  Negative for abdominal pain, constipation, nausea and vomiting.  Genitourinary:  Negative for difficulty urinating, dysuria, flank pain and frequency.  Musculoskeletal:  Negative for arthralgias, back pain, joint swelling and myalgias.  Skin:  Negative for color change, pallor, rash and wound.  Neurological:  Negative for dizziness, facial asymmetry, weakness, numbness and headaches.  Psychiatric/Behavioral:  Negative for behavioral problems, confusion, self-injury and suicidal ideas.     Objective:   Today's Vitals: BP (!) 158/83   Pulse 71   Temp (!) 97.3 F (36.3 C)   Wt 150 lb (68 kg)   SpO2 98%   BMI 22.15 kg/m   Physical Exam Vitals and nursing note reviewed.  Constitutional:      General: She is not in acute distress.    Appearance: Normal appearance. She is not ill-appearing, toxic-appearing or diaphoretic.  HENT:     Mouth/Throat:     Mouth: Mucous membranes are moist.     Pharynx: Oropharynx is clear. No oropharyngeal exudate or posterior oropharyngeal erythema.  Eyes:     General: No scleral icterus.       Right eye: No discharge.        Left eye: No discharge.     Extraocular Movements: Extraocular movements intact.      Conjunctiva/sclera: Conjunctivae normal.  Cardiovascular:     Rate and Rhythm: Normal rate and regular rhythm.     Pulses: Normal pulses.     Heart sounds: Normal heart sounds. No murmur heard.    No friction rub. No gallop.  Pulmonary:     Effort: Pulmonary effort is normal. No respiratory distress.     Breath sounds: Normal breath sounds. No stridor. No wheezing, rhonchi or rales.  Chest:     Chest wall: No tenderness.  Abdominal:     General: There is no distension.     Palpations: Abdomen is soft.     Tenderness: There is no abdominal tenderness. There is no right  CVA tenderness, left CVA tenderness or guarding.  Musculoskeletal:        General: No swelling, tenderness, deformity or signs of injury.     Right lower leg: No edema.     Left lower leg: No edema.  Skin:    General: Skin is warm and dry.     Capillary Refill: Capillary refill takes less than 2 seconds.     Coloration: Skin is not jaundiced or pale.     Findings: No bruising, erythema or lesion.  Neurological:     Mental Status: She is alert and oriented to person, place, and time.     Motor: No weakness.     Coordination: Coordination normal.     Gait: Gait normal.  Psychiatric:        Mood and Affect: Mood normal.        Behavior: Behavior normal.        Thought Content: Thought content normal.        Judgment: Judgment normal.     Assessment & Plan:   Problem List Items Addressed This Visit       Cardiovascular and Mediastinum   HYPERTENSION, BENIGN ESSENTIAL   BP Readings from Last 3 Encounters:  11/09/23 (!) 158/83  03/17/23 136/78  03/10/23 (!) 152/67  Increase lisinopril to 20 mg daily, continue amlodipine 10 mg daily isosorbide mononitrate 30 mg daily, spironolactone 100 mg twice daily Blood pressure goal is less than 130/80, patient encouraged to monitor blood pressure at home keep a log and bring to next visit in 4 weeks Discussed DASH diet and dietary sodium restrictions Continue to  increase dietary efforts and exercise.  CMP today        Relevant Medications   nitroGLYCERIN (NITROSTAT) 0.4 MG SL tablet   lisinopril (ZESTRIL) 20 MG tablet   amLODipine (NORVASC) 10 MG tablet   isosorbide mononitrate (IMDUR) 30 MG 24 hr tablet     Endocrine   Diabetes mellitus type 2, controlled (HCC) - Primary   Lab Results  Component Value Date   HGBA1C 7.3 (A) 11/09/2023  Increase Amaryl to 4 mg daily Patient counseled on low-carb diet Encouraged to engage in regular moderate exercises at least 150 minutes weekly as tolerated Diabetic foot exam completed, checking urine microalbumin labs CBG goals discussed patient encouraged to report hypoglycemia      Relevant Medications   lisinopril (ZESTRIL) 20 MG tablet   glimepiride (AMARYL) 4 MG tablet     Other   HLD (hyperlipidemia)   LDL goal is less than 70 Currently on pravastatin 40 mg daily Not fasting we will check direct LDL Avoid fatty fried foods      Relevant Medications   nitroGLYCERIN (NITROSTAT) 0.4 MG SL tablet   lisinopril (ZESTRIL) 20 MG tablet   amLODipine (NORVASC) 10 MG tablet   isosorbide mononitrate (IMDUR) 30 MG 24 hr tablet   TOBACCO DEPENDENCE   Smokes about 1 pack/day  Asked about quitting: confirms that he/she currently smokes cigarettes Advise to quit smoking: Educated about QUITTING to reduce the risk of cancer, cardio and cerebrovascular disease. Assess willingness: Unwilling to quit at this time, but is working on cutting back. Assist with counseling and pharmacotherapy: Counseled for 5 minutes and literature provided. Arrange for follow up: follow up in 1 month  and continue to offer help.        Screening for lung cancer   Smoking cessation encouraged Low-dose chest CT scan to screen for lung cancer ordered  Relevant Orders   CT CHEST LUNG CA SCREEN LOW DOSE W/O CM   History of CAD (coronary artery disease)   Patient currently denies chest pain Nitroglycerin 0.4 mg  sublingual tablet refilled Continue isosorbide mononitrate 30 mg daily, pravastatin 40 mg daily, aspirin 81 mg daily Smoking cessation encouraged      Other Visit Diagnoses       Screening for colon cancer       Relevant Orders   Ambulatory referral to Gastroenterology       Outpatient Encounter Medications as of 11/09/2023  Medication Sig   aspirin EC 81 MG tablet Take 1 tablet (81 mg total) by mouth daily. Swallow whole.   lisinopril (ZESTRIL) 20 MG tablet Take 1 tablet (20 mg total) by mouth daily.   meloxicam (MOBIC) 7.5 MG tablet Take 1 tablet (7.5 mg total) by mouth as needed for pain.   pravastatin (PRAVACHOL) 40 MG tablet Take 1 tablet by mouth once daily   spironolactone (ALDACTONE) 100 MG tablet Take 1 tablet (100 mg total) by mouth 2 (two) times daily.   [DISCONTINUED] amLODipine (NORVASC) 10 MG tablet Take 1 tablet by mouth once daily   [DISCONTINUED] glimepiride (AMARYL) 4 MG tablet TAKE 1/2 (ONE-HALF) TABLET BY MOUTH ONCE DAILY WITH BREAKFAST   [DISCONTINUED] isosorbide mononitrate (IMDUR) 30 MG 24 hr tablet Take 1 tablet (30 mg total) by mouth daily.   [DISCONTINUED] lisinopril (ZESTRIL) 10 MG tablet Take 1 tablet by mouth once daily   [DISCONTINUED] nitroGLYCERIN (NITROSTAT) 0.4 MG SL tablet Place 0.4 mg under the tongue every 5 (five) minutes as needed. Call 911 if no improvement after 3rd tab   amLODipine (NORVASC) 10 MG tablet Take 1 tablet (10 mg total) by mouth daily.   Blood Pressure Monitoring DEVI 1 each by Does not apply route daily.   glimepiride (AMARYL) 4 MG tablet Take 1 tablet (4 mg total) by mouth daily with breakfast.   isosorbide mononitrate (IMDUR) 30 MG 24 hr tablet Take 1 tablet (30 mg total) by mouth daily.   nitroGLYCERIN (NITROSTAT) 0.4 MG SL tablet Place 1 tablet (0.4 mg total) under the tongue every 5 (five) minutes as needed. Call 911 if no improvement after 3rd tab   [DISCONTINUED] potassium chloride SA (K-DUR,KLOR-CON) 20 MEQ tablet Take 1  tablet (20 mEq total) by mouth 2 (two) times daily. (Patient not taking: Reported on 11/09/2023)   No facility-administered encounter medications on file as of 11/09/2023.    Follow-up: Return in about 4 weeks (around 12/07/2023).   Donell Beers, FNP

## 2023-11-09 NOTE — Assessment & Plan Note (Addendum)
 BP Readings from Last 3 Encounters:  11/09/23 (!) 158/83  03/17/23 136/78  03/10/23 (!) 152/67  Increase lisinopril to 20 mg daily, continue amlodipine 10 mg daily isosorbide mononitrate 30 mg daily, spironolactone 100 mg twice daily Blood pressure goal is less than 130/80, patient encouraged to monitor blood pressure at home keep a log and bring to next visit in 4 weeks Discussed DASH diet and dietary sodium restrictions Continue to increase dietary efforts and exercise.  CMP today

## 2023-11-09 NOTE — Assessment & Plan Note (Signed)
 Smoking cessation encouraged Low-dose chest CT scan to screen for lung cancer ordered

## 2023-11-09 NOTE — Assessment & Plan Note (Signed)
 Lab Results  Component Value Date   HGBA1C 7.3 (A) 11/09/2023  Increase Amaryl to 4 mg daily Patient counseled on low-carb diet Encouraged to engage in regular moderate exercises at least 150 minutes weekly as tolerated Diabetic foot exam completed, checking urine microalbumin labs CBG goals discussed patient encouraged to report hypoglycemia

## 2023-11-10 ENCOUNTER — Other Ambulatory Visit: Payer: Self-pay | Admitting: Nurse Practitioner

## 2023-11-10 DIAGNOSIS — E876 Hypokalemia: Secondary | ICD-10-CM

## 2023-11-10 DIAGNOSIS — E785 Hyperlipidemia, unspecified: Secondary | ICD-10-CM

## 2023-11-10 DIAGNOSIS — N1831 Chronic kidney disease, stage 3a: Secondary | ICD-10-CM | POA: Insufficient documentation

## 2023-11-10 LAB — CMP14+EGFR
ALT: 7 [IU]/L (ref 0–32)
AST: 11 [IU]/L (ref 0–40)
Albumin: 4.4 g/dL (ref 3.9–4.9)
Alkaline Phosphatase: 88 [IU]/L (ref 44–121)
BUN/Creatinine Ratio: 6 — ABNORMAL LOW (ref 12–28)
BUN: 7 mg/dL — ABNORMAL LOW (ref 8–27)
Bilirubin Total: 0.3 mg/dL (ref 0.0–1.2)
CO2: 29 mmol/L (ref 20–29)
Calcium: 9.6 mg/dL (ref 8.7–10.3)
Chloride: 99 mmol/L (ref 96–106)
Creatinine, Ser: 1.15 mg/dL — ABNORMAL HIGH (ref 0.57–1.00)
Globulin, Total: 3.1 g/dL (ref 1.5–4.5)
Glucose: 120 mg/dL — ABNORMAL HIGH (ref 70–99)
Potassium: 3.3 mmol/L — ABNORMAL LOW (ref 3.5–5.2)
Sodium: 142 mmol/L (ref 134–144)
Total Protein: 7.5 g/dL (ref 6.0–8.5)
eGFR: 52 mL/min/{1.73_m2} — ABNORMAL LOW (ref >59)

## 2023-11-10 LAB — MICROALBUMIN / CREATININE URINE RATIO
Creatinine, Urine: 116.4 mg/dL
Microalb/Creat Ratio: 151 mg/g{creat} — ABNORMAL HIGH (ref 0–29)
Microalbumin, Urine: 175.5 ug/mL

## 2023-11-10 LAB — LDL CHOLESTEROL, DIRECT: LDL Direct: 87 mg/dL (ref 0–99)

## 2023-11-10 MED ORDER — PRAVASTATIN SODIUM 80 MG PO TABS
80.0000 mg | ORAL_TABLET | Freq: Every day | ORAL | 1 refills | Status: DC
Start: 2023-11-10 — End: 2024-06-02

## 2023-11-10 MED ORDER — POTASSIUM CHLORIDE CRYS ER 20 MEQ PO TBCR: 20.0000 meq | EXTENDED_RELEASE_TABLET | Freq: Every day | ORAL | 0 refills | Status: DC

## 2023-11-19 DIAGNOSIS — J189 Pneumonia, unspecified organism: Secondary | ICD-10-CM | POA: Diagnosis not present

## 2023-11-19 DIAGNOSIS — R059 Cough, unspecified: Secondary | ICD-10-CM | POA: Diagnosis not present

## 2023-11-30 ENCOUNTER — Ambulatory Visit: Payer: Self-pay | Admitting: Nurse Practitioner

## 2024-02-02 ENCOUNTER — Other Ambulatory Visit: Payer: Self-pay | Admitting: Nurse Practitioner

## 2024-02-02 DIAGNOSIS — I10 Essential (primary) hypertension: Secondary | ICD-10-CM

## 2024-02-11 ENCOUNTER — Ambulatory Visit: Payer: Self-pay

## 2024-02-11 VITALS — Ht 70.0 in | Wt 150.0 lb

## 2024-02-11 DIAGNOSIS — Z0000 Encounter for general adult medical examination without abnormal findings: Secondary | ICD-10-CM | POA: Diagnosis not present

## 2024-02-11 NOTE — Progress Notes (Signed)
 Subjective:   Amber Suarez is a 68 y.o. female who presents for Medicare Annual (Subsequent) preventive examination.  Visit Complete: Virtual I connected with  Amber Suarez on 02/11/24 by a audio enabled telemedicine application and verified that I am speaking with the correct person using two identifiers.  Patient Location: Home  Provider Location: Office/Clinic  I discussed the limitations of evaluation and management by telemedicine. The patient expressed understanding and agreed to proceed.  Vital Signs: Because this visit was a virtual/telehealth visit, some criteria may be missing or patient reported. Any vitals not documented were not able to be obtained and vitals that have been documented are patient reported.  Patient Medicare AWV questionnaire was completed by the patient on 02/11/24; I have confirmed that all information answered by patient is correct and no changes since this date.  Cardiac Risk Factors include: advanced age (>38men, >35 women);diabetes mellitus     Objective:     Today's Vitals   02/11/24 1218  Weight: 150 lb (68 kg)  Height: 5\' 10"  (1.778 m)   Body mass index is 21.52 kg/m.     02/11/2024   12:29 PM 02/05/2023    1:41 PM 05/04/2020    1:13 PM 05/04/2017    9:51 AM 04/02/2017    9:00 AM 02/25/2017   10:59 AM 01/16/2017    9:21 AM  Advanced Directives  Does Patient Have a Medical Advance Directive? No No No Yes Yes Yes No;Yes  Type of Dispensing optician of [place];Living will Living will;Healthcare Power of Attorney  Copy of Healthcare Power of Attorney in Chart?      No - copy requested No - copy requested  Would patient like information on creating a medical advance directive? No - Patient declined No - Patient declined         Current Medications (verified) Outpatient Encounter Medications as of 02/11/2024  Medication Sig   amLODipine  (NORVASC ) 10 MG tablet Take 1 tablet (10 mg total) by mouth daily.   aspirin   EC 81 MG tablet Take 1 tablet (81 mg total) by mouth daily. Swallow whole.   Blood Pressure Monitoring DEVI 1 each by Does not apply route daily.   glimepiride  (AMARYL ) 4 MG tablet Take 1 tablet (4 mg total) by mouth daily with breakfast.   isosorbide  mononitrate (IMDUR ) 30 MG 24 hr tablet Take 1 tablet (30 mg total) by mouth daily.   lisinopril  (ZESTRIL ) 10 MG tablet Take 1 tablet by mouth once daily   meloxicam  (MOBIC ) 7.5 MG tablet Take 1 tablet (7.5 mg total) by mouth as needed for pain.   potassium chloride  SA (KLOR-CON  M) 20 MEQ tablet Take 1 tablet (20 mEq total) by mouth daily.   pravastatin  (PRAVACHOL ) 80 MG tablet Take 1 tablet (80 mg total) by mouth daily.   spironolactone  (ALDACTONE ) 100 MG tablet Take 1 tablet (100 mg total) by mouth 2 (two) times daily.   lisinopril  (ZESTRIL ) 20 MG tablet Take 1 tablet (20 mg total) by mouth daily. (Patient not taking: Reported on 02/11/2024)   nitroGLYCERIN  (NITROSTAT ) 0.4 MG SL tablet Place 1 tablet (0.4 mg total) under the tongue every 5 (five) minutes as needed. Call 911 if no improvement after 3rd tab (Patient not taking: Reported on 02/11/2024)   No facility-administered encounter medications on file as of 02/11/2024.    Allergies (verified) Morphine and codeine   History: Past Medical History:  Diagnosis Date   CHF (congestive heart failure) (  HCC)    Diabetes mellitus    Heart murmur    Hyperlipidemia    Hypertension    Myocardial infarction [facility])    Stroke [facility])    Past Surgical History:  Procedure Laterality Date   ABDOMINAL HYSTERECTOMY     angoiplasty     LEFT HEART CATHETERIZATION WITH CORONARY ANGIOGRAM N/A 01/28/2012   Procedure: LEFT HEART CATHETERIZATION WITH CORONARY ANGIOGRAM;  Surgeon: Arleen Lacer, MD;  Location: [facility] CATH LAB;  Service: Cardiovascular;  Laterality: N/A;   repair of aneursym  1997   Family History  Problem Relation Age of Onset   Diabetes Mother    Hypertension Mother    Hypertension Father     Hypertension Sister    Breast cancer Neg Hx    Social History   Socioeconomic History   Marital status: Divorced    Spouse name: Not on file   Number of children: Not on file   Years of education: Not on file   Highest education level: Not on file  Occupational History   Not on file  Tobacco Use   Smoking status: Every Day    Current packs/day: 0.50    Average packs/day: 0.5 packs/day for 40.0 years (20.0 ttl pk-yrs)    Types: Cigarettes   Smokeless tobacco: Never  Vaping Use   Vaping status: Never Used  Substance and Sexual Activity   Alcohol use: No   Drug use: No   Sexual activity: Not Currently    Birth control/protection: Surgical  Other Topics Concern   Not on file  Social History Narrative   Lives with her boyfriend    Social Drivers of Health   Financial Resource Strain: Low Risk  (02/11/2024)   Overall Financial Resource Strain (CARDIA)    Difficulty of Paying Living Expenses: Not hard at all  Food Insecurity: Unknown (02/11/2024)   Hunger Vital Sign    Worried About Running Out of Food in the Last Year: Not on file    Ran Out of Food in the Last Year: Never true  Transportation Needs: No Transportation Needs (02/11/2024)   PRAPARE - Administrator, Civil Service (Medical): No    Lack of Transportation (Non-Medical): No  Physical Activity: Insufficiently Active (02/11/2024)   Exercise Vital Sign    Days of Exercise per Week: 3 days    Minutes of Exercise per Session: 30 min  Stress: No Stress Concern Present (02/11/2024)   [employer] of Occupational Health - Occupational Stress Questionnaire    Feeling of Stress : Not at all  Social Connections: Socially Integrated (02/11/2024)   Social Connection and Isolation Panel [NHANES]    Frequency of Communication with Friends and Family: More than three times a week    Frequency of Social Gatherings with Friends and Family: Once a week    Attends Religious Services: More than 4 times per year     Active Member of [employer] or Organizations: No    Attends Engineer, structural: More than 4 times per year    Marital Status: Married    Tobacco Counseling Ready to quit: Not Answered Counseling given: Not Answered   Clinical Intake:  Pre-visit preparation completed: Yes  Pain : No/denies pain     BMI - recorded: 21.52 Nutritional Status: BMI of 19-24  Normal Diabetes: Yes CBG done?: No Did pt. bring in CBG monitor from home?: No  How often do you need to have someone help you when you read instructions, pamphlets, or  other written materials from your doctor or pharmacy?: 1 - Never What is the last grade level you completed in school?: some college  Interpreter Needed?: No  Information entered by :: Julian Obey   Activities of Daily Living    02/11/2024   12:21 PM  In your present state of health, do you have any difficulty performing the following activities:  Hearing? 0  Vision? 0  Difficulty concentrating or making decisions? 0  Walking or climbing stairs? 0  Dressing or bathing? 0  Doing errands, shopping? 0  Preparing Food and eating ? N  Using the Toilet? N  In the past six months, have you accidently leaked urine? N  Do you have problems with loss of bowel control? N  Managing your Medications? N  Managing your Finances? N  Housekeeping or managing your Housekeeping? N    Patient Care Team: Paseda, Folashade R, FNP as PCP - General (Nurse Practitioner) Nahser, Lela Purple, MD as PCP - Cardiology (Cardiology)  Indicate any recent Medical Services you may have received from other than [facility] providers in the past year (date may be approximate).     Assessment:    This is a routine wellness examination for May.  Hearing/Vision screen No results found.   Goals Addressed             This Visit's Progress    Quit smoking / using tobacco   Not on track    Per pt cut back.       Depression Screen    02/11/2024   12:31 PM 02/11/2024    12:28 PM 11/09/2023    2:49 PM 03/10/2023    9:50 AM 02/05/2023    1:39 PM 09/23/2022   10:21 AM 04/28/2022   10:16 AM  PHQ 2/9 Scores  PHQ - 2 Score 0 0 0 0 0 0 0    Fall Risk    02/11/2024   12:29 PM 11/09/2023    2:49 PM 03/10/2023    9:50 AM 02/05/2023    1:41 PM 04/28/2022   10:16 AM  Fall Risk   Falls in the past year? 0 0 0 0 0  Number falls in past yr: 0 0 0 0 0  Injury with Fall? 0 0 0 0 0  Risk for fall due to : No Fall Risks No Fall Risks No Fall Risks No Fall Risks No Fall Risks  Follow up Falls evaluation completed Falls evaluation completed Falls evaluation completed Falls prevention discussed Falls evaluation completed    MEDICARE RISK AT HOME: Medicare Risk at Home Any stairs in or around the home?: No If so, are there any without handrails?: No Home free of loose throw rugs in walkways, pet beds, electrical cords, etc?: Yes Adequate lighting in your home to reduce risk of falls?: Yes Life alert?: No Use of a cane, walker or w/c?: No Grab bars in the bathroom?: No Shower chair or bench in shower?: Yes Elevated toilet seat or a handicapped toilet?: No  TIMED UP AND GO:  Was the test performed?  No    Cognitive Function:        02/11/2024   12:31 PM 11/09/2023    3:01 PM 02/05/2023    1:44 PM  6CIT Screen  What Year? 0 points 0 points 0 points  What month? 0 points 0 points 0 points  What time? 0 points 0 points 0 points  Count back from 20 0 points 0 points 0 points  Months in  reverse 0 points 2 points 0 points  Repeat phrase 2 points 0 points 0 points  Total Score 2 points 2 points 0 points    Immunizations Immunization History  Administered Date(s) Administered   Influenza Split 06/26/2011, 07/14/2012   Influenza Whole 06/15/2008   PFIZER(Purple Top)SARS-COV-2 Vaccination 11/01/2019, 11/23/2019, 11/22/2020   Pneumococcal Conjugate-13 10/29/2021   Pneumococcal Polysaccharide-23 10/27/2013   Td 01/18/2004   Tdap 01/16/2017    TDAP status: Up  to date  Flu Vaccine status: Up to date  Pneumococcal vaccine status: Due, Education has been provided regarding the importance of this vaccine. Advised may receive this vaccine at local pharmacy or Health Dept. Aware to provide a copy of the vaccination record if obtained from local pharmacy or Health Dept. Verbalized acceptance and understanding.  Covid-19 vaccine status: Declined, Education has been provided regarding the importance of this vaccine but patient still declined. Advised may receive this vaccine at local pharmacy or Health Dept.or vaccine clinic. Aware to provide a copy of the vaccination record if obtained from local pharmacy or Health Dept. Verbalized acceptance and understanding.  Qualifies for Shingles Vaccine? Yes   Zostavax completed No   Shingrix Completed?: No.    Education has been provided regarding the importance of this vaccine. Patient has been advised to call insurance company to determine out of pocket expense if they have not yet received this vaccine. Advised may also receive vaccine at local pharmacy or Health Dept. Verbalized acceptance and understanding.  Screening Tests Health Maintenance  Topic Date Due   Fecal DNA (Cologuard)  Never done   Lung Cancer Screening  Never done   Zoster Vaccines- Shingrix (1 of 2) Never done   OPHTHALMOLOGY EXAM  02/13/2021   Pneumonia Vaccine 88+ Years old (3 of 3 - PPSV23, PCV20 or PCV21) 12/24/2021   COVID-19 Vaccine (4 - 2024-25 season) 05/17/2023   INFLUENZA VACCINE  04/15/2024   HEMOGLOBIN A1C  05/08/2024   Diabetic kidney evaluation - eGFR measurement  11/08/2024   Diabetic kidney evaluation - Urine ACR  11/08/2024   FOOT EXAM  11/08/2024   Medicare Annual Wellness (AWV)  02/10/2025   MAMMOGRAM  05/25/2025   DTaP/Tdap/Td (3 - Td or Tdap) 01/17/2027   DEXA SCAN  Completed   Hepatitis C Screening  Completed   HPV VACCINES  Aged Out   Meningococcal B Vaccine  Aged Out    Health Maintenance  Health Maintenance  Due  Topic Date Due   Fecal DNA (Cologuard)  Never done   Lung Cancer Screening  Never done   Zoster Vaccines- Shingrix (1 of 2) Never done   OPHTHALMOLOGY EXAM  02/13/2021   Pneumonia Vaccine 78+ Years old (3 of 3 - PPSV23, PCV20 or PCV21) 12/24/2021   COVID-19 Vaccine (4 - 2024-25 season) 05/17/2023    Colorectal cancer screening: Type of screening: Cologuard. Completed N/A. Repeat every N/A years Will order.   Mammogram status: Completed 05/26/2023. Repeat every year   Lung Cancer Screening: (Low Dose CT Chest recommended if Age 81-80 years, 20 pack-year currently smoking OR have quit w/in 15years.) does qualify.   Lung Cancer Screening Referral: N/A will refer to provider  Additional Screening:  Hepatitis C Screening: does not qualify; Completed 08/06/2015  Vision Screening: Recommended annual ophthalmology exams for early detection of glaucoma and other disorders of the eye. Is the patient up to date with their annual eye exam?  Yes  Who is the provider or what is the name of the office in which  the patient attends annual eye exams? N/a If pt is not established with a provider, would they like to be referred to a provider to establish care? No .   Dental Screening: Recommended annual dental exams for proper oral hygiene  Diabetic Foot Exam: Diabetic Foot Exam: Completed 11/09/2023  Community Resource Referral / Chronic Care Management: CRR required this visit?  No   CCM required this visit?  No     Plan:     I have personally reviewed and noted the following in the patient's chart:   Medical and social history Use of alcohol, tobacco or illicit drugs  Current medications and supplements including opioid prescriptions. Patient is not currently taking opioid prescriptions. Functional ability and status Nutritional status Physical activity Advanced directives List of other physicians Hospitalizations, surgeries, and ER visits in previous 12  months Vitals Screenings to include cognitive, depression, and falls Referrals and appointments  In addition, I have reviewed and discussed with patient certain preventive protocols, quality metrics, and best practice recommendations. A written personalized care plan for preventive services as well as general preventive health recommendations were provided to patient.     Julian Obey, RMA   02/11/2024   After Visit Summary: (MyChart) Due to this being a telephonic visit, the after visit summary with patients personalized plan was offered to patient via MyChart   Nurse Notes: Thank you for your time.   Julian Obey   CMA II

## 2024-02-25 ENCOUNTER — Encounter: Payer: Self-pay | Admitting: Cardiovascular Disease

## 2024-03-06 ENCOUNTER — Other Ambulatory Visit: Payer: Self-pay | Admitting: Cardiovascular Disease

## 2024-05-02 ENCOUNTER — Other Ambulatory Visit: Payer: Self-pay | Admitting: Nurse Practitioner

## 2024-05-02 DIAGNOSIS — I10 Essential (primary) hypertension: Secondary | ICD-10-CM

## 2024-05-22 ENCOUNTER — Other Ambulatory Visit: Payer: Self-pay | Admitting: Nurse Practitioner

## 2024-05-22 DIAGNOSIS — I10 Essential (primary) hypertension: Secondary | ICD-10-CM

## 2024-06-02 ENCOUNTER — Other Ambulatory Visit: Payer: Self-pay | Admitting: Nurse Practitioner

## 2024-06-02 DIAGNOSIS — E1165 Type 2 diabetes mellitus with hyperglycemia: Secondary | ICD-10-CM

## 2024-06-02 DIAGNOSIS — E785 Hyperlipidemia, unspecified: Secondary | ICD-10-CM

## 2024-06-20 NOTE — Progress Notes (Signed)
 Amber Suarez                                          MRN: [id number]   06/20/2024   The VBCI Quality Team Specialist reviewed this patient medical record for the purposes of chart review for care gap closure. The following were reviewed: chart review for care gap closure-controlling blood pressure and diabetic eye exam.    VBCI Quality Team

## 2024-07-29 ENCOUNTER — Encounter: Payer: Self-pay | Admitting: Nurse Practitioner

## 2024-07-29 ENCOUNTER — Telehealth: Payer: Self-pay

## 2024-07-29 ENCOUNTER — Emergency Department (HOSPITAL_COMMUNITY)
Admission: EM | Admit: 2024-07-29 | Discharge: 2024-07-29 | Disposition: A | Discharge status: Home / Self Care | Source: Ambulatory Visit | Attending: Emergency Medicine | Admitting: Emergency Medicine

## 2024-07-29 ENCOUNTER — Ambulatory Visit (INDEPENDENT_AMBULATORY_CARE_PROVIDER_SITE_OTHER): Payer: Self-pay | Admitting: Nurse Practitioner

## 2024-07-29 ENCOUNTER — Ambulatory Visit: Payer: Self-pay

## 2024-07-29 VITALS — BP 200/106 | HR 62 | Wt 144.0 lb

## 2024-07-29 DIAGNOSIS — Z91148 Patient's other noncompliance with medication regimen for other reason: Secondary | ICD-10-CM | POA: Insufficient documentation

## 2024-07-29 DIAGNOSIS — Z7982 Long term (current) use of aspirin: Secondary | ICD-10-CM | POA: Insufficient documentation

## 2024-07-29 DIAGNOSIS — G4710 Hypersomnia, unspecified: Secondary | ICD-10-CM | POA: Diagnosis not present

## 2024-07-29 DIAGNOSIS — I10 Essential (primary) hypertension: Secondary | ICD-10-CM | POA: Insufficient documentation

## 2024-07-29 DIAGNOSIS — E785 Hyperlipidemia, unspecified: Secondary | ICD-10-CM | POA: Diagnosis not present

## 2024-07-29 DIAGNOSIS — I2510 Atherosclerotic heart disease of native coronary artery without angina pectoris: Secondary | ICD-10-CM

## 2024-07-29 MED ORDER — PRAVASTATIN SODIUM 80 MG PO TABS: 80.0000 mg | ORAL_TABLET | Freq: Every day | ORAL | 1 refills | Status: AC

## 2024-07-29 MED ORDER — SPIRONOLACTONE 100 MG PO TABS: 100.0000 mg | ORAL_TABLET | Freq: Two times a day (BID) | ORAL | 0 refills | Status: DC

## 2024-07-29 MED ORDER — ISOSORBIDE MONONITRATE ER 30 MG PO TB24: 30.0000 mg | ORAL_TABLET | Freq: Every day | ORAL | 1 refills | Status: AC

## 2024-07-29 MED ORDER — LISINOPRIL 20 MG PO TABS: 20.0000 mg | ORAL_TABLET | Freq: Every day | ORAL | 1 refills | Status: AC

## 2024-07-29 MED ORDER — SPIRONOLACTONE 100 MG PO TABS: 100.0000 mg | ORAL_TABLET | Freq: Two times a day (BID) | ORAL | 1 refills | Status: AC

## 2024-07-29 MED ORDER — ASPIRIN EC 81 MG PO TBEC: 81.0000 mg | DELAYED_RELEASE_TABLET | Freq: Every day | ORAL | 3 refills | Status: AC

## 2024-07-29 MED ORDER — AMLODIPINE BESYLATE 10 MG PO TABS: 10.0000 mg | ORAL_TABLET | Freq: Every day | ORAL | 1 refills | Status: AC

## 2024-07-29 NOTE — Patient Instructions (Signed)
Around 3 times per week, check your blood pressure 2 times per day. once in the morning and once in the evening. The readings should be at least one minute apart. Write down these values and bring them to your next nurse visit/appointment.  When you check your BP, make sure you have been doing something calm/relaxing 5 minutes prior to checking. Both feet should be flat on the floor and you should be sitting. Use your left arm and make sure it is in a relaxed position (on a table), and that the cuff is at the approximate level/height of your heart.    It is important that you exercise regularly at least 30 minutes 5 times a week as tolerated  Think about what you will eat, plan ahead. Choose " clean, green, fresh or frozen" over canned, processed or packaged foods which are more sugary, salty and fatty. 70 to 75% of food eaten should be vegetables and fruit. Three meals at set times with snacks allowed between meals, but they must be fruit or vegetables. Aim to eat over a 12 hour period , example 7 am to 7 pm, and STOP after  your last meal of the day. Drink water,generally about 64 ounces per day, no other drink is as healthy. Fruit juice is best enjoyed in a healthy way, by EATING the fruit.  Thanks for choosing [facility] we consider it a privelige to serve you.

## 2024-07-29 NOTE — ED Provider Notes (Signed)
 [place] EMERGENCY DEPARTMENT AT [facility] Provider Note   CSN: [id number] Arrival date & time: 07/29/24  1142     Patient presents with: Hypertension   Amber Suarez is a 68 y.o. female.   68 yo    Hypertension       Prior to Admission medications   Medication Sig Start Date End Date Taking? Authorizing Provider  amLODipine  (NORVASC ) 10 MG tablet Take 1 tablet (10 mg total) by mouth daily. 11/09/23   Paseda, Folashade R, FNP  aspirin  EC 81 MG tablet Take 1 tablet (81 mg total) by mouth daily. Swallow whole. 10/30/20   Jerrie Anger, PA  Blood Pressure Monitoring DEVI 1 each by Does not apply route daily. 02/25/17   Tilford Bertram CHRISTELLA, FNP  glimepiride  (AMARYL ) 4 MG tablet Take 1 tablet by mouth once daily with breakfast 06/02/24   Paseda, Folashade R, FNP  isosorbide  mononitrate (IMDUR ) 30 MG 24 hr tablet Take 1 tablet by mouth once daily 05/23/24   Paseda, Folashade R, FNP  lisinopril  (ZESTRIL ) 20 MG tablet Take 1 tablet (20 mg total) by mouth daily. Patient not taking: Reported on 07/29/2024 11/09/23   Paseda, Folashade R, FNP  meloxicam  (MOBIC ) 7.5 MG tablet Take 1 tablet (7.5 mg total) by mouth as needed for pain. 10/09/22   Tilford Bertram CHRISTELLA, FNP  nitroGLYCERIN  (NITROSTAT ) 0.4 MG SL tablet Place 1 tablet (0.4 mg total) under the tongue every 5 (five) minutes as needed. Call 911 if no improvement after 3rd tab 11/09/23   Paseda, Folashade R, FNP  potassium chloride  SA (KLOR-CON  M) 20 MEQ tablet Take 1 tablet (20 mEq total) by mouth daily. 11/10/23   Paseda, Folashade R, FNP  pravastatin  (PRAVACHOL ) 80 MG tablet Take 1 tablet by mouth once daily 06/02/24   Paseda, Folashade R, FNP  spironolactone  (ALDACTONE ) 100 MG tablet Take 1 tablet by mouth twice daily 03/09/24   Nahser, Aleene PARAS, MD    Allergies: Morphine and codeine    Review of Systems  Updated Vital Signs BP (!) 211/101 (BP Location: Left Arm)   Pulse (!) 54   Temp 98.3 F (36.8 C) (Oral)   Resp  18   SpO2 97%   Physical Exam  (all labs ordered are listed, but only abnormal results are displayed) Labs Reviewed - No data to display  EKG: None  Radiology: No results found.   Procedures   Medications Ordered in the ED - No data to display                                  Medical Decision Making  68 yo  F with chief complaints of high blood pressure.  She actually has no symptoms.  Saw her family doctor today and was sent here for evaluation.  I discussion with her and her daughter about following up with her doctor in clinic.  Patient asymptomatic with no noted s/s of end organ damage.  No chest pain, diaphoresis, nausea or other acs symptoms.  No headache or neurologic complaints,  no unequal pulses, normal pulse ox without rales or sob.  Feel this is unlikely to be a Hypertensive Emergency and recent studies suggest no benefit for inpatient admission.  There are also no studies to my knowledge suggesting that patients with hypertensive urgency have increased risk for end organ disease. In fact there has been a study recently that would suggest that the rapid  change can induce harm.  The [employer] of Emergency Physicians policy statement on asymptomatic hypertension does not  recommend routing ED medical intervention. The patient will follow up closely with their PCP.  Compliance with their medication stressed.   The management of elevated blood pressure in the acute care setting: a scientific statement from the American Heart Association Bress AP, Lenon GRIEVES, Flack JM, et al. Hypertension. May 2024. doi: 10.1161/HYP.0000000000000238  Tobie BOYERS, Neysa LITTIE Macadam EH, et al. Characteristics and outcomes of patients presenting with hypertensive urgency in the office setting. JAMA Intern Med. 2016 Jul 1; 176(7): 981-8.   Cerebrovascular risks with rapid blood pressure lowering in the absence of hypertensive emergency Cleotilde MALTESE, Lonna GORMAN Cloria KATHEE, et al. Am J Emerg Med.  2019;37(6):1073-1077.  1:31 PM:  I have discussed the diagnosis/risks/treatment options with the patient and family.  Evaluation and diagnostic testing in the emergency department does not suggest an emergent condition requiring admission or immediate intervention beyond what has been performed at this time.  They will follow up with PCP. We also discussed returning to the ED immediately if new or worsening sx occur. We discussed the sx which are most concerning (e.g., sudden worsening pain, fever, inability to tolerate by mouth, stroke s/sx, cp, abdominal pain) that necessitate immediate return. Medications administered to the patient during their visit and any new prescriptions provided to the patient are listed below.  Medications given during this visit Medications - No data to display   The patient appears reasonably screen and/or stabilized for discharge and I doubt any other medical condition or other [facility] requiring further screening, evaluation, or treatment in the ED at this time prior to discharge.       Final diagnoses:  Uncontrolled hypertension    ED Discharge Orders     None          Emil Share, DO 07/29/24 1331

## 2024-07-29 NOTE — Telephone Encounter (Signed)
 FYI Only or Action Required?: Action required by provider: update on patient condition. Please call regarding KCL  Patient was last seen in primary care on 07/29/2024 by Paseda, Folashade R, FNP.  Called Nurse Triage reporting Medication Problem.  Symptoms began today.  Interventions attempted: Nothing.  Symptoms are: stable.  Triage Disposition: Call PCP Now  Patient/caregiver understands and will follow disposition?: Yes  Copied from CRM #[id number]. Topic: Clinical - Medication Question >> Jul 29, 2024  3:11 PM Rosaria E wrote: Reason for CRM: Pt's daughter has questions about Potassium Chloride  Reason for Disposition  [1] Prescription not at pharmacy AND [2] was prescribed by doctor (or NP/PA) recently  (Exception: [facility] has access to EMR and prescription is recorded there. Go to Home Care and confirm for pharmacy.)  Answer Assessment - Initial Assessment Questions Pt sent to ED today for hypertensive urgency. Asymptomatic so was discharged home. They noticed some of her medications were renewed but the KCL was discontinued. The ED advised to FU.  Pharmacy confirmed. Please call daughter Bevelyn on [place] to discuss reasoning or if it is sent into the pharmacy. Patient remains asymptomatic   1. NAME of MEDICINE: What medicine(s) are you calling about?     Potassium Chloride  2. QUESTION: What is your question? (e.g., double dose of medicine, side effect)     Why was it discontinued 3. PRESCRIBER: Who prescribed the medicine? Reason: if prescribed by specialist, call should be referred to that group.     Paseda, NP 4. SYMPTOMS: Do you have any symptoms? If Yes, ask: What symptoms are you having?  How bad are the symptoms (e.g., mild, moderate, severe)     none  Protocols used: Medication Question Call-A-AH

## 2024-07-29 NOTE — ED Triage Notes (Signed)
 Sent over here by PCP for hypertension. Last BP in system is 201/98. Denies HA, CP. Takes amlodipine , lisinopril , spironolactone .

## 2024-07-29 NOTE — Telephone Encounter (Signed)
 Copied from CRM #[id number]. Topic: Appointments - Scheduling Inquiry for Clinic >> Jul 29, 2024  3:05 PM Amber Suarez wrote: Reason for CRM: Pt's daughter requesting to have the patient seen for an ED follow up. Nothing available soon enough.  Best contact: [phone]   Appt was made. KH

## 2024-07-29 NOTE — Progress Notes (Signed)
 Established Patient Office Visit  Subjective:  Patient ID: Amber Suarez, female    DOB: [birth year]  Age: 68 y.o. MRN: [id number]  CC:  Chief Complaint  Patient presents with   Annual Exam    Not fasting    HPI    Discussed the use of AI scribe software for clinical note transcription with the patient, who gave verbal consent to proceed.  History of Present Illness Amber Suarez is a 68 year old female  has a past medical history of CHF (congestive heart failure) (HCC), Diabetes mellitus, Heart murmur, Hyperlipidemia, Hypertension, Myocardial infarction [facility]), and Stroke (HCC).  who presents for follow-up for her chronic medical conditions . She is accompanied by her daughter  She has a history of hypertension and is experiencing issues with medication adherence. Amlodipine  10 mg daily was dispensed for 90 days in February. Lisinopril  was last dispensed in May for 90 days, with no refills until November. Spironolactone  100 mg twice daily was last dispensed in March 2025  She obtains her prescriptions from [facility] but did not bring her medication bottles to the appointment.  States that she takes her medications daily but her refill history suggests otherwise.  It appears that she has been taking glipizide and pravastatin  daily as ordered based on her refill history, However, other medications show gaps in adherence.  No chest pain, shortness of breath, or swelling.    Assessment & Plan       Past Medical History:  Diagnosis Date   CHF (congestive heart failure) (HCC)    Diabetes mellitus    Heart murmur    Hyperlipidemia    Hypertension    Myocardial infarction [facility])    Stroke [facility])     Past Surgical History:  Procedure Laterality Date   ABDOMINAL HYSTERECTOMY     angoiplasty     LEFT HEART CATHETERIZATION WITH CORONARY ANGIOGRAM N/A 01/28/2012   Procedure: LEFT HEART CATHETERIZATION WITH CORONARY ANGIOGRAM;  Surgeon: Alm LELON Clay, MD;  Location: [facility] CATH  LAB;  Service: Cardiovascular;  Laterality: N/A;   repair of aneursym  1997    Family History  Problem Relation Age of Onset   Diabetes Mother    Hypertension Mother    Hypertension Father    Hypertension Sister    Breast cancer Neg Hx     Social History   Socioeconomic History   Marital status: Divorced    Spouse name: Not on file   Number of children: Not on file   Years of education: Not on file   Highest education level: Not on file  Occupational History   Not on file  Tobacco Use   Smoking status: Every Day    Current packs/day: 0.50    Average packs/day: 0.5 packs/day for 40.0 years (20.0 ttl pk-yrs)    Types: Cigarettes   Smokeless tobacco: Never  Vaping Use   Vaping status: Never Used  Substance and Sexual Activity   Alcohol use: No   Drug use: No   Sexual activity: Not Currently    Birth control/protection: Surgical  Other Topics Concern   Not on file  Social History Narrative   Lives with her boyfriend    Social Drivers of Health   Financial Resource Strain: Low Risk  (02/11/2024)   Overall Financial Resource Strain (CARDIA)    Difficulty of Paying Living Expenses: Not hard at all  Food Insecurity: Unknown (02/11/2024)   Hunger Vital Sign    Worried About Radiation Protection Practitioner of Food  in the Last Year: Not on file    Ran Out of Food in the Last Year: Never true  Transportation Needs: No Transportation Needs (02/11/2024)   PRAPARE - Administrator, Civil Service (Medical): No    Lack of Transportation (Non-Medical): No  Physical Activity: Insufficiently Active (02/11/2024)   Exercise Vital Sign    Days of Exercise per Week: 3 days    Minutes of Exercise per Session: 30 min  Stress: No Stress Concern Present (02/11/2024)   [employer] of Occupational Health - Occupational Stress Questionnaire    Feeling of Stress : Not at all  Social Connections: Socially Integrated (02/11/2024)   Social Connection and Isolation Panel    Frequency of  Communication with Friends and Family: More than three times a week    Frequency of Social Gatherings with Friends and Family: Once a week    Attends Religious Services: More than 4 times per year    Active Member of [employer] or Organizations: No    Attends Engineer, Structural: More than 4 times per year    Marital Status: Married  Catering Manager Violence: Not At Risk (02/11/2024)   Humiliation, Afraid, Rape, and Kick questionnaire    Fear of Current or Ex-Partner: No    Emotionally Abused: No    Physically Abused: No    Sexually Abused: No    Outpatient Medications Prior to Visit  Medication Sig Dispense Refill   Blood Pressure Monitoring DEVI 1 each by Does not apply route daily. 1 Device 0   glimepiride  (AMARYL ) 4 MG tablet Take 1 tablet by mouth once daily with breakfast 90 tablet 0   meloxicam  (MOBIC ) 7.5 MG tablet Take 1 tablet (7.5 mg total) by mouth as needed for pain. 30 tablet 1   nitroGLYCERIN  (NITROSTAT ) 0.4 MG SL tablet Place 1 tablet (0.4 mg total) under the tongue every 5 (five) minutes as needed. Call 911 if no improvement after 3rd tab 30 tablet 0   amLODipine  (NORVASC ) 10 MG tablet Take 1 tablet (10 mg total) by mouth daily. 90 tablet 1   aspirin  EC 81 MG tablet Take 1 tablet (81 mg total) by mouth daily. Swallow whole. 90 tablet 3   isosorbide  mononitrate (IMDUR ) 30 MG 24 hr tablet Take 1 tablet by mouth once daily 90 tablet 0   lisinopril  (ZESTRIL ) 10 MG tablet Take 1 tablet by mouth once daily 90 tablet 0   potassium chloride  SA (KLOR-CON  M) 20 MEQ tablet Take 1 tablet (20 mEq total) by mouth daily. 2 tablet 0   pravastatin  (PRAVACHOL ) 80 MG tablet Take 1 tablet by mouth once daily 90 tablet 0   spironolactone  (ALDACTONE ) 100 MG tablet Take 1 tablet by mouth twice daily 60 tablet 0   lisinopril  (ZESTRIL ) 20 MG tablet Take 1 tablet (20 mg total) by mouth daily. (Patient not taking: Reported on 07/29/2024) 90 tablet 3   No facility-administered medications  prior to visit.    Allergies  Allergen Reactions   Morphine And Codeine Itching and Other (See Comments)    burning    ROS Review of Systems  Constitutional:  Negative for appetite change, chills, fatigue and fever.  HENT:  Negative for congestion, postnasal drip, rhinorrhea and sneezing.   Respiratory:  Negative for cough, shortness of breath and wheezing.   Cardiovascular:  Negative for chest pain, palpitations and leg swelling.  Gastrointestinal:  Negative for abdominal pain, constipation, nausea and vomiting.  Genitourinary:  Negative for difficulty urinating,  dysuria, flank pain and frequency.  Musculoskeletal:  Negative for arthralgias, back pain, joint swelling and myalgias.  Skin:  Negative for color change, pallor, rash and wound.  Neurological:  Negative for dizziness, facial asymmetry, weakness, numbness and headaches.  Psychiatric/Behavioral:  Negative for behavioral problems, confusion, self-injury and suicidal ideas.       Objective:    Physical Exam Vitals and nursing note reviewed.  Constitutional:      General: She is not in acute distress.    Appearance: Normal appearance. She is not ill-appearing, toxic-appearing or diaphoretic.  Eyes:     General: No scleral icterus.       Right eye: No discharge.        Left eye: No discharge.     Extraocular Movements: Extraocular movements intact.     Conjunctiva/sclera: Conjunctivae normal.  Cardiovascular:     Rate and Rhythm: Normal rate and regular rhythm.     Pulses: Normal pulses.     Heart sounds: Normal heart sounds. No murmur heard.    No friction rub. No gallop.  Pulmonary:     Effort: Pulmonary effort is normal. No respiratory distress.     Breath sounds: Normal breath sounds. No stridor. No wheezing, rhonchi or rales.  Chest:     Chest wall: No tenderness.  Abdominal:     General: There is no distension.     Palpations: Abdomen is soft.     Tenderness: There is no abdominal tenderness. There is no  right CVA tenderness, left CVA tenderness or guarding.  Musculoskeletal:        General: No swelling, tenderness, deformity or signs of injury.     Right lower leg: No edema.     Left lower leg: No edema.  Skin:    General: Skin is warm and dry.     Capillary Refill: Capillary refill takes less than 2 seconds.     Coloration: Skin is not jaundiced or pale.     Findings: No bruising, erythema or lesion.  Neurological:     Mental Status: She is alert and oriented to person, place, and time.     Motor: No weakness.     Gait: Gait normal.  Psychiatric:        Mood and Affect: Mood normal.        Behavior: Behavior normal.        Thought Content: Thought content normal.        Judgment: Judgment normal.     BP (!) 200/106   Pulse 62   Wt 144 lb (65.3 kg)   SpO2 99%   BMI 20.66 kg/m  Wt Readings from Last 3 Encounters:  07/29/24 144 lb (65.3 kg)  02/11/24 150 lb (68 kg)  11/09/23 150 lb (68 kg)    Lab Results  Component Value Date   TSH 1.15 01/16/2017   Lab Results  Component Value Date   WBC 5.5 05/17/2020   HGB 13.9 05/17/2020   HCT 41.3 05/17/2020   MCV 87 05/17/2020   PLT 158 05/17/2020   Lab Results  Component Value Date   NA 142 11/09/2023   K 3.3 (L) 11/09/2023   CO2 29 11/09/2023   GLUCOSE 120 (H) 11/09/2023   BUN 7 (L) 11/09/2023   CREATININE 1.15 (H) 11/09/2023   BILITOT 0.3 11/09/2023   ALKPHOS 88 11/09/2023   AST 11 11/09/2023   ALT 7 11/09/2023   PROT 7.5 11/09/2023   ALBUMIN 4.4 11/09/2023   CALCIUM  9.6 11/09/2023  ANIONGAP 10 05/04/2020   EGFR 52 (L) 11/09/2023   GFR 68.87 06/15/2017   Lab Results  Component Value Date   CHOL 140 09/23/2022   Lab Results  Component Value Date   HDL 33 (L) 09/23/2022   Lab Results  Component Value Date   LDLCALC 90 09/23/2022   Lab Results  Component Value Date   TRIG 91 09/23/2022   Lab Results  Component Value Date   CHOLHDL 4.2 09/23/2022   Lab Results  Component Value Date   HGBA1C  7.3 (A) 11/09/2023      Assessment & Plan:   Problem List Items Addressed This Visit       Cardiovascular and Mediastinum   CAD (coronary artery disease)   Relevant Medications   lisinopril  (ZESTRIL ) 20 MG tablet   isosorbide  mononitrate (IMDUR ) 30 MG 24 hr tablet   amLODipine  (NORVASC ) 10 MG tablet   aspirin  EC 81 MG tablet   pravastatin  (PRAVACHOL ) 80 MG tablet   spironolactone  (ALDACTONE ) 100 MG tablet     Other   HLD (hyperlipidemia)   Relevant Medications   lisinopril  (ZESTRIL ) 20 MG tablet   isosorbide  mononitrate (IMDUR ) 30 MG 24 hr tablet   amLODipine  (NORVASC ) 10 MG tablet   aspirin  EC 81 MG tablet   pravastatin  (PRAVACHOL ) 80 MG tablet   spironolactone  (ALDACTONE ) 100 MG tablet   Uncontrolled hypersomnia - Primary   Hypertensive crisis (urgency) Blood pressure at 200/106 mmHg indicates hypertensive crisis. Suspected non-adherence to antihypertensive medications based on refill history. - Referred to emergency room for further evaluation and management. - Scheduled follow-up appointment in 1 - Referred to clinical pharmacy for medication adherence support. I recommended the use of a pillbox to ensure adherence All blood pressure medications refilled      Relevant Medications   lisinopril  (ZESTRIL ) 20 MG tablet   isosorbide  mononitrate (IMDUR ) 30 MG 24 hr tablet   amLODipine  (NORVASC ) 10 MG tablet   spironolactone  (ALDACTONE ) 100 MG tablet   Other Relevant Orders   AMB Referral [facility] Care Management   Non compliance w medication regimen   Relevant Orders   AMB Referral [facility] Care Management    Meds ordered this encounter  Medications   DISCONTD: spironolactone  (ALDACTONE ) 100 MG tablet    Sig: Take 1 tablet (100 mg total) by mouth 2 (two) times daily.    Dispense:  60 tablet    Refill:  0    Pt needs appt for future refills 1st attempt thank you   lisinopril  (ZESTRIL ) 20 MG tablet    Sig: Take 1 tablet (20 mg total) by mouth daily.    Dispense:  90  tablet    Refill:  1   isosorbide  mononitrate (IMDUR ) 30 MG 24 hr tablet    Sig: Take 1 tablet (30 mg total) by mouth daily.    Dispense:  90 tablet    Refill:  1   amLODipine  (NORVASC ) 10 MG tablet    Sig: Take 1 tablet (10 mg total) by mouth daily.    Dispense:  90 tablet    Refill:  1   aspirin  EC 81 MG tablet    Sig: Take 1 tablet (81 mg total) by mouth daily. Swallow whole.    Dispense:  90 tablet    Refill:  3   pravastatin  (PRAVACHOL ) 80 MG tablet    Sig: Take 1 tablet (80 mg total) by mouth daily.    Dispense:  90 tablet    Refill:  1  spironolactone  (ALDACTONE ) 100 MG tablet    Sig: Take 1 tablet (100 mg total) by mouth 2 (two) times daily.    Dispense:  180 tablet    Refill:  1    Pt needs appt for future refills 1st attempt thank you    Follow-up: Return in about 1 week (around 08/05/2024).    Amori Cooperman R Romulo Okray, FNP

## 2024-07-29 NOTE — ED Notes (Signed)
 Registration at bedside.

## 2024-07-29 NOTE — Assessment & Plan Note (Signed)
 Hypertensive crisis (urgency) Blood pressure at 200/106 mmHg indicates hypertensive crisis. Suspected non-adherence to antihypertensive medications based on refill history. - Referred to emergency room for further evaluation and management. - Scheduled follow-up appointment in 1 - Referred to clinical pharmacy for medication adherence support. I recommended the use of a pillbox to ensure adherence All blood pressure medications refilled

## 2024-07-29 NOTE — Telephone Encounter (Signed)
 Pt seen and rescheduled . Amber Suarez

## 2024-07-29 NOTE — Discharge Instructions (Signed)
 Please return for headache, confusion, one-sided numbness or weakness or difficulty speech or swallowing.  Chest pain or difficulty breathing.  Follow up with your family doc.

## 2024-08-05 ENCOUNTER — Encounter: Payer: Self-pay | Admitting: Nurse Practitioner

## 2024-08-05 ENCOUNTER — Ambulatory Visit (INDEPENDENT_AMBULATORY_CARE_PROVIDER_SITE_OTHER): Payer: Self-pay | Admitting: Nurse Practitioner

## 2024-08-05 VITALS — BP 159/95 | HR 64 | Wt 143.0 lb

## 2024-08-05 DIAGNOSIS — I10 Essential (primary) hypertension: Secondary | ICD-10-CM | POA: Diagnosis not present

## 2024-08-05 DIAGNOSIS — E785 Hyperlipidemia, unspecified: Secondary | ICD-10-CM

## 2024-08-05 DIAGNOSIS — Z09 Encounter for follow-up examination after completed treatment for conditions other than malignant neoplasm: Secondary | ICD-10-CM | POA: Insufficient documentation

## 2024-08-05 DIAGNOSIS — I2510 Atherosclerotic heart disease of native coronary artery without angina pectoris: Secondary | ICD-10-CM | POA: Diagnosis not present

## 2024-08-05 DIAGNOSIS — R634 Abnormal weight loss: Secondary | ICD-10-CM | POA: Diagnosis not present

## 2024-08-05 DIAGNOSIS — N1831 Chronic kidney disease, stage 3a: Secondary | ICD-10-CM

## 2024-08-05 DIAGNOSIS — Z1211 Encounter for screening for malignant neoplasm of colon: Secondary | ICD-10-CM | POA: Diagnosis not present

## 2024-08-05 DIAGNOSIS — E1165 Type 2 diabetes mellitus with hyperglycemia: Secondary | ICD-10-CM

## 2024-08-05 DIAGNOSIS — F17200 Nicotine dependence, unspecified, uncomplicated: Secondary | ICD-10-CM

## 2024-08-05 DIAGNOSIS — G4700 Insomnia, unspecified: Secondary | ICD-10-CM | POA: Diagnosis not present

## 2024-08-05 DIAGNOSIS — Z8673 Personal history of transient ischemic attack (TIA), and cerebral infarction without residual deficits: Secondary | ICD-10-CM

## 2024-08-05 LAB — POCT GLYCOSYLATED HEMOGLOBIN (HGB A1C): Hemoglobin A1C: 7 % — AB (ref 4.0–5.6)

## 2024-08-05 MED ORDER — GLIMEPIRIDE 4 MG PO TABS: 4.0000 mg | ORAL_TABLET | Freq: Every day | ORAL | 0 refills | Status: AC

## 2024-08-05 MED ORDER — NITROGLYCERIN 0.4 MG SL SUBL: 0.4000 mg | SUBLINGUAL_TABLET | SUBLINGUAL | 0 refills | Status: AC | PRN

## 2024-08-05 NOTE — Assessment & Plan Note (Signed)
     08/05/2024    9:22 AM 08/05/2024    9:14 AM 07/29/2024    1:59 PM 07/29/2024   11:58 AM 07/29/2024   11:10 AM 07/29/2024   11:00 AM 07/29/2024   10:49 AM  BP/Weight  Systolic BP 159 160 169 211 200 201 202  Diastolic BP 95 99 98 101 106 98 109  Wt. (Lbs)  143     144  BMI  20.52 kg/m2     20.66 kg/m2  Blood pressure as improved but not at goal of less than 130/80, she has been taking her medications as ordered since her last visit a week ago Blood pressure remains elevated at home. Emphasized importance of control to prevent complications. - Continue amlodipine  10 mg daily, lisinopril , 20 mg daily spironolactone  100 mg twice daily.  Isosorbide  mononitrate 30 mg daily - Encouraged medication adherence. - Advised low salt, low fat diet and regular exercise. - Follow-up in 4 weeks to reassess blood pressure. Advised to follow-up with cardiology

## 2024-08-05 NOTE — Assessment & Plan Note (Signed)
 Wt Readings from Last 3 Encounters:  08/05/24 143 lb (64.9 kg)  07/29/24 144 lb (65.3 kg)  02/11/24 150 lb (68 kg)   Unintentional Weight Loss Reports weight loss and decreased appetite. - Encouraged consumption of healthy foods that she enjoys. Cologuard ordered to screen for colon cancer up-to-date with mammogram declined lung cancer screening

## 2024-08-05 NOTE — Assessment & Plan Note (Signed)
  A1c 7.0.  She confirmed that her diet can be better in terms of avoiding sweets, will focus on dietary and lifestyle change and recheck A1c in 3 months - Encouraged dietary modifications to avoid sugar, sweets, and soda. - Scheduled annual diabetic eye exam with Dr. Fredderick.

## 2024-08-05 NOTE — Assessment & Plan Note (Addendum)
 Reports limited sleep. Hesitant about sleep medications. - Recommended trial of over-the-counter melatonin for sleep.

## 2024-08-05 NOTE — Assessment & Plan Note (Signed)
 Lab Results  Component Value Date   NA 142 11/09/2023   K 3.3 (L) 11/09/2023   CO2 29 11/09/2023   GLUCOSE 120 (H) 11/09/2023   BUN 7 (L) 11/09/2023   CREATININE 1.15 (H) 11/09/2023   CALCIUM  9.6 11/09/2023   GFR 68.87 06/15/2017   EGFR 52 (L) 11/09/2023   GFRNONAA 46 (L) 10/30/2020  Rechecking labs Avoid NSAIDs and other nephrotoxic agents, on lisinopril  20 mg daily

## 2024-08-05 NOTE — Patient Instructions (Addendum)
 Please call cardiology office to schedule a follow up appointment  Aleene DOROTHA Passe, MD Address: [address], [place], [place] [address] Phone: [phone]   Around 3 times per week, check your blood pressure 2 times per day. once in the morning and once in the evening. The readings should be at least one minute apart. Write down these values and bring them to your next nurse visit/appointment.  When you check your BP, make sure you have been doing something calm/relaxing 5 minutes prior to checking. Both feet should be flat on the floor and you should be sitting. Use your left arm and make sure it is in a relaxed position (on a table), and that the cuff is at the approximate level/height of your heart.   1. Screening for colon cancer (Primary)  - Cologuard  2  Insomnia, unspecified type  Please  take melatonin 3mg  as needed at bedtime for sleep, please get medication over the counter  Please maintain simple sleep hygiene. - Maintain dark and non-noisy environment in the bedroom. - Please use the bedroom for sleep and sexual activity only. - Do not use electronic devices in the bedroom. - Please take dinner at least 2 hours before bedtime. - Please avoid caffeinated products in the evening, including coffee, soft drinks. - Please try to maintain the regular sleep-wake cycle - Go to bed and wake up at the same time.     It is important that you exercise regularly at least 30 minutes 5 times a week as tolerated  Think about what you will eat, plan ahead. Choose  clean, green, fresh or frozen over canned, processed or packaged foods which are more sugary, salty and fatty. 70 to 75% of food eaten should be vegetables and fruit. Three meals at set times with snacks allowed between meals, but they must be fruit or vegetables. Aim to eat over a 12 hour period , example 7 am to 7 pm, and STOP after  your last meal of the day. Drink water,generally about 64 ounces per day, no  other drink is as healthy. Fruit juice is best enjoyed in a healthy way, by EATING the fruit.  Thanks for choosing [facility] we consider it a privelige to serve you.

## 2024-08-05 NOTE — Assessment & Plan Note (Signed)
 Continue pravastatin  80 mg daily Checking direct LDL LDL goal is less than 55 Lab Results  Component Value Date   CHOL 140 09/23/2022   HDL 33 (L) 09/23/2022   LDLCALC 90 09/23/2022   LDLDIRECT 87 11/09/2023   TRIG 91 09/23/2022   CHOLHDL 4.2 09/23/2022

## 2024-08-05 NOTE — Assessment & Plan Note (Signed)
 Hospital chart reviewed, including discharge summary Medications reconciled and reviewed with the patient in detail

## 2024-08-05 NOTE — Addendum Note (Signed)
 Addended by: VICTORY IHA on: 08/05/2024 11:58 AM   Modules accepted: Orders

## 2024-08-05 NOTE — Assessment & Plan Note (Signed)
 On aspirin  81 mg daily, pravastatin  80 mg daily Need to get diabetes and hypertension under control discussed Smoking cessation discussed

## 2024-08-05 NOTE — Assessment & Plan Note (Addendum)
 Smokes about 1 pack/day  Asked about quitting: confirms that she currently smokes cigarettes Advise to quit smoking: Educated about QUITTING to reduce the risk of cancer, cardio and cerebrovascular disease. Assess willingness: willing to quit at this time, but is working on cutting back. Assist with counseling and pharmacotherapy: Counseled for 3 minutes and literature provided. Arrange for follow up: follow up in 1 month and continue to offer help.   - Delayed nicotine  patch initiation until blood pressure is better controlled. - Discussed potential for low dose chest CT scan for lung cancer screening but the patient declined

## 2024-08-05 NOTE — Progress Notes (Signed)
 Established Patient Office Visit  Subjective:  Patient ID: Amber Suarez, female    DOB: [birth year]  Age: 68 y.o. MRN: [id number]  CC:  Chief Complaint  Patient presents with  . Hospitalization Follow-up    HPI   Discussed the use of AI scribe software for clinical note transcription with the patient, who gave verbal consent to proceed.  History of Present Illness Amber Suarez is a 68 year old female   has a past medical history of CHF (congestive heart failure) (HCC), Diabetes mellitus, Heart murmur, Hyperlipidemia, Hypertension, Myocardial infarction [facility]), and Stroke (HCC).  who presents for blood pressure management.  She was seen about a week ago and since to the emergency room due to uncontrolled hypertension, since she was asymptomatic she was discharged home and instructed to follow-up closely with PCP and take her medications as ordered  Blood pressure readings at home have been in the 160s to 170s range. She is currently taking amlodipine  10 mg daily, lisinopril  20 mg daily, spironolactone  100 mg twice daily, and isosorbide  mononitrate 30 mg daily. . She checks her blood pressure at home once a day but did not bring the recorded values to the visit.  She has a history of coronary artery disease and has had a heart attack in the past. She is taking pravastatin  80 mg daily for cholesterol managementShe also takes a baby aspirin  daily. No recent chest pain and she does not currently have nitroglycerin .  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 She smokes one pack per day, having started as a teenager.  She denies wheezing, shortness of breath  She has diabetes and is taking glimepiride  4 mg with breakfast. Her appetite is poor, and she has been losing weight. She eats very little and prefers fish. Her caregiver notes that she has lost weight recently.  She reports difficulty sleeping, averaging four hours of sleep per night and taking cat naps. She has not tried any  medications for sleep due to concerns about interactions with her current medications. No snoring or breathing difficulties during sleep.  She is due for a diabetic eye exam and has an eye doctor, Dr. Cleatus, whom she sees for this purpose.     Assessment & Plan        Wt Readings from Last 3 Encounters:  08/05/24 143 lb (64.9 kg)  07/29/24 144 lb (65.3 kg)  02/11/24 150 lb (68 kg)     Past Medical History:  Diagnosis Date  . CHF (congestive heart failure) (HCC)   . Diabetes mellitus   . Heart murmur   . Hyperlipidemia   . Hypertension   . Myocardial infarction (HCC)   . Stroke [facility])     Past Surgical History:  Procedure Laterality Date  . ABDOMINAL HYSTERECTOMY    . angoiplasty    . LEFT HEART CATHETERIZATION WITH CORONARY ANGIOGRAM N/A 01/28/2012   Procedure: LEFT HEART CATHETERIZATION WITH CORONARY ANGIOGRAM;  Surgeon: Alm LELON Clay, MD;  Location: [facility] CATH LAB;  Service: Cardiovascular;  Laterality: N/A;  . repair of aneursym  1997    Family History  Problem Relation Age of Onset  . Diabetes Mother   . Hypertension Mother   . Hypertension Father   . Hypertension Sister   . Breast cancer Neg Hx     Social History   Socioeconomic History  . Marital status: Divorced    Spouse name: Not on file  . Number of children: Not on file  .  Years of education: Not on file  . Highest education level: Not on file  Occupational History  . Not on file  Tobacco Use  . Smoking status: Every Day    Current packs/day: 0.50    Average packs/day: 0.5 packs/day for 40.0 years (20.0 ttl pk-yrs)    Types: Cigarettes  . Smokeless tobacco: Never  Vaping Use  . Vaping status: Never Used  Substance and Sexual Activity  . Alcohol use: No  . Drug use: No  . Sexual activity: Not Currently    Birth control/protection: Surgical  Other Topics Concern  . Not on file  Social History Narrative   Lives with her boyfriend    Social Drivers of Health   Financial Resource  Strain: Low Risk  (02/11/2024)   Overall Financial Resource Strain (CARDIA)   . Difficulty of Paying Living Expenses: Not hard at all  Food Insecurity: Unknown (02/11/2024)   Hunger Vital Sign   . Worried About Programme Researcher, Broadcasting/film/video in the Last Year: Not on file   . Ran Out of Food in the Last Year: Never true  Transportation Needs: No Transportation Needs (02/11/2024)   PRAPARE - Transportation   . Lack of Transportation (Medical): No   . Lack of Transportation (Non-Medical): No  Physical Activity: Insufficiently Active (02/11/2024)   Exercise Vital Sign   . Days of Exercise per Week: 3 days   . Minutes of Exercise per Session: 30 min  Stress: No Stress Concern Present (02/11/2024)   [employer] of Occupational Health - Occupational Stress Questionnaire   . Feeling of Stress : Not at all  Social Connections: Socially Integrated (02/11/2024)   Social Connection and Isolation Panel   . Frequency of Communication with Friends and Family: More than three times a week   . Frequency of Social Gatherings with Friends and Family: Once a week   . Attends Religious Services: More than 4 times per year   . Active Member of Clubs or Organizations: No   . Attends Banker Meetings: More than 4 times per year   . Marital Status: Married  Catering Manager Violence: Not At Risk (02/11/2024)   Humiliation, Afraid, Rape, and Kick questionnaire   . Fear of Current or Ex-Partner: No   . Emotionally Abused: No   . Physically Abused: No   . Sexually Abused: No    Outpatient Medications Prior to Visit  Medication Sig Dispense Refill  . amLODipine  (NORVASC ) 10 MG tablet Take 1 tablet (10 mg total) by mouth daily. 90 tablet 1  . aspirin  EC 81 MG tablet Take 1 tablet (81 mg total) by mouth daily. Swallow whole. 90 tablet 3  . Blood Pressure Monitoring DEVI 1 each by Does not apply route daily. 1 Device 0  . isosorbide  mononitrate (IMDUR ) 30 MG 24 hr tablet Take 1 tablet (30 mg total) by mouth  daily. 90 tablet 1  . lisinopril  (ZESTRIL ) 20 MG tablet Take 1 tablet (20 mg total) by mouth daily. 90 tablet 1  . pravastatin  (PRAVACHOL ) 80 MG tablet Take 1 tablet (80 mg total) by mouth daily. 90 tablet 1  . spironolactone  (ALDACTONE ) 100 MG tablet Take 1 tablet (100 mg total) by mouth 2 (two) times daily. 180 tablet 1  . glimepiride  (AMARYL ) 4 MG tablet Take 1 tablet by mouth once daily with breakfast 90 tablet 0  . nitroGLYCERIN  (NITROSTAT ) 0.4 MG SL tablet Place 1 tablet (0.4 mg total) under the tongue every 5 (five) minutes as needed.  Call 911 if no improvement after 3rd tab 30 tablet 0  . meloxicam  (MOBIC ) 7.5 MG tablet Take 1 tablet (7.5 mg total) by mouth as needed for pain. (Patient not taking: Reported on 08/05/2024) 30 tablet 1   No facility-administered medications prior to visit.    Allergies  Allergen Reactions  . Morphine And Codeine Itching and Other (See Comments)    burning    ROS Review of Systems  Constitutional:  Negative for appetite change, chills, fatigue and fever.  HENT:  Negative for congestion, postnasal drip, rhinorrhea and sneezing.   Respiratory:  Negative for cough, shortness of breath and wheezing.   Cardiovascular:  Negative for chest pain, palpitations and leg swelling.  Gastrointestinal:  Negative for abdominal pain, constipation, nausea and vomiting.  Genitourinary:  Negative for difficulty urinating, dysuria, flank pain and frequency.  Musculoskeletal:  Negative for arthralgias, back pain, joint swelling and myalgias.  Skin:  Negative for color change, pallor, rash and wound.  Neurological:  Negative for dizziness, facial asymmetry, weakness, numbness and headaches.  Psychiatric/Behavioral:  Negative for behavioral problems, confusion, self-injury and suicidal ideas.       Objective:    Physical Exam Vitals and nursing note reviewed.  Constitutional:      General: She is not in acute distress.    Appearance: Normal appearance. She is not  ill-appearing, toxic-appearing or diaphoretic.  Eyes:     General: No scleral icterus.       Right eye: No discharge.        Left eye: No discharge.     Extraocular Movements: Extraocular movements intact.     Conjunctiva/sclera: Conjunctivae normal.  Cardiovascular:     Rate and Rhythm: Normal rate and regular rhythm.     Pulses: Normal pulses.     Heart sounds: Normal heart sounds. No murmur heard.    No friction rub. No gallop.  Pulmonary:     Effort: Pulmonary effort is normal. No respiratory distress.     Breath sounds: Normal breath sounds. No stridor. No wheezing, rhonchi or rales.  Chest:     Chest wall: No tenderness.  Abdominal:     General: There is no distension.     Palpations: Abdomen is soft.     Tenderness: There is no abdominal tenderness. There is no right CVA tenderness, left CVA tenderness or guarding.  Musculoskeletal:        General: No swelling, tenderness, deformity or signs of injury.     Right lower leg: No edema.     Left lower leg: No edema.  Skin:    General: Skin is warm and dry.     Capillary Refill: Capillary refill takes less than 2 seconds.     Coloration: Skin is not jaundiced or pale.     Findings: No bruising, erythema or lesion.  Neurological:     Mental Status: She is alert and oriented to person, place, and time.     Motor: No weakness.     Gait: Gait normal.  Psychiatric:        Mood and Affect: Mood normal.        Behavior: Behavior normal.        Thought Content: Thought content normal.        Judgment: Judgment normal.     BP (!) 159/95   Pulse 64   Wt 143 lb (64.9 kg)   SpO2 99%   BMI 20.52 kg/m  Wt Readings from Last 3 Encounters:  08/05/24 143  lb (64.9 kg)  07/29/24 144 lb (65.3 kg)  02/11/24 150 lb (68 kg)    Lab Results  Component Value Date   TSH 1.15 01/16/2017   Lab Results  Component Value Date   WBC 5.5 05/17/2020   HGB 13.9 05/17/2020   HCT 41.3 05/17/2020   MCV 87 05/17/2020   PLT 158 05/17/2020    Lab Results  Component Value Date   NA 142 11/09/2023   K 3.3 (L) 11/09/2023   CO2 29 11/09/2023   GLUCOSE 120 (H) 11/09/2023   BUN 7 (L) 11/09/2023   CREATININE 1.15 (H) 11/09/2023   BILITOT 0.3 11/09/2023   ALKPHOS 88 11/09/2023   AST 11 11/09/2023   ALT 7 11/09/2023   PROT 7.5 11/09/2023   ALBUMIN 4.4 11/09/2023   CALCIUM  9.6 11/09/2023   ANIONGAP 10 05/04/2020   EGFR 52 (L) 11/09/2023   GFR 68.87 06/15/2017   Lab Results  Component Value Date   CHOL 140 09/23/2022   Lab Results  Component Value Date   HDL 33 (L) 09/23/2022   Lab Results  Component Value Date   LDLCALC 90 09/23/2022   Lab Results  Component Value Date   TRIG 91 09/23/2022   Lab Results  Component Value Date   CHOLHDL 4.2 09/23/2022   Lab Results  Component Value Date   HGBA1C 7.3 (A) 11/09/2023      Assessment & Plan:   Problem List Items Addressed This Visit       Cardiovascular and Mediastinum   HYPERTENSION, BENIGN ESSENTIAL       08/05/2024    9:22 AM 08/05/2024    9:14 AM 07/29/2024    1:59 PM 07/29/2024   11:58 AM 07/29/2024   11:10 AM 07/29/2024   11:00 AM 07/29/2024   10:49 AM  BP/Weight  Systolic BP 159 160 169 211 200 201 202  Diastolic BP 95 99 98 101 106 98 109  Wt. (Lbs)  143     144  BMI  20.52 kg/m2     20.66 kg/m2  Blood pressure as improved but not at goal of less than 130/80, she has been taking her medications as ordered since her last visit a week ago Blood pressure remains elevated at home. Emphasized importance of control to prevent complications. - Continue amlodipine  10 mg daily, lisinopril , 20 mg daily spironolactone  100 mg twice daily.  Isosorbide  mononitrate 30 mg daily - Encouraged medication adherence. - Advised low salt, low fat diet and regular exercise. - Follow-up in 4 weeks to reassess blood pressure. Advised to follow-up with cardiology       Relevant Medications   nitroGLYCERIN  (NITROSTAT ) 0.4 MG SL tablet   Other Relevant Orders    Direct LDL   CBC   CMP14+EGFR   CAD (coronary artery disease)   Coronary Artery Disease No recent chest pain. Emphasized regular cardiologist follow-up. - Refilled nitroglycerin  prescription. Continue aspirin  81 mg daily, pravastatin  80 mg daily, checking direct LDL - Encouraged cardiologist follow-up.       Relevant Medications   nitroGLYCERIN  (NITROSTAT ) 0.4 MG SL tablet     Endocrine   Uncontrolled type 2 diabetes mellitus with hyperglycemia (HCC)    A1c 7.0.  She confirmed that her diet can be better in terms of avoiding sweets, will focus on dietary and lifestyle change and recheck A1c in 3 months - Encouraged dietary modifications to avoid sugar, sweets, and soda. - Scheduled annual diabetic eye exam with Dr. Fredderick.  Relevant Medications   glimepiride  (AMARYL ) 4 MG tablet     Genitourinary   CKD stage 3a, GFR 45-59 ml/min (HCC)   Lab Results  Component Value Date   NA 142 11/09/2023   K 3.3 (L) 11/09/2023   CO2 29 11/09/2023   GLUCOSE 120 (H) 11/09/2023   BUN 7 (L) 11/09/2023   CREATININE 1.15 (H) 11/09/2023   CALCIUM  9.6 11/09/2023   GFR 68.87 06/15/2017   EGFR 52 (L) 11/09/2023   GFRNONAA 46 (L) 10/30/2020  Rechecking labs Avoid NSAIDs and other nephrotoxic agents, on lisinopril  20 mg daily        Other   HLD (hyperlipidemia)   Continue pravastatin  80 mg daily Checking direct LDL LDL goal is less than 55 Lab Results  Component Value Date   CHOL 140 09/23/2022   HDL 33 (L) 09/23/2022   LDLCALC 90 09/23/2022   LDLDIRECT 87 11/09/2023   TRIG 91 09/23/2022   CHOLHDL 4.2 09/23/2022         Relevant Medications   nitroGLYCERIN  (NITROSTAT ) 0.4 MG SL tablet   TOBACCO DEPENDENCE   Smokes about 1 pack/day  Asked about quitting: confirms that she currently smokes cigarettes Advise to quit smoking: Educated about QUITTING to reduce the risk of cancer, cardio and cerebrovascular disease. Assess willingness: willing to quit at this time, but is  working on cutting back. Assist with counseling and pharmacotherapy: Counseled for 3 minutes and literature provided. Arrange for follow up: follow up in 1 month and continue to offer help.   - Delayed nicotine  patch initiation until blood pressure is better controlled. - Discussed potential for low dose chest CT scan for lung cancer screening but the patient declined      History of CVA (cerebrovascular accident)   On aspirin  81 mg daily, pravastatin  80 mg daily Need to get diabetes and hypertension under control discussed Smoking cessation discussed      Insomnia   Reports limited sleep. Hesitant about sleep medications. - Recommended trial of over-the-counter melatonin for sleep.             Abnormal weight loss   Wt Readings from Last 3 Encounters:  08/05/24 143 lb (64.9 kg)  07/29/24 144 lb (65.3 kg)  02/11/24 150 lb (68 kg)   Unintentional Weight Loss Reports weight loss and decreased appetite. - Encouraged consumption of healthy foods that she enjoys. Cologuard ordered to screen for colon cancer up-to-date with mammogram declined lung cancer screening      Other Visit Diagnoses       Screening for colon cancer    -  Primary   Relevant Orders   Cologuard       Meds ordered this encounter  Medications  . nitroGLYCERIN  (NITROSTAT ) 0.4 MG SL tablet    Sig: Place 1 tablet (0.4 mg total) under the tongue every 5 (five) minutes as needed. Call 911 if no improvement after 3rd tab    Dispense:  30 tablet    Refill:  0  . glimepiride  (AMARYL ) 4 MG tablet    Sig: Take 1 tablet (4 mg total) by mouth daily with breakfast.    Dispense:  90 tablet    Refill:  0    Follow-up: Return in about 4 weeks (around 09/02/2024) for HTN.    Shynia Daleo R Ionna Avis, FNP

## 2024-08-05 NOTE — Assessment & Plan Note (Signed)
 Coronary Artery Disease No recent chest pain. Emphasized regular cardiologist follow-up. - Refilled nitroglycerin  prescription. Continue aspirin  81 mg daily, pravastatin  80 mg daily, checking direct LDL - Encouraged cardiologist follow-up.

## 2024-08-06 LAB — CBC
Hematocrit: 41.2 % (ref 34.0–46.6)
Hemoglobin: 13.2 g/dL (ref 11.1–15.9)
MCH: 27.8 pg (ref 26.6–33.0)
MCHC: 32 g/dL (ref 31.5–35.7)
MCV: 87 fL (ref 79–97)
Platelets: 182 x10E3/uL (ref 150–450)
RBC: 4.75 x10E6/uL (ref 3.77–5.28)
RDW: 13.7 % (ref 11.7–15.4)
WBC: 5.4 x10E3/uL (ref 3.4–10.8)

## 2024-08-06 LAB — CMP14+EGFR
ALT: 9 IU/L (ref 0–32)
AST: 11 IU/L (ref 0–40)
Albumin: 4.3 g/dL (ref 3.9–4.9)
Alkaline Phosphatase: 84 IU/L (ref 49–135)
BUN/Creatinine Ratio: 4 — ABNORMAL LOW (ref 12–28)
BUN: 5 mg/dL — ABNORMAL LOW (ref 8–27)
Bilirubin Total: 0.5 mg/dL (ref 0.0–1.2)
CO2: 32 mmol/L — ABNORMAL HIGH (ref 20–29)
Calcium: 9.8 mg/dL (ref 8.7–10.3)
Chloride: 98 mmol/L (ref 96–106)
Creatinine, Ser: 1.23 mg/dL — ABNORMAL HIGH (ref 0.57–1.00)
Globulin, Total: 2.9 g/dL (ref 1.5–4.5)
Glucose: 108 mg/dL — ABNORMAL HIGH (ref 70–99)
Potassium: 3.2 mmol/L — ABNORMAL LOW (ref 3.5–5.2)
Sodium: 143 mmol/L (ref 134–144)
Total Protein: 7.2 g/dL (ref 6.0–8.5)
eGFR: 48 mL/min/1.73 — ABNORMAL LOW (ref >59)

## 2024-08-06 LAB — LDL CHOLESTEROL, DIRECT: LDL Direct: 78 mg/dL (ref 0–99)

## 2024-08-08 ENCOUNTER — Other Ambulatory Visit: Payer: Self-pay | Admitting: Nurse Practitioner

## 2024-08-08 ENCOUNTER — Telehealth: Payer: Self-pay

## 2024-08-08 ENCOUNTER — Ambulatory Visit: Payer: Self-pay | Admitting: Nurse Practitioner

## 2024-08-08 DIAGNOSIS — E785 Hyperlipidemia, unspecified: Secondary | ICD-10-CM

## 2024-08-08 DIAGNOSIS — E876 Hypokalemia: Secondary | ICD-10-CM

## 2024-08-08 MED ORDER — POTASSIUM CHLORIDE CRYS ER 20 MEQ PO TBCR: 40.0000 meq | EXTENDED_RELEASE_TABLET | Freq: Once | ORAL | 0 refills | Status: DC

## 2024-08-08 MED ORDER — EZETIMIBE 10 MG PO TABS: 10.0000 mg | ORAL_TABLET | Freq: Every day | ORAL | 3 refills | Status: AC

## 2024-08-08 NOTE — Progress Notes (Unsigned)
 Care Guide Pharmacy Note  08/08/2024 Name: Amber Suarez MRN: [id number] DOB: 26-Feb-1956  Referred By: Paseda, Folashade R, FNP Reason for referral: Complex Care Management and Call Attempt #1 (Unsuccessful initial outreach to schedule with PHARM D- Layna)   Amber Suarez is a 68 y.o. year old female who is a primary care patient of Paseda, Folashade R, FNP.  Amber Suarez was referred to the pharmacist for assistance related to: Hypersomnia  An unsuccessful telephone outreach was attempted today to contact the patient who was referred to the pharmacy team for assistance with medication assistance. Additional attempts will be made to contact the patient.  Amber Suarez [facility], [facility] Guide  Direct Dial: [phone]  Fax [phone]

## 2024-08-09 NOTE — Progress Notes (Unsigned)
 Care Guide Pharmacy Note  08/09/2024 Name: Amber Suarez MRN: [id number] DOB: [birth year]  Referred By: Paseda, Folashade R, FNP Reason for referral: Complex Care Management, Call Attempt #1 (Unsuccessful initial outreach to schedule with PHARM DGLENWOOD Bun), and Call Attempt #2 (Unsuccessful initial outreach to schedule with PHARM D- Layna/)   Amber Suarez is a 68 y.o. year old female who is a primary care patient of Paseda, Folashade R, FNP.  Amber Suarez was referred to the pharmacist for assistance related to: hypersomnia  A second unsuccessful telephone outreach was attempted today to contact the patient who was referred to the pharmacy team for assistance with medication assistance. Additional attempts will be made to contact the patient.  Leotis Rase [facility], [facility] Guide  Direct Dial: [phone]  Fax [phone]

## 2024-08-10 NOTE — Progress Notes (Signed)
 Care Guide Pharmacy Note  08/10/2024 Name: Amber Suarez MRN: [id number] DOB: 07/28/1956  Referred By: Paseda, Folashade R, FNP Reason for referral: Complex Care Management, Call Attempt #1 (Unsuccessful initial outreach to schedule with PHARM DGLENWOOD Bun), Call Attempt #2 (Unsuccessful initial outreach to schedule with PHARM D- Layna/), and Call Attempt #3 (Unsuccessful initial outreach to schedule with PHARM D- Layna//)   Amber Suarez is a 68 y.o. year old female who is a primary care patient of Paseda, Folashade R, FNP.  Amber Suarez was referred to the pharmacist for assistance related un:[id number]   A third unsuccessful telephone outreach was attempted today to contact the patient who was referred to the pharmacy team for assistance with medication assistance. The Population Health team is pleased to engage with this patient at any time in the future upon receipt of referral and should he/she be interested in assistance from the [facility] team.  Leotis Rase [facility]  [facility], [facility] Guide  Direct Dial: [phone]  Fax [phone]

## 2024-08-17 ENCOUNTER — Other Ambulatory Visit: Payer: Self-pay

## 2024-08-17 DIAGNOSIS — E876 Hypokalemia: Secondary | ICD-10-CM | POA: Diagnosis not present

## 2024-08-18 LAB — POTASSIUM: Potassium: 3.3 mmol/L — ABNORMAL LOW (ref 3.5–5.2)

## 2024-08-19 ENCOUNTER — Other Ambulatory Visit: Payer: Self-pay | Admitting: Nurse Practitioner

## 2024-08-19 ENCOUNTER — Ambulatory Visit: Payer: Self-pay | Admitting: Nurse Practitioner

## 2024-08-19 DIAGNOSIS — E876 Hypokalemia: Secondary | ICD-10-CM

## 2024-08-19 MED ORDER — POTASSIUM CHLORIDE CRYS ER 20 MEQ PO TBCR: 20.0000 meq | EXTENDED_RELEASE_TABLET | Freq: Every day | ORAL | 0 refills | Status: AC

## 2024-08-22 LAB — MAGNESIUM: Magnesium: 1.9 mg/dL (ref 1.6–2.3)

## 2024-08-22 LAB — SPECIMEN STATUS REPORT

## 2024-08-30 ENCOUNTER — Ambulatory Visit (INDEPENDENT_AMBULATORY_CARE_PROVIDER_SITE_OTHER): Payer: Self-pay | Admitting: Nurse Practitioner

## 2024-08-30 ENCOUNTER — Encounter: Payer: Self-pay | Admitting: Nurse Practitioner

## 2024-08-30 VITALS — BP 116/78 | HR 83 | Wt 144.0 lb

## 2024-08-30 DIAGNOSIS — Z780 Asymptomatic menopausal state: Secondary | ICD-10-CM

## 2024-08-30 DIAGNOSIS — Z8673 Personal history of transient ischemic attack (TIA), and cerebral infarction without residual deficits: Secondary | ICD-10-CM

## 2024-08-30 DIAGNOSIS — I10 Essential (primary) hypertension: Secondary | ICD-10-CM

## 2024-08-30 DIAGNOSIS — Z122 Encounter for screening for malignant neoplasm of respiratory organs: Secondary | ICD-10-CM

## 2024-08-30 DIAGNOSIS — Z0000 Encounter for general adult medical examination without abnormal findings: Secondary | ICD-10-CM | POA: Insufficient documentation

## 2024-08-30 DIAGNOSIS — E876 Hypokalemia: Secondary | ICD-10-CM

## 2024-08-30 DIAGNOSIS — I2510 Atherosclerotic heart disease of native coronary artery without angina pectoris: Secondary | ICD-10-CM

## 2024-08-30 DIAGNOSIS — Z1231 Encounter for screening mammogram for malignant neoplasm of breast: Secondary | ICD-10-CM

## 2024-08-30 DIAGNOSIS — F17200 Nicotine dependence, unspecified, uncomplicated: Secondary | ICD-10-CM

## 2024-08-30 DIAGNOSIS — E785 Hyperlipidemia, unspecified: Secondary | ICD-10-CM

## 2024-08-30 MED ORDER — NICOTINE 21 MG/24HR TD PT24: 21.0000 mg | MEDICATED_PATCH | Freq: Every day | TRANSDERMAL | 0 refills | Status: AC

## 2024-08-30 MED ORDER — NICOTINE 14 MG/24HR TD PT24: 14.0000 mg | MEDICATED_PATCH | Freq: Every day | TRANSDERMAL | 0 refills | Status: AC

## 2024-08-30 NOTE — Patient Instructions (Signed)
 Please call [phone]   to schedule your mammogram.  [facility]. [address] [place], [place] [address]. [place] .     For smoking cessation take nicotine  patch begriming  with step 1 (21 mg/day) for 6 weeks, followed by step 2 (14 mg/day) for 2 weeks; let me know when you are out of this medication  1. Hypokalemia (Primary) - Potassium  2. Screening for lung cancer - CT CHEST LUNG CA SCREEN LOW DOSE W/O CM  3. Post-menopausal - DG Bone Density; Future  4. Screening mammogram for breast cancer - MM 3D SCREENING MAMMOGRAM BILATERAL BREAST; Future  5. Tobacco use disorder - nicotine  (NICODERM CQ  - DOSED IN MG/24 HOURS) 21 mg/24hr patch; Place 1 patch (21 mg total) onto the skin daily.  Dispense: 42 patch; Refill: 0 - nicotine  (NICODERM CQ  - DOSED IN MG/24 HOURS) 14 mg/24hr patch; Place 1 patch (14 mg total) onto the skin daily.  Dispense: 14 patch; Refill: 0

## 2024-08-30 NOTE — Assessment & Plan Note (Signed)
°  LDL cholesterol needs to be less than 55 mg/dL due to cardiovascular history. - Continue pravastatin  80 mg daily, Zetia  10 mg daily - Advised on dietary modifications to reduce LDL cholesterol. - Will recheck cholesterol levels at next visit.

## 2024-08-30 NOTE — Assessment & Plan Note (Addendum)
 Continue aspirin  81 mg daily, pravastatin  80 mg daily Smoking cessation encouraged Will recheck LDL at next visit

## 2024-08-30 NOTE — Assessment & Plan Note (Signed)
 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 - Checked potassium levels today. - Continue potassium 20 mEq daily

## 2024-08-30 NOTE — Progress Notes (Signed)
 Established Patient Office Visit  Subjective:  Patient ID: Amber Suarez, female    DOB: 1956/07/16  Age: 68 y.o. MRN: [id number]  CC:  Chief Complaint  Patient presents with   medication follow up      Discussed the use of AI scribe software for clinical note transcription with the patient, who gave verbal consent to proceed.  History of Present Illness Amber Suarez is a 68 year old female  has a past medical history of CHF (congestive heart failure) (HCC), Diabetes mellitus, Heart murmur, Hyperlipidemia, Hypertension, Myocardial infarction [facility]), and Stroke (HCC).   who presents for routine follow-up.  Her recent blood pressure reading was 116/78 mmHg. She adheres to her medication regimen and does not use much salt in her diet, despite having two boxes of salt at home.  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 She takes a potassium supplement, and her levels will be rechecked today.  Her A1c was noted to be 7.0. Her cholesterol levels were discussed in the context of her history of coronary artery disease and stroke.  She smokes one pack of cigarettes per day and has previously discussed using a nicotine  patch to aid in smoking cessation.  Low-dose chest CT scan for lung cancer screening and a bone density scan were previously ordered but have not yet been completed.  No chest pain, shortness of breath, or swelling in her legs.    Assessment & Plan      Past Medical History:  Diagnosis Date   CHF (congestive heart failure) (HCC)    Diabetes mellitus    Heart murmur    Hyperlipidemia    Hypertension    Myocardial infarction [facility])    Stroke [facility])     Past Surgical History:  Procedure Laterality Date   ABDOMINAL HYSTERECTOMY     angoiplasty     LEFT HEART CATHETERIZATION WITH CORONARY ANGIOGRAM N/A 01/28/2012   Procedure: LEFT HEART CATHETERIZATION WITH CORONARY ANGIOGRAM;  Surgeon: Alm LELON Clay,  MD;  Location: [facility] CATH LAB;  Service: Cardiovascular;  Laterality: N/A;   repair of aneursym  1997    Family History  Problem Relation Age of Onset   Diabetes Mother    Hypertension Mother    Hypertension Father    Hypertension Sister    Breast cancer Neg Hx     Social History   Socioeconomic History   Marital status: Divorced    Spouse name: Not on file   Number of children: Not on file   Years of education: Not on file   Highest education level: Not on file  Occupational History   Not on file  Tobacco Use   Smoking status: Every Day    Current packs/day: 0.50    Average packs/day: 0.5 packs/day for 40.0 years (20.0 ttl pk-yrs)    Types: Cigarettes   Smokeless tobacco: Never  Vaping Use   Vaping status: Never Used  Substance and Sexual Activity   Alcohol use: No   Drug use: No   Sexual activity: Not Currently    Birth control/protection: Surgical  Other Topics Concern   Not on file  Social History Narrative   Lives with her boyfriend    Social Drivers of Health   Tobacco Use: High Risk (08/30/2024)   Patient History    Smoking Tobacco Use: Every Day    Smokeless Tobacco Use: Never    Passive Exposure: Not on file  Financial Resource Strain: Low Risk (  02/11/2024)   Overall Financial Resource Strain (CARDIA)    Difficulty of Paying Living Expenses: Not hard at all  Food Insecurity: Unknown (02/11/2024)   Hunger Vital Sign    Worried About Running Out of Food in the Last Year: Not on file    Ran Out of Food in the Last Year: Never true  Transportation Needs: No Transportation Needs (02/11/2024)   PRAPARE - Administrator, Civil Service (Medical): No    Lack of Transportation (Non-Medical): No  Physical Activity: Insufficiently Active (02/11/2024)   Exercise Vital Sign    Days of Exercise per Week: 3 days    Minutes of Exercise per Session: 30 min  Stress: No Stress Concern Present (02/11/2024)   [employer] of Occupational Health -  Occupational Stress Questionnaire    Feeling of Stress : Not at all  Social Connections: Socially Integrated (02/11/2024)   Social Connection and Isolation Panel    Frequency of Communication with Friends and Family: More than three times a week    Frequency of Social Gatherings with Friends and Family: Once a week    Attends Religious Services: More than 4 times per year    Active Member of [employer] or Organizations: No    Attends Engineer, Structural: More than 4 times per year    Marital Status: Married  Catering Manager Violence: Not At Risk (02/11/2024)   Humiliation, Afraid, Rape, and Kick questionnaire    Fear of Current or Ex-Partner: No    Emotionally Abused: No    Physically Abused: No    Sexually Abused: No  Depression (PHQ2-9): Low Risk (08/30/2024)   Depression (PHQ2-9)    PHQ-2 Score: 0  Alcohol Screen: Low Risk (02/11/2024)   Alcohol Screen    Last Alcohol Screening Score (AUDIT): 0  Housing: Low Risk (02/11/2024)   Housing Stability Vital Sign    Unable to Pay for Housing in the Last Year: No    Number of Times Moved in the Last Year: 0    Homeless in the Last Year: No  Utilities: Not At Risk (02/11/2024)   [facility]HC Utilities    Threatened with loss of utilities: No  Health Literacy: Adequate Health Literacy (02/11/2024)   B1300 Health Literacy    Frequency of need for help with medical instructions: Never    Outpatient Medications Prior to Visit  Medication Sig Dispense Refill   amLODipine  (NORVASC ) 10 MG tablet Take 1 tablet (10 mg total) by mouth daily. 90 tablet 1   aspirin  EC 81 MG tablet Take 1 tablet (81 mg total) by mouth daily. Swallow whole. 90 tablet 3   Blood Pressure Monitoring DEVI 1 each by Does not apply route daily. 1 Device 0   ezetimibe  (ZETIA ) 10 MG tablet Take 1 tablet (10 mg total) by mouth daily. 90 tablet 3   glimepiride  (AMARYL ) 4 MG tablet Take 1 tablet (4 mg total) by mouth daily with breakfast. 90 tablet 0   isosorbide  mononitrate  (IMDUR ) 30 MG 24 hr tablet Take 1 tablet (30 mg total) by mouth daily. 90 tablet 1   lisinopril  (ZESTRIL ) 20 MG tablet Take 1 tablet (20 mg total) by mouth daily. 90 tablet 1   nitroGLYCERIN  (NITROSTAT ) 0.4 MG SL tablet Place 1 tablet (0.4 mg total) under the tongue every 5 (five) minutes as needed. Call 911 if no improvement after 3rd tab 30 tablet 0   potassium chloride  SA (KLOR-CON  M) 20 MEQ tablet Take 1 tablet (20 mEq total)  by mouth daily. 15 tablet 0   pravastatin  (PRAVACHOL ) 80 MG tablet Take 1 tablet (80 mg total) by mouth daily. 90 tablet 1   spironolactone  (ALDACTONE ) 100 MG tablet Take 1 tablet (100 mg total) by mouth 2 (two) times daily. 180 tablet 1   No facility-administered medications prior to visit.    Allergies[1]  ROS Review of Systems  Constitutional:  Negative for appetite change, chills, fatigue and fever.  HENT:  Negative for congestion, postnasal drip, rhinorrhea and sneezing.   Respiratory:  Negative for cough, shortness of breath and wheezing.   Cardiovascular:  Negative for chest pain, palpitations and leg swelling.  Gastrointestinal:  Negative for abdominal pain, constipation, nausea and vomiting.  Genitourinary:  Negative for difficulty urinating, dysuria, flank pain and frequency.  Musculoskeletal:  Negative for arthralgias, back pain, joint swelling and myalgias.  Skin:  Negative for color change, pallor, rash and wound.  Neurological:  Negative for dizziness, facial asymmetry, weakness, numbness and headaches.  Psychiatric/Behavioral:  Negative for behavioral problems, confusion, self-injury and suicidal ideas.       Objective:    Physical Exam Vitals and nursing note reviewed.  Constitutional:      General: She is not in acute distress.    Appearance: Normal appearance. She is not ill-appearing, toxic-appearing or diaphoretic.  Eyes:     General: No scleral icterus.       Right eye: No discharge.        Left eye: No discharge.     Extraocular  Movements: Extraocular movements intact.     Conjunctiva/sclera: Conjunctivae normal.  Cardiovascular:     Rate and Rhythm: Normal rate and regular rhythm.     Pulses: Normal pulses.     Heart sounds: Normal heart sounds. No murmur heard.    No friction rub. No gallop.  Pulmonary:     Effort: Pulmonary effort is normal. No respiratory distress.     Breath sounds: Normal breath sounds. No stridor. No wheezing, rhonchi or rales.  Chest:     Chest wall: No tenderness.  Abdominal:     General: There is no distension.     Palpations: Abdomen is soft.     Tenderness: There is no abdominal tenderness. There is no right CVA tenderness, left CVA tenderness or guarding.  Musculoskeletal:        General: No swelling, tenderness, deformity or signs of injury.     Right lower leg: No edema.     Left lower leg: No edema.  Skin:    General: Skin is warm and dry.     Capillary Refill: Capillary refill takes less than 2 seconds.     Coloration: Skin is not jaundiced or pale.     Findings: No bruising or erythema.  Neurological:     Mental Status: She is alert and oriented to person, place, and time.     Motor: No weakness.     Gait: Gait normal.  Psychiatric:        Mood and Affect: Mood normal.        Behavior: Behavior normal.        Thought Content: Thought content normal.        Judgment: Judgment normal.     BP 116/78 (BP Location: Left Arm, Patient Position: Sitting, Cuff Size: Normal)   Pulse 83   Wt 144 lb (65.3 kg)   SpO2 100%   BMI 20.66 kg/m  Wt Readings from Last 3 Encounters:  08/30/24 144 lb (65.3 kg)  08/05/24 143 lb (64.9 kg)  07/29/24 144 lb (65.3 kg)    Lab Results  Component Value Date   TSH 1.15 01/16/2017   Lab Results  Component Value Date   WBC 5.4 08/05/2024   HGB 13.2 08/05/2024   HCT 41.2 08/05/2024   MCV 87 08/05/2024   PLT 182 08/05/2024   Lab Results  Component Value Date   NA 143 08/05/2024   K 3.3 (L) 08/17/2024   CO2 32 (H) 08/05/2024    GLUCOSE 108 (H) 08/05/2024   BUN 5 (L) 08/05/2024   CREATININE 1.23 (H) 08/05/2024   BILITOT 0.5 08/05/2024   ALKPHOS 84 08/05/2024   AST 11 08/05/2024   ALT 9 08/05/2024   PROT 7.2 08/05/2024   ALBUMIN 4.3 08/05/2024   CALCIUM  9.8 08/05/2024   ANIONGAP 10 05/04/2020   EGFR 48 (L) 08/05/2024   GFR 68.87 06/15/2017   Lab Results  Component Value Date   CHOL 140 09/23/2022   Lab Results  Component Value Date   HDL 33 (L) 09/23/2022   Lab Results  Component Value Date   LDLCALC 90 09/23/2022   Lab Results  Component Value Date   TRIG 91 09/23/2022   Lab Results  Component Value Date   CHOLHDL 4.2 09/23/2022   Lab Results  Component Value Date   HGBA1C 7.0 (A) 08/05/2024      Assessment & Plan:   Problem List Items Addressed This Visit       Cardiovascular and Mediastinum   HYPERTENSION, BENIGN ESSENTIAL   BP Readings from Last 3 Encounters:  08/30/24 116/78  08/05/24 (!) 159/95  07/29/24 (!) 169/98   HTN Controlled .Continue amlodipine  10 mg daily, lisinopril , 20 mg daily spironolactone  100 mg twice daily.  Isosorbide  mononitrate 30 mg daily  Continue current medications. No changes in management. Discussed DASH diet and dietary sodium restrictions Continue to increase dietary efforts and exercise.         CAD (coronary artery disease)   Continue pravastatin  80 mg daily, aspirin  81 mg daily Smoking cessation encouraged          Other   HLD (hyperlipidemia)    LDL cholesterol needs to be less than 55 mg/dL due to cardiovascular history. - Continue pravastatin  80 mg daily, Zetia  10 mg daily - Advised on dietary modifications to reduce LDL cholesterol. - Will recheck cholesterol levels at next visit.      TOBACCO DEPENDENCE   Smokes 1 pack of cigarettes daily Smoking cessation encouraged Nicotine  patch ordered      Relevant Medications   nicotine  (NICODERM CQ  - DOSED IN MG/24 HOURS) 21 mg/24hr patch   nicotine  (NICODERM CQ  - DOSED IN MG/24  HOURS) 14 mg/24hr patch (Start on 10/11/2024)   History of CVA (cerebrovascular accident)   Continue aspirin  81 mg daily, pravastatin  80 mg daily Smoking cessation encouraged Will recheck LDL at next visit      Hypokalemia - Primary   Lab Results  Component Value Date   NA 143 08/05/2024   K 3.3 (L) 08/17/2024   CO2 32 (H) 08/05/2024   GLUCOSE 108 (H) 08/05/2024   BUN 5 (L) 08/05/2024   CREATININE 1.23 (H) 08/05/2024   CALCIUM  9.8 08/05/2024   GFR 68.87 06/15/2017   EGFR 48 (L) 08/05/2024   GFRNONAA 46 (L) 10/30/2020   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 - Checked potassium levels today. - Continue potassium 20 mEq daily       Relevant Orders  Potassium   Screening for lung cancer   Relevant Orders   CT CHEST LUNG CA SCREEN LOW DOSE W/O CM   Health care maintenance    Routine health maintenance screenings are due. - Ordered low dose chest CT scan for lung cancer screening. - Ordered bone density scan. - Ensured mammogram is scheduled. - Confirmed Cologuard test was completed.       Other Visit Diagnoses       Post-menopausal       Relevant Orders   DG Bone Density     Screening mammogram for breast cancer       Relevant Orders   MM 3D SCREENING MAMMOGRAM BILATERAL BREAST       Meds ordered this encounter  Medications   nicotine  (NICODERM CQ  - DOSED IN MG/24 HOURS) 21 mg/24hr patch    Sig: Place 1 patch (21 mg total) onto the skin daily.    Dispense:  42 patch    Refill:  0   nicotine  (NICODERM CQ  - DOSED IN MG/24 HOURS) 14 mg/24hr patch    Sig: Place 1 patch (14 mg total) onto the skin daily.    Dispense:  14 patch    Refill:  0    Follow-up: Return in about 10 weeks (around 11/08/2024) for HTN, DM, HYPERLIPIDEMIA.    Shakora Nordquist R Nira Visscher, FNP     [1]  Allergies Allergen Reactions   Morphine And Codeine Itching and Other (See Comments)    burning

## 2024-08-30 NOTE — Assessment & Plan Note (Signed)
 Smokes 1 pack of cigarettes daily Smoking cessation encouraged Nicotine  patch ordered

## 2024-08-30 NOTE — Assessment & Plan Note (Signed)
 BP Readings from Last 3 Encounters:  08/30/24 116/78  08/05/24 (!) 159/95  07/29/24 (!) 169/98   HTN Controlled .Continue amlodipine  10 mg daily, lisinopril , 20 mg daily spironolactone  100 mg twice daily.  Isosorbide  mononitrate 30 mg daily  Continue current medications. No changes in management. Discussed DASH diet and dietary sodium restrictions Continue to increase dietary efforts and exercise.

## 2024-08-30 NOTE — Assessment & Plan Note (Addendum)
°  Routine health maintenance screenings are due. - Ordered low dose chest CT scan for lung cancer screening. - Ordered bone density scan. - Ensured mammogram is scheduled. - Confirmed Cologuard test was completed.

## 2024-08-30 NOTE — Assessment & Plan Note (Signed)
 Continue pravastatin  80 mg daily, aspirin  81 mg daily Smoking cessation encouraged

## 2024-08-31 ENCOUNTER — Ambulatory Visit: Payer: Self-pay | Admitting: Nurse Practitioner

## 2024-08-31 LAB — POTASSIUM: Potassium: 4.7 mmol/L (ref 3.5–5.2)

## 2024-09-04 LAB — COLOGUARD: COLOGUARD: NEGATIVE

## 2024-09-27 ENCOUNTER — Ambulatory Visit: Admitting: Nurse Practitioner

## 2024-11-04 ENCOUNTER — Ambulatory Visit: Payer: Self-pay | Admitting: Nurse Practitioner

## 2024-11-08 ENCOUNTER — Ambulatory Visit: Payer: Self-pay | Admitting: Nurse Practitioner

## 2024-11-11 ENCOUNTER — Ambulatory Visit: Payer: Self-pay | Admitting: Nurse Practitioner
